# Patient Record
Sex: Male | Born: 1975 | Race: White | Hispanic: No | Marital: Married | Smoking: Former smoker
Health system: Southern US, Community
[De-identification: ages and names within clinical notes are randomized; demographics above are authoritative.]

## PROBLEM LIST (undated history)

## (undated) DIAGNOSIS — K4090 Unilateral inguinal hernia, without obstruction or gangrene, not specified as recurrent: Secondary | ICD-10-CM

## (undated) DIAGNOSIS — Z972 Presence of dental prosthetic device (complete) (partial): Secondary | ICD-10-CM

## (undated) DIAGNOSIS — K08109 Complete loss of teeth, unspecified cause, unspecified class: Secondary | ICD-10-CM

## (undated) DIAGNOSIS — F419 Anxiety disorder, unspecified: Secondary | ICD-10-CM

## (undated) DIAGNOSIS — M549 Dorsalgia, unspecified: Secondary | ICD-10-CM

## (undated) DIAGNOSIS — Z973 Presence of spectacles and contact lenses: Secondary | ICD-10-CM

## (undated) DIAGNOSIS — F319 Bipolar disorder, unspecified: Secondary | ICD-10-CM

## (undated) DIAGNOSIS — R7303 Prediabetes: Secondary | ICD-10-CM

## (undated) DIAGNOSIS — K219 Gastro-esophageal reflux disease without esophagitis: Secondary | ICD-10-CM

## (undated) DIAGNOSIS — R569 Unspecified convulsions: Secondary | ICD-10-CM

## (undated) HISTORY — PX: ABDOMINAL SURGERY: SHX537

---

## 2001-09-21 ENCOUNTER — Emergency Department (HOSPITAL_COMMUNITY): Admission: EM | Admit: 2001-09-21 | Discharge: 2001-09-21 | Payer: Self-pay | Admitting: Emergency Medicine

## 2002-12-30 ENCOUNTER — Emergency Department (HOSPITAL_COMMUNITY): Admission: EM | Admit: 2002-12-30 | Discharge: 2002-12-30 | Payer: Self-pay | Admitting: Emergency Medicine

## 2007-02-04 ENCOUNTER — Emergency Department (HOSPITAL_COMMUNITY): Admission: EM | Admit: 2007-02-04 | Discharge: 2007-02-04 | Payer: Self-pay | Admitting: Emergency Medicine

## 2007-03-20 ENCOUNTER — Emergency Department (HOSPITAL_COMMUNITY): Admission: EM | Admit: 2007-03-20 | Discharge: 2007-03-20 | Payer: Self-pay | Admitting: Emergency Medicine

## 2007-03-22 ENCOUNTER — Inpatient Hospital Stay (HOSPITAL_COMMUNITY): Admission: AD | Admit: 2007-03-22 | Discharge: 2007-03-29 | Payer: Self-pay | Admitting: Psychiatry

## 2007-03-22 ENCOUNTER — Ambulatory Visit: Payer: Self-pay | Admitting: Psychiatry

## 2007-03-23 ENCOUNTER — Other Ambulatory Visit: Payer: Self-pay | Admitting: Emergency Medicine

## 2007-06-19 ENCOUNTER — Emergency Department (HOSPITAL_COMMUNITY): Admission: EM | Admit: 2007-06-19 | Discharge: 2007-06-19 | Payer: Self-pay | Admitting: Emergency Medicine

## 2007-08-12 ENCOUNTER — Other Ambulatory Visit: Payer: Self-pay | Admitting: Emergency Medicine

## 2007-08-12 ENCOUNTER — Other Ambulatory Visit: Payer: Self-pay

## 2007-08-13 ENCOUNTER — Inpatient Hospital Stay (HOSPITAL_COMMUNITY): Admission: EM | Admit: 2007-08-13 | Discharge: 2007-08-16 | Payer: Self-pay | Admitting: *Deleted

## 2007-08-14 ENCOUNTER — Ambulatory Visit: Payer: Self-pay | Admitting: *Deleted

## 2007-09-11 ENCOUNTER — Inpatient Hospital Stay (HOSPITAL_COMMUNITY): Admission: AD | Admit: 2007-09-11 | Discharge: 2007-09-15 | Payer: Self-pay | Admitting: *Deleted

## 2007-09-11 ENCOUNTER — Other Ambulatory Visit: Payer: Self-pay

## 2007-09-11 ENCOUNTER — Other Ambulatory Visit: Payer: Self-pay | Admitting: Emergency Medicine

## 2007-09-28 HISTORY — PX: EXPLORATORY LAPAROTOMY: SUR591

## 2007-09-28 HISTORY — PX: ABDOMINAL SURGERY: SHX537

## 2007-10-23 ENCOUNTER — Emergency Department (HOSPITAL_COMMUNITY): Admission: EM | Admit: 2007-10-23 | Discharge: 2007-10-24 | Payer: Self-pay | Admitting: Emergency Medicine

## 2007-10-24 ENCOUNTER — Inpatient Hospital Stay (HOSPITAL_COMMUNITY): Admission: RE | Admit: 2007-10-24 | Discharge: 2007-10-27 | Payer: Self-pay | Admitting: *Deleted

## 2007-10-24 ENCOUNTER — Ambulatory Visit: Payer: Self-pay | Admitting: *Deleted

## 2007-12-26 ENCOUNTER — Emergency Department (HOSPITAL_COMMUNITY): Admission: EM | Admit: 2007-12-26 | Discharge: 2007-12-26 | Payer: Self-pay | Admitting: Emergency Medicine

## 2008-08-13 ENCOUNTER — Inpatient Hospital Stay (HOSPITAL_COMMUNITY): Admission: EM | Admit: 2008-08-13 | Discharge: 2008-08-19 | Payer: Self-pay | Admitting: Emergency Medicine

## 2008-09-16 ENCOUNTER — Ambulatory Visit: Payer: Self-pay | Admitting: Psychiatry

## 2008-09-16 ENCOUNTER — Inpatient Hospital Stay (HOSPITAL_COMMUNITY): Admission: AD | Admit: 2008-09-16 | Discharge: 2008-09-22 | Payer: Self-pay | Admitting: Psychiatry

## 2009-03-27 ENCOUNTER — Emergency Department (HOSPITAL_COMMUNITY): Admission: EM | Admit: 2009-03-27 | Discharge: 2009-03-27 | Payer: Self-pay | Admitting: Emergency Medicine

## 2009-04-18 ENCOUNTER — Emergency Department (HOSPITAL_COMMUNITY): Admission: EM | Admit: 2009-04-18 | Discharge: 2009-04-19 | Payer: Self-pay | Admitting: Emergency Medicine

## 2009-04-30 ENCOUNTER — Emergency Department (HOSPITAL_COMMUNITY): Admission: AC | Admit: 2009-04-30 | Discharge: 2009-05-01 | Payer: Self-pay

## 2009-05-01 ENCOUNTER — Ambulatory Visit: Payer: Self-pay | Admitting: Psychiatry

## 2009-05-01 ENCOUNTER — Inpatient Hospital Stay (HOSPITAL_COMMUNITY): Admission: AD | Admit: 2009-05-01 | Discharge: 2009-05-05 | Payer: Self-pay | Admitting: Psychiatry

## 2009-05-09 ENCOUNTER — Emergency Department (HOSPITAL_COMMUNITY): Admission: EM | Admit: 2009-05-09 | Discharge: 2009-05-09 | Payer: Self-pay | Admitting: Emergency Medicine

## 2010-05-24 ENCOUNTER — Emergency Department: Payer: Self-pay | Admitting: Internal Medicine

## 2010-06-04 ENCOUNTER — Emergency Department (HOSPITAL_COMMUNITY): Admission: EM | Admit: 2010-06-04 | Discharge: 2010-06-05 | Payer: Self-pay | Admitting: Emergency Medicine

## 2010-06-05 ENCOUNTER — Other Ambulatory Visit: Payer: Self-pay | Admitting: Emergency Medicine

## 2010-06-05 ENCOUNTER — Inpatient Hospital Stay (HOSPITAL_COMMUNITY): Admission: RE | Admit: 2010-06-05 | Discharge: 2010-06-09 | Payer: Self-pay | Admitting: Psychiatry

## 2010-06-05 ENCOUNTER — Ambulatory Visit: Payer: Self-pay | Admitting: Psychiatry

## 2010-11-07 ENCOUNTER — Emergency Department (HOSPITAL_COMMUNITY)
Admission: EM | Admit: 2010-11-07 | Discharge: 2010-11-07 | Disposition: A | Discharge status: Home / Self Care | Payer: Medicaid Other | Attending: Emergency Medicine | Admitting: Emergency Medicine

## 2010-11-07 DIAGNOSIS — K089 Disorder of teeth and supporting structures, unspecified: Secondary | ICD-10-CM | POA: Insufficient documentation

## 2010-11-07 DIAGNOSIS — F411 Generalized anxiety disorder: Secondary | ICD-10-CM | POA: Insufficient documentation

## 2010-11-07 DIAGNOSIS — F319 Bipolar disorder, unspecified: Secondary | ICD-10-CM | POA: Insufficient documentation

## 2010-11-07 DIAGNOSIS — G40909 Epilepsy, unspecified, not intractable, without status epilepticus: Secondary | ICD-10-CM | POA: Insufficient documentation

## 2010-12-10 LAB — BASIC METABOLIC PANEL
Chloride: 102 mEq/L (ref 96–112)
GFR calc Af Amer: >60 mL/min (ref >60)
GFR calc non Af Amer: >60 mL/min (ref >60)

## 2010-12-10 LAB — CBC
HCT: 43.9 % (ref 39.0–52.0)
HCT: 47.8 % (ref 39.0–52.0)
Hemoglobin: 15.3 g/dL (ref 13.0–17.0)
MCHC: 34.9 g/dL (ref 30.0–36.0)
MCHC: 35 g/dL (ref 30.0–36.0)
MCV: 96.2 fL (ref 78.0–100.0)
Platelets: 266 10*3/uL (ref 150–400)
RDW: 13.9 % (ref 11.5–15.5)

## 2010-12-10 LAB — RAPID URINE DRUG SCREEN, HOSP PERFORMED
Amphetamines: NOT DETECTED
Barbiturates: POSITIVE — AB
Benzodiazepines: POSITIVE — AB
Cocaine: NOT DETECTED
Cocaine: NOT DETECTED
Tetrahydrocannabinol: NOT DETECTED

## 2010-12-10 LAB — POCT I-STAT, CHEM 8
BUN: 13 mg/dL (ref 6–23)
Calcium, Ion: 1.15 mmol/L (ref 1.12–1.32)
Chloride: 103 mEq/L (ref 96–112)
Glucose, Bld: 77 mg/dL (ref 70–99)
Hemoglobin: 15.6 g/dL (ref 13.0–17.0)
TCO2: 26 mmol/L (ref 0–100)

## 2010-12-10 LAB — DIFFERENTIAL
Basophils Absolute: 0 10*3/uL (ref 0.0–0.1)
Basophils Relative: 0 % (ref 0–1)
Eosinophils Absolute: 0.2 10*3/uL (ref 0.0–0.7)
Lymphocytes Relative: 19 % (ref 12–46)
Lymphs Abs: 1.7 10*3/uL (ref 0.7–4.0)
Monocytes Absolute: 0.6 10*3/uL (ref 0.1–1.0)
Neutro Abs: 6.4 10*3/uL (ref 1.7–7.7)
Neutrophils Relative %: 72 % (ref 43–77)

## 2010-12-10 LAB — ETHANOL: Alcohol, Ethyl (B): <5 mg/dL (ref 0–10)

## 2011-01-02 LAB — CBC
HCT: 41.9 % (ref 39.0–52.0)
RBC: 4.51 MIL/uL (ref 4.22–5.81)
RDW: 13.1 % (ref 11.5–15.5)

## 2011-01-02 LAB — RAPID URINE DRUG SCREEN, HOSP PERFORMED
Amphetamines: NOT DETECTED
Tetrahydrocannabinol: NOT DETECTED

## 2011-01-02 LAB — BASIC METABOLIC PANEL
BUN: 3 mg/dL — ABNORMAL LOW (ref 6–23)
Calcium: 8.9 mg/dL (ref 8.4–10.5)
GFR calc non Af Amer: >60 mL/min (ref >60)
Sodium: 138 mEq/L (ref 135–145)

## 2011-02-09 NOTE — H&P (Signed)
[name], [name]:  [id number]   MEDICAL RECORD NO.:  [id number]          PATIENT TYPE:  IPS   LOCATION:  0305                          FACILITY:  [facility]   PHYSICIAN:  Jasmine Pang, M.D. DATE OF BIRTH:  21-Oct-1975   DATE OF ADMISSION:  10/24/2007  DATE OF DISCHARGE:                       PSYCHIATRIC ADMISSION ASSESSMENT   This a 35 year old male voluntarily admitted on October 24, 2007.   HISTORY OF PRESENT ILLNESS:  The patient reports feeling very depressed  with suicidal thoughts.  States he was going to kill himself with his  gun.  He does have access to guns.  He states that he has also been  drinking, history of some binge drinking with his last drink 2 days ago.  He states one of his current stressors is he is homeless, and he has had  some noncompliance with his medications.   PAST PSYCHIATRIC HISTORY:  The patient was here prior in December 2008  for similar-type symptoms.  He also reports a history of cutting.  He  sees Dr. Mady Haagensen for outpatient mental health services.   SOCIAL HISTORY:  A 35 year old separated male, has one child.  He  considers himself homeless at this time.  He is unemployed.   FAMILY HISTORY:  None.   ALCOHOL AND DRUG HISTORY:  The patient smokes, and alcohol habits as  described above.  Denies any drug use.   PRIMARY CARE Ivis Nicolson:  Dr. Herbert Moors.   MEDICAL PROBLEMS:  No known medical problems.   MEDICATIONS:  1. Depakote 1000 mg at bedtime.  2. Prozac 20 mg daily.  3. Trazodone 50 mg at bedtime.  4. Seroquel 50 every 4 hours p.r.n.   DRUG ALLERGIES:  BUSPAR, states he breaks out.   PHYSICAL EXAM:  The patient was assessed at [facility], which was  reviewed and indicates a healthy physical exam.  He did receive Ativan 2  mg and ibuprofen 600 mg in the ED.   LABORATORY DATA:  His hemoglobin 14.4 with a hematocrit of 40.8.  His  BMET is within normal limits.  Alcohol level less than 5.  Urinalysis is  negative.  Urine drug screen is negative.   His temperature is 98.1, 91 heart rate, 20 respirations, blood pressure  120/81.  This is a healthy-appearing male, sleepy, in the bed with the seizure  pads in place.  Speech is soft-spoken.  Had to be aroused to answer questions.  Thought  process:  He is endorsing suicidal thoughts and promises safety.  Denies  any hallucinations.  His answers are appropriate.  Cognitive function:  He seems aware of his self and situation.  Memory is fair.  Judgment and  insight are fair.   AXIS I:  1.  Mood disorder.  2.  Alcohol abuse.  AXIS II:  Deferred.  AXIS III:  No known medical problems.  AXIS IV:  Problems with housing, other psychosocial problems. occupation  and primary support group.  AXIS V:  Current is 35.   PLAN:  To detox the patient and work on relapse prevention.  We will  reinforce medication compliance.  We will obtain a Depakote level.  We  will discontinue his BuSpar as he reports an adverse reaction and will  make Seroquel p.r.n. at this time.  Case manager will assess his follow-  up and assess his living situation.  His tentative length of stay is 4-5  days.      Landry Corporal, N.P.      Jasmine Pang, M.D.  Electronically Signed    JO/MEDQ  D:  10/24/2007  T:  10/25/2007  Job:  [id number]
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
## 2011-02-09 NOTE — Consult Note (Signed)
NAMELOVETT, COFFIN               ACCOUNT NO.:  [id number]   MEDICAL RECORD NO.:  [id number]          PATIENT TYPE:  INP   LOCATION:  5003                         FACILITY:  [facility]   PHYSICIAN:  Antonietta Breach, M.D.  DATE OF BIRTH:  [birth year]   DATE OF CONSULTATION:  08/19/2008  DATE OF DISCHARGE:  08/19/2008                                 CONSULTATION   [name] is no longer having suicidal thoughts.  His mood has  returned to normal.  He has intact hope and future interests.  He is not  having any thoughts of harming others.  He has no hallucinations.   His orientation and memory function are intact.  He is cooperative with  bedside care and socially appropriate.   REVIEW OF SYSTEMS:  SKIN:  No rash from Lamictal.  GASTROINTESTINAL:  No  nausea from trazodone.   EXAMINATION:  VITAL SIGNS:  Temperature 98.0, pulse 82, respiratory rate  19, blood pressure 98/60, O2 saturation on room air 98%.   MENTAL STATUS EXAMINATION:  [name] is alert.  He is oriented to all  spheres.  His eye contact is good.  His concentration is normal.  His  affect is broad and appropriate.  Mood within normal limits.  Speech  involves normal rate and prosody without dysarthria.  Thought process is  logical, coherent, and goal-directed.  No looseness of association.  Thought content:  No thoughts of harming himself or others, no  delusions, no hallucinations.  Insight is intact, judgment is intact.   ASSESSMENT:  Axis I:  A.  293.83 Mood disorder, not otherwise specified.  B.  Rule out 296.80.  He does have bipolar disorder and when he ran of  the out of Klonopin his mood destabilized.  The Klonopin does help the  Lamictal in mood stabilization.  C.  293.84 Anxiety disorder, not otherwise specified, stable.   [name] is no longer at risk to harm himself after receiving  supportive care in the hospital.   The undersigned did recommend a voluntary admission to the psychiatric  hospital to  provide the most intensive care.  However, the patient  declined and he is no longer committable.   He is motivated for outpatient care.   RECOMMENDATION:  Regarding his medication, would continue on his  Lamictal and Klonopin dosage as his mood stabilizing regimen, monitoring  for any rash as a side effect of the Lamictal.  Would continue his  trazodone 150 mg q.h.s., the dose of Lamictal is 100 mg b.i.d., the dose  of Klonopin is 1 mg t.i.d.   [name] agrees to not drive if drowsy.  He agrees to call emergency  services immediately for any thoughts of harming himself or others, or  distress.   Would ask the social worker to set up psychiatric outpatient followup  for [name] as soon as possible after discharge.      Antonietta Breach, M.D.  Electronically Signed     JW/MEDQ  D:  11/30/2008  T:  11/30/2008  Job:  [id number]

## 2011-02-09 NOTE — Consult Note (Signed)
NAMESHEIKH, LEVERICH               ACCOUNT NO.:  [id number]   MEDICAL RECORD NO.:  [id number]          PATIENT TYPE:  INP   LOCATION:  5003                         FACILITY:  [facility]   PHYSICIAN:  Antonietta Breach, M.D.  DATE OF BIRTH:  03/28/1976   DATE OF CONSULTATION:  08/16/2008  DATE OF DISCHARGE:                                 CONSULTATION   [name] is continuing with mood lability and suicidal intent.  He is  very anxious.  He is not having hallucinations or delusions.  He is  cooperative with bedside care.   REVIEW OF SYSTEMS:  PSYCHIATRIC And SKIN:  [name] does have a  history of a trial on Remeron with a rash.  Prozac caused him to be very  irritable.   EXAMINATION:  VITAL SIGNS:  Temperature 98.5, pulse 87, respiratory rate  16, blood pressure 121/70, O2 saturation on room air 95%.   [name]' psychotropic agents are currently Klonopin 1 mg t.i.d.,  Lamictal 100 mg b.i.d. and Desyrel 150 mg every night.   MENTAL STATUS EXAM:  [name] has labile mood.  His affect is labile.  He is not combative.  His attention span is mildly decreased.  His eye  contact is intact.  Concentration mildly decreased.  He is oriented to  all spheres.  Memory is intact to immediate, recent and remote.  His  speech involves slight pressure at times.  There is no dysarthria.  Thought process is coherent.  Thought content:  Please see the history  above.  Insight is partial.  Judgment is impaired for functioning  outside the hospital.  However, it is intact for the capacity for  informed consent regarding psychiatric care.   ASSESSMENT:  AXIS I:  293.83 mood disorder not otherwise specified.  Rule out 296.80 bipolar disorder not otherwise specified.  293.84 anxiety disorder not otherwise specified.   [name] does continue to be at risk for suicide.   RECOMMENDATIONS:  1. Continue suicide precautions.  2. Would admit to an inpatient psychiatric unit as soon as possible.  3. Low-stimulation ego support.  4. Continue his psychotropic medication as above, monitoring for any      excessive sedation due to the Klonopin and Desyrel.      Antonietta Breach, M.D.  Electronically Signed    JW/MEDQ  D:  08/19/2008  T:  08/19/2008  Job:  [id number]
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
## 2011-02-09 NOTE — H&P (Signed)
[name], [name]:  [id number]   MEDICAL RECORD NO.:  [id number]          PATIENT TYPE:  IPS   LOCATION:  0600                          FACILITY:  [facility]   PHYSICIAN:  Jasmine Pang, M.D. DATE OF BIRTH:  08-09-1976   DATE OF ADMISSION:  09/11/2007  DATE OF DISCHARGE:                       PSYCHIATRIC ADMISSION ASSESSMENT   IDENTIFYING INFORMATION:  A 35 year old married white male who was  admitted on a voluntary basis on September 12, 2007.   HISTORY OF PRESENT ILLNESS:  The patient states he was having suicidal  thoughts with the plan.  He presented to [facility] ED with the chief  complaint of suicidal ideation.  He reports he has had suicidal ideation  for the past 2-3 weeks since he ran out of his Lamictal.  He states his  suicidal ideation has worsened over the past 2-3 days.  He reports  episodes of blacking out.  He states he cannot remember some of the  things he does.  He reports increased depression recently.  He has not  been able to see his son, which has his increased his level of  depression.  He is unsure of the custody situation regarding his son.  He currently lives with the patient's parents.  He had begun to have  thoughts of cutting himself and then decided he would use something to  cut himself to death.  This is the second [facility] admission to this  facility.  The patient was here 1 month ago.  He has also been at rehab  at [facility] approximately 4 years ago.  He sees Dr. Mady [name] at [facility], that is the past psychiatric history starting when  the patient was here 1 month ago.   SOCIAL HISTORY:  The patient is married.  He has one son and two  stepsons.  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  He  states his wife is supportive.  His biologic son lives with his parents.   FAMILY HISTORY:  The patient denies family history other than mild  anxiety with father.   SUBSTANCE ABUSE:  Positive alcohol  use, a fifth per day.  He denies  current street drugs or other drug abuse.  No IV drug use.   MEDICAL PROBLEMS:  The patient has a seizure disorder.  He denies any  others.   MEDICATIONS:  Lamictal 200 mg p.o. b.i.d.  He was on the clonidine  patch, but states this did not help him.   DRUG ALLERGIES:  IBUPROFEN AND ZYPREXA (CAUSES RASH).   PHYSICAL FINDINGS:  Complete physical exam was done in the ED prior to  admission and there were no acute physical or medical problems noted.   ADMISSION LABORATORIES:  Urinalysis was negative.  Urine drug screen was  negative.  CBC was within normal limits.  Alcohol level was less than 5.  Basic metabolic panel was within normal limits.   MENTAL STATUS EXAM:  The patient was alert and oriented x3.  He was  somewhat disheveled, but had good eye contact.  Speech was soft and slow  with good articulate.  There was psychomotor retardation.  Mood was  depressed and anxious.  Affect was consistent with mood.  Positive  suicidal ideation is as indicated in the history of present illness.  No  homicidal ideation.  Positive thoughts of self-injurious behavior as  indicated in the history of present illness.  No auditory or visual  hallucinations.  No paranoia or delusions.  Thoughts were logical and  goal-directed.  Thought content, no predominant theme.  Cognitive was  grossly intact.   ADMISSION DIAGNOSES:  AXIS I:  Mood disorder, not otherwise specified.  Alcohol dependence.  AXIS II:  Deferred.  AXIS III:  Seizures.  AXIS IV:  Global assessment of functioning is 40 to 45 upon admission.  Global assessment of functioning highest past year was 60-65.   TREATMENT PLAN:  The patient will start BuSpar 5 mg p.o. t.i.d. since he  states this has helped his anxiety before.  We will not restart the  clonidine.  He will start Prozac 20 mg daily since he states this has  been helpful for his anxiety and depression in the past.  We will  restart Lamictal  for his seizure disorder and mood instability.  We will  begin Vistaril 25 mg p.o. q.4 h. p.r.n. anxiety.  We will begin unit  therapeutic groups and activities.  The patient will be in the red  (chemical dependence) groups.  He anticipated needing to stay 3-5 days.   POSTHOSPITAL CARE PLAN:  The patient will return home to live with his  wife in [place].  He will continue to see Dr. Mady [name] at the  [facility].      Jasmine Pang, M.D.  Electronically Signed     BHS/MEDQ  D:  09/12/2007  T:  09/13/2007  Job:  [id number]
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
## 2011-02-09 NOTE — Discharge Summary (Signed)
Steven, [name]:  [id number]   MEDICAL RECORD NO.:  [id number]          PATIENT TYPE:  IPS   LOCATION:  0600                          FACILITY:  BH   PHYSICIAN:  Jasmine Pang, M.D. DATE OF BIRTH:  1976/01/17   DATE OF ADMISSION:  09/11/2007  DATE OF DISCHARGE:  09/15/2007                               DISCHARGE SUMMARY   IDENTIFYING INFORMATION:  The patient is a 35 year old married white  male who was admitted on a voluntary basis on September 12, 2007.   HISTORY OF PRESENT ILLNESS:  The patient states he was having suicidal  thoughts with a plan.  He presented to [facility] ED with chief  complaint of suicidal ideation.  He reports that he has had suicidal  ideation for the past 2 to 3 weeks since he ran out of his Lamictal.  He  states his suicidal ideation has worsened over the past 2 to 3 days.  He  reports episodes of blacking out.  He states he cannot remember some of  the things he does.  He reports increased depression recently.  He has  not been able to see his son, which has increased his level of  depression.  He is unsure as to the crux of the situation regarding his  son.  He currently lives with the patient's parents.  He had begun to  have thoughts of cutting himself and then decided he would use something  to cut himself to death.  This is the second [facility] admission for this  young man.  The patient was here 1 month ago.  He has also been at rehab  at [facility] approximately 4 years ago.  He sees Dr. Mady Haagensen at [facility].   The patient denies family history other than mild anxiety with father.   The patient admits to alcohol use of 6 per day.  He denies current  street drugs or drug abuse.  No IV use.   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 Sandria Manly.   He is supposed to be on Lamictal 200 mg p.o. b.i.d. but he took himself  off this 1 to 2 weeks ago.  He was on  a clonidine patch 0.1 mg daily but  states that he has discontinued this because it did not help him with  his anxiety.   He is allergic to IBUPROFEN AND ZYPREXA.   PHYSICAL FINDINGS:  A complete physical exam was done in the ED prior to  admission.  There were no acute physical or medical problems noted.   ADMISSION LABORATORY DATA:  Urinalysis was negative.  Urine drug screen  was negative.  CBC was within normal limits.  Basic metabolic panel was  within normal limits.  Alcohol level was less than 5.   HOSPITAL COURSE:  Upon admission, the patient was started on Seroquel 50  mg p.o. q.4 h p.r.n. anxiety and agitation and trazodone 50 mg p.o.  q.h.s. p.r.n.; may repeat x1 if needed.  On September 12, 2007, he was  started on BuSpar 5 mg p.o. t.i.d., Prozac 20 mg daily, and Vistaril 25  mg q.4 h p.r.n. anxiety.  He was also started on the Librium detox  protocol with 50 mg loading dose.  Also on September 12, 2007, the  patient was started on Depakote ER 1000 mg p.o. q.h.s.  He was initially  started on Lamictal 25 mg every other day but then this was discontinued  because he states his neurologist had wanted to take him off it.  On  September 13, 2007, BuSpar was increased to 10 mg p.o. t.i.d.  The  patient tolerated these medications well.  There was some sedation, but  otherwise, there were no adverse reactions.  The patient was friendly  and cooperative in sessions with me; he was able to participate  appropriately in unit therapeutic groups and activities.  He stated he  had not restarted drinking but did become suicidal, which led to him  wanting to be back in the hospital.  He states he has not been able to  see his son for a while, which is stressful especially during the  holidays.  His son lives with his parents and he is not sure why his  father especially does not want him to see his son at this point.  He  denied it was because of his drug abuse.  He states he had begun to  have  suicidal thoughts of cutting himself.   As hospitalization progressed, the patient became less depressed and  then less anxious.  There was no suicidal or homicidal ideation.  He  discussed his wife and the support from her.  He did not take his  Depakote initially because he was not sure why he was supposed to be on  it.  I explained that this was for seizure precautions, since we could  not restart the Lamictal at his high doses that he had been on.  As it  is, the Lamictal was being weaned off anyway.  The patient continued to  improve on September 15, 2007.   Mental status had improved markedly from admission status.  The patient  was friendly and cooperative with good eye contact.  Speech was normal  rate and flow.  Psychomotor activity was within normal limits.  The  patient's sleep was good.  His appetite was good.  Mood was less  depressed and less anxious than upon admission.  Affect was consistent  with mood.  There was no suicidal or homicidal ideation.  No auditory or  visual hallucinations.  He has got some conflict with his father about  seeing his son but feels he is able to safely be discharged today.   DISCHARGE DIAGNOSES:  AXIS I: Mood disorder, not otherwise specified;  alcohol dependence.  AXIS II: None.  AXIS III:.  Seizures.  AXIS IV: Global Assessment of functioning was 50 upon discharge; Global  Assessment of functioning was 40 to 45 upon admission; Global Assessment  of functioning highest in the past year was 60 to 65.   DISCHARGE PLANS:  There were no specific activity level or dietary  restrictions.  The patient will see Dr. Mady Haagensen at [facility].  He will go for a re-entry appointment on September 18, 2007, at 9 a.m.   DISCHARGE MEDICATIONS:  1. Prozac 20 mg p.o. daily.  2. Depakote ER 1000 mg p.o. q.h.s.  3. BuSpar 10 mg p.o. t.i.d.  4. Trazodone 50 mg p.o. q.h.s.  5. Seroquel 50 mg p.o. q.4 h p.r.n. anxiety.       Jasmine Pang, M.D.  Electronically Signed     BHS/MEDQ  D:  09/15/2007  T:  09/15/2007  Job:  [id number]
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
## 2011-02-12 NOTE — Discharge Summary (Signed)
NAMEEMMERICH, CRYER NO.:  [id number]   MEDICAL RECORD NO.:  [id number]          PATIENT TYPE:  IPS   LOCATION:  0305                          FACILITY:  [facility]   PHYSICIAN:  Jasmine Pang, M.D. DATE OF BIRTH:  Aug 05, 1976   DATE OF ADMISSION:  10/24/2007  DATE OF DISCHARGE:  10/27/2007                               DISCHARGE SUMMARY   IDENTIFICATION:  This is a 35 year old married white male who was  admitted on a voluntary basis on October 24, 2007.   HISTORY OF PRESENT ILLNESS:  The patient reports feeling very depressed  with suicidal thoughts.  He states he is going to kill himself with his  gun.  He does have access to guns.  He states that he has been drinking.  He has a history of some binge drinking with his last drink 2 days ago.  He states one of his current stressors is that he is homeless.  He is  having conflict with his wife and has been noncompliant with his  medications.   PAST PSYCHIATRIC HISTORY:  The patient was here in December 2008 for  similar symptoms.  He also reports a history of cutting on himself.  He  sees Dr. Mady Haagensen for outpatient mental health services.   FAMILY HISTORY:  None.   SUBSTANCE ABUSE HISTORY:  The patient smokes and has alcohol habits as  described above.  He denies any drug use.   MEDICAL PROBLEMS:  No known medical problems.   MEDICATIONS:  1. Depakote 1000 mg p.o. q.h.s.  2. Prozac 20 mg daily.  3. Trazodone 50 mg at bedtime.  4. Seroquel 50 mg every 4 hours p.r.n.   DRUG ALLERGIES:  No known drug allergies.   PHYSICAL FINDINGS:  The patient was assessed at [facility], which was  reviewed and indicates a healthy physical exam.   ADMISSION LABORATORIES:  The CBC was grossly within normal limits.  Basic metabolic panel was within normal limits.  Calcium was 8.7.  Alcohol level was less than 5.  Urinalysis negative.  UDS negative.  TSH  was within normal limits at 0.826.  Depakote level was 102.3  (50-100).   HOSPITAL COURSE:  Upon admission, the patient was placed on Depakote  1000 mg p.o. q.h.s., BuSpar 10 mg p.o. t.i.d., Prozac 20 mg daily, and  Seroquel 50 mg p.o. q.i.d.  He was also placed on trazodone 50 mg p.o.  q.h.s. p.r.n. insomnia, may repeat x1 if needed.  He was placed on  [facility] detox protocol.  On October 24, 2007, BuSpar was discontinued.  He was placed on Seroquel 50 mg p.o. q.i.d. p.r.n. anxiety.  In  individual sessions with me, the patient was initially sleepy, but  cooperative.  He was somewhat resistant to participating in unit  therapeutic groups and activities.  He stated I messed up and started  drinking again.  He states he feels depressed.  He began to feel  suicidal and was going to get a gun.  He then stopped to let a friend  know how he was feeling and then went to the  [facility] ED, who sent  him here.  He states he has a lot of conflicts with his wife.  He began  to drink a few weeks ago and was drinking up to 1 pint of vodka per day,  occasionally with a few beers.  The patient continued to be depressed  and anxious.  He continued to discuss conflict with his wife and did not  feel he could go home because of the problems they were having.  He felt  the Prozac was not helping and this was discontinued, instead he was  started on Celexa 20 mg p.o. q.a.m.  The patient tolerated these  medications well with no significant side effects.  He continued to talk  about having suicidal ideation a bunch of thoughts running through his  head.  His wife came to visit and he states this went well, although he  feels they will separate for a while.  On October 27, 2007, mental  status had improved markedly from admission status.  The patient was  less depressed and less anxious.  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  It was felt he was a detriment and  hazard to the other patients on the unit.  There was no suicidal or   homicidal ideation.  No thoughts of self-injurious behavior.  No  auditory or visual hallucinations.  No paranoia or delusions.  Thoughts  were logical and goal-directed.  Thought content, no predominant theme.  Cognitive was grossly back to baseline.  It was felt that the patient  was safe for discharge and was not benefiting from the hospitalization.   DISCHARGE DIAGNOSES:  Axis I:  Mood disorder, not otherwise specified;  also alcohol abuse.  Axis II:  None.  Axis III:  No known medical problems.  Axis IV:  Severe (problems with housing, other psychosocial problems,  occupation and primary support group, burden of psychiatric illness and  chemical dependence illness).  Axis V:  Global assessment of functioning is 48 at discharge.  GAF was  35 upon admission.  GAF highest past year was 60-65.   DISCHARGE PLANS:  There were no specific activity level or dietary  restrictions.   POSTHOSPITAL CARE PLANS:  The patient will go to the [facility] on October 30, 2007.   DISCHARGE MEDICATIONS:  1. Depakote ER 1000 mg p.o. q.h.s.  2. Celexa 20 mg p.o. q. day.      Jasmine Pang, M.D.  Electronically Signed     [facility]/MEDQ  D:  11/18/2007  T:  11/18/2007  Job:  [id number]

## 2011-06-12 ENCOUNTER — Emergency Department (HOSPITAL_COMMUNITY)
Admission: EM | Admit: 2011-06-12 | Discharge: 2011-06-13 | Disposition: A | Discharge status: Home / Self Care | Payer: Medicaid Other | Attending: Emergency Medicine | Admitting: Emergency Medicine

## 2011-06-12 DIAGNOSIS — M545 Low back pain, unspecified: Secondary | ICD-10-CM | POA: Insufficient documentation

## 2011-06-12 DIAGNOSIS — S0993XA Unspecified injury of face, initial encounter: Secondary | ICD-10-CM | POA: Insufficient documentation

## 2011-06-12 DIAGNOSIS — G40909 Epilepsy, unspecified, not intractable, without status epilepticus: Secondary | ICD-10-CM | POA: Insufficient documentation

## 2011-06-12 DIAGNOSIS — F411 Generalized anxiety disorder: Secondary | ICD-10-CM | POA: Insufficient documentation

## 2011-06-12 DIAGNOSIS — F319 Bipolar disorder, unspecified: Secondary | ICD-10-CM | POA: Insufficient documentation

## 2011-06-12 DIAGNOSIS — R079 Chest pain, unspecified: Secondary | ICD-10-CM | POA: Insufficient documentation

## 2011-06-12 DIAGNOSIS — F1010 Alcohol abuse, uncomplicated: Secondary | ICD-10-CM | POA: Insufficient documentation

## 2011-06-12 DIAGNOSIS — Z79899 Other long term (current) drug therapy: Secondary | ICD-10-CM | POA: Insufficient documentation

## 2011-06-12 DIAGNOSIS — G8929 Other chronic pain: Secondary | ICD-10-CM | POA: Insufficient documentation

## 2011-06-13 ENCOUNTER — Emergency Department (HOSPITAL_COMMUNITY): Payer: Medicaid Other

## 2011-06-13 LAB — POCT I-STAT, CHEM 8
Calcium, Ion: 1.03 mmol/L — ABNORMAL LOW (ref 1.12–1.32)
Creatinine, Ser: 1.3 mg/dL (ref 0.50–1.35)
Hemoglobin: 18 g/dL — ABNORMAL HIGH (ref 13.0–17.0)
Sodium: 147 mEq/L — ABNORMAL HIGH (ref 135–145)
TCO2: 27 mmol/L (ref 0–100)

## 2011-06-17 LAB — CBC
Hemoglobin: 14.4
MCHC: 35.2
MCV: 89.5
RBC: 4.56
WBC: 10.3

## 2011-06-17 LAB — COMPREHENSIVE METABOLIC PANEL
AST: 14
BUN: 6
CO2: 32
Calcium: 8.4
Creatinine, Ser: 0.74
GFR calc Af Amer: >60
GFR calc non Af Amer: >60
Total Bilirubin: 0.9

## 2011-06-17 LAB — ETHANOL: Alcohol, Ethyl (B): <5

## 2011-06-17 LAB — TSH: TSH: 0.826

## 2011-06-17 LAB — VALPROIC ACID LEVEL: Valproic Acid Lvl: 102.3 — ABNORMAL HIGH

## 2011-06-17 LAB — RAPID URINE DRUG SCREEN, HOSP PERFORMED
Barbiturates: NOT DETECTED
Opiates: NOT DETECTED

## 2011-06-17 LAB — DIFFERENTIAL
Basophils Relative: 1
Eosinophils Absolute: 0.3
Lymphs Abs: 2.7
Monocytes Absolute: 0.6
Monocytes Relative: 6
Neutro Abs: 6.6
Neutrophils Relative %: 64

## 2011-06-17 LAB — URINALYSIS, ROUTINE W REFLEX MICROSCOPIC
Bilirubin Urine: NEGATIVE
Glucose, UA: NEGATIVE
Hgb urine dipstick: NEGATIVE
Specific Gravity, Urine: 1.008
pH: 7

## 2011-06-17 LAB — BASIC METABOLIC PANEL
CO2: 30
Chloride: 99
Creatinine, Ser: 0.73
GFR calc Af Amer: >60
Sodium: 138

## 2011-06-21 LAB — CBC
HCT: 43.1
Hemoglobin: 15.1
MCV: 91.3
RBC: 4.71
WBC: 8.9

## 2011-06-21 LAB — DIFFERENTIAL
Eosinophils Absolute: 0.3
Eosinophils Relative: 3
Lymphocytes Relative: 27
Lymphs Abs: 2.4
Monocytes Relative: 6

## 2011-06-21 LAB — ETHANOL: Alcohol, Ethyl (B): <5

## 2011-06-21 LAB — URINALYSIS, ROUTINE W REFLEX MICROSCOPIC
Glucose, UA: NEGATIVE
Hgb urine dipstick: NEGATIVE
Protein, ur: NEGATIVE
Specific Gravity, Urine: 1.006
pH: 7

## 2011-06-21 LAB — BASIC METABOLIC PANEL
Chloride: 107
GFR calc Af Amer: >60
Potassium: 3.4 — ABNORMAL LOW
Sodium: 141

## 2011-06-21 LAB — RAPID URINE DRUG SCREEN, HOSP PERFORMED
Benzodiazepines: POSITIVE — AB
Opiates: NOT DETECTED

## 2011-06-29 LAB — POCT I-STAT, CHEM 8
BUN: 5 — ABNORMAL LOW
Creatinine, Ser: 1
Glucose, Bld: 91
Hemoglobin: 15
Potassium: 3.7
TCO2: 27

## 2011-07-02 LAB — URINALYSIS, ROUTINE W REFLEX MICROSCOPIC
Hgb urine dipstick: NEGATIVE
Nitrite: NEGATIVE
Protein, ur: NEGATIVE
Specific Gravity, Urine: 1.002 — ABNORMAL LOW
Urobilinogen, UA: 0.2

## 2011-07-02 LAB — HEPATIC FUNCTION PANEL
AST: 26
Alkaline Phosphatase: 48
Bilirubin, Direct: 0.1
Total Bilirubin: 0.8

## 2011-07-02 LAB — RAPID URINE DRUG SCREEN, HOSP PERFORMED
Amphetamines: NOT DETECTED
Barbiturates: NOT DETECTED
Benzodiazepines: NOT DETECTED

## 2011-07-02 LAB — BASIC METABOLIC PANEL
BUN: <1 — ABNORMAL LOW
Calcium: 9.1
Chloride: 102
Creatinine, Ser: 0.88
GFR calc Af Amer: >60
GFR calc non Af Amer: >60

## 2011-07-02 LAB — CBC
Platelets: 295
RBC: 4.65
WBC: 10.8 — ABNORMAL HIGH

## 2011-07-02 LAB — VALPROIC ACID LEVEL: Valproic Acid Lvl: 108.7 — ABNORMAL HIGH

## 2011-07-02 LAB — ETHANOL: Alcohol, Ethyl (B): <5

## 2011-07-06 LAB — DRUGS OF ABUSE SCREEN W/O ALC, ROUTINE URINE
Barbiturate Quant, Ur: NEGATIVE
Benzodiazepines.: NEGATIVE
Cocaine Metabolites: NEGATIVE
Creatinine,U: 99.9
Marijuana Metabolite: NEGATIVE
Methadone: NEGATIVE

## 2011-07-06 LAB — RAPID URINE DRUG SCREEN, HOSP PERFORMED
Barbiturates: NOT DETECTED
Benzodiazepines: NOT DETECTED
Cocaine: POSITIVE — AB

## 2011-07-06 LAB — URINALYSIS, ROUTINE W REFLEX MICROSCOPIC
Bilirubin Urine: NEGATIVE
Bilirubin Urine: NEGATIVE
Glucose, UA: NEGATIVE
Hgb urine dipstick: NEGATIVE
Ketones, ur: NEGATIVE
Ketones, ur: NEGATIVE
Protein, ur: NEGATIVE
Urobilinogen, UA: 0.2
pH: 8

## 2011-07-06 LAB — DIFFERENTIAL
Basophils Absolute: 0
Lymphocytes Relative: 22
Neutro Abs: 7.8 — ABNORMAL HIGH
Neutrophils Relative %: 69

## 2011-07-06 LAB — URINE MICROSCOPIC-ADD ON

## 2011-07-06 LAB — CBC
HCT: 44.7
MCV: 93.6
RBC: 4.78
WBC: 11.4 — ABNORMAL HIGH

## 2011-07-06 LAB — COMPREHENSIVE METABOLIC PANEL
BUN: 10
CO2: 24
Chloride: 105
Creatinine, Ser: 0.91
GFR calc non Af Amer: >60
Total Bilirubin: 1.3 — ABNORMAL HIGH

## 2011-07-08 LAB — BASIC METABOLIC PANEL WITH GFR
BUN: 1 — ABNORMAL LOW
CO2: 35 — ABNORMAL HIGH
Calcium: 9.6
Chloride: 104
Creatinine, Ser: 0.77
GFR calc Af Amer: >60
GFR calc non Af Amer: >60
Glucose, Bld: 91
Potassium: 4.3
Sodium: 143

## 2011-07-08 LAB — CBC
MCHC: 35
Platelets: 320
RDW: 12.9

## 2011-07-08 LAB — DIFFERENTIAL
Basophils Absolute: 0
Basophils Relative: 0
Eosinophils Absolute: 0.4
Eosinophils Relative: 3
Lymphocytes Relative: 20
Lymphs Abs: 2.2
Monocytes Absolute: 0.7
Monocytes Relative: 7
Neutro Abs: 7.6
Neutrophils Relative %: 70

## 2011-07-14 LAB — POCT I-STAT CREATININE
Creatinine, Ser: 1.2
Operator id: 284251

## 2011-07-14 LAB — I-STAT 8, (EC8 V) (CONVERTED LAB)
Acid-Base Excess: 2
Glucose, Bld: 105 — ABNORMAL HIGH
TCO2: 29
pCO2, Ven: 43.3 — ABNORMAL LOW
pH, Ven: 7.407 — ABNORMAL HIGH

## 2011-07-14 LAB — VALPROIC ACID LEVEL: Valproic Acid Lvl: 24.9 — ABNORMAL LOW

## 2013-08-10 ENCOUNTER — Emergency Department (HOSPITAL_COMMUNITY): Payer: Medicaid Other

## 2013-08-10 ENCOUNTER — Encounter (HOSPITAL_COMMUNITY): Payer: Self-pay | Admitting: Emergency Medicine

## 2013-08-10 ENCOUNTER — Emergency Department (HOSPITAL_COMMUNITY)
Admission: EM | Admit: 2013-08-10 | Discharge: 2013-08-11 | Disposition: A | Discharge status: Home / Self Care | Payer: Medicaid Other | Source: Home / Self Care | Attending: Emergency Medicine | Admitting: Emergency Medicine

## 2013-08-10 DIAGNOSIS — F10929 Alcohol use, unspecified with intoxication, unspecified: Secondary | ICD-10-CM

## 2013-08-10 HISTORY — DX: Unspecified convulsions: R56.9

## 2013-08-10 LAB — CBC
HCT: 47.9 % (ref 39.0–52.0)
Hemoglobin: 17.4 g/dL — ABNORMAL HIGH (ref 13.0–17.0)
MCH: 32.9 pg (ref 26.0–34.0)
MCHC: 36.3 g/dL — ABNORMAL HIGH (ref 30.0–36.0)
MCV: 90.5 fL (ref 78.0–100.0)
Platelets: 193 10*3/uL (ref 150–400)
RBC: 5.29 MIL/uL (ref 4.22–5.81)
RDW: 13.4 % (ref 11.5–15.5)
WBC: 10.1 10*3/uL (ref 4.0–10.5)

## 2013-08-10 MED ORDER — THIAMINE HCL 100 MG/ML IJ SOLN
100.0000 mg | Freq: Once | INTRAMUSCULAR | Status: AC
  Administered 2013-08-10: 100 mg via INTRAVENOUS
  Filled 2013-08-10: qty 2

## 2013-08-10 NOTE — ED Notes (Signed)
Bed: WU98 Expected date:  Expected time:  Means of arrival:  Comments: Bed 11, EMS, 82 M, Possible Seizure/ETOH

## 2013-08-10 NOTE — ED Notes (Addendum)
Per EMS pt.was picked up in a gas station who called and complaint of feeling the "aura of possible seizure ". Pt.claimed of drinking alcohol since this morning. No actual seizure activity reported/ nor noted . Alert and oriented x3. Denies any pain . Pt. Had history of seizure disorder.

## 2013-08-10 NOTE — ED Notes (Signed)
Patient transported to CT 

## 2013-08-10 NOTE — ED Provider Notes (Signed)
CSN: [id number]     Arrival date & time 08/10/13  2214 History   First MD Initiated Contact with Patient 08/10/13 2218     Chief Complaint  Patient presents with  . Alcohol Intoxication  . possible seizure    (Consider location/radiation/quality/duration/timing/severity/associated sxs/prior Treatment) HPI Pt is a 37yo male with hx of seizures for which he takes Lamictal, Klonopin, and trazodone.  BIB EMS for possible seizure and/or Etoh intoxication.  Pt reports he has been drinking beers all day, unsure how many but called EMS because he felt like he was going to have a seizure and reports he does not recall if he fell or hit his head but states he did have a seizure before EMS arrived. C/o severe left sided headache, neck pain and low back pain. Also c/o nausea.  HA is worse with lights.  States this is how he normally feels after a seizure. Last seizure was several months ago.  States his GF was with him but did not come to the ER. Denies recent illness. Denies chest pain, SOB, or abdominal pain. Denies other significant PMH.    Neurologist: Dr. Sandria Manly.  Past Medical History  Diagnosis Date  . Seizures    History reviewed. No pertinent past surgical history. History reviewed. No pertinent family history. History  Substance Use Topics  . Smoking status: Current Every Day Smoker -- 2.00 packs/day for 10 years  . Smokeless tobacco: Not on file  . Alcohol Use: Yes    Review of Systems  Constitutional: Positive for fatigue. Negative for fever and chills.  Eyes: Positive for photophobia. Negative for visual disturbance.  Cardiovascular: Negative for chest pain.  Gastrointestinal: Negative for nausea, vomiting, abdominal pain and diarrhea.  Musculoskeletal: Positive for back pain and neck pain. Negative for neck stiffness.  Neurological: Positive for seizures, weakness ( generalized), light-headedness and headaches. Negative for dizziness, tremors, speech difficulty and numbness.  All  other systems reviewed and are negative.    Allergies  Haloperidol and related  Home Medications   Current Outpatient Rx  Name  Route  Sig  Dispense  Refill  . clonazePAM (KLONOPIN) 1 MG tablet   Oral   Take 1 mg by mouth 2 (two) times daily as needed for anxiety (anxiety).         Marland Kitchen ibuprofen (ADVIL,MOTRIN) 200 MG tablet   Oral   Take 400 mg by mouth every 6 (six) hours as needed.         . lamoTRIgine (LAMICTAL) 150 MG tablet   Oral   Take 150 mg by mouth 2 (two) times daily.         . traZODone (DESYREL) 100 MG tablet   Oral   Take 100 mg by mouth at bedtime.          BP 124/76  Pulse 81  Temp(Src) 97.7 F (36.5 C) (Oral)  Resp 14  SpO2 97% Physical Exam  Nursing note and vitals reviewed. Constitutional: He is oriented to person, place, and time. He appears well-developed and well-nourished.  Pt lying in exam bed in darkened exam room. Appears fatigued.   HENT:  Head: Normocephalic and atraumatic.  Eyes: Conjunctivae and EOM are normal. Pupils are equal, round, and reactive to light. Right eye exhibits no discharge. Left eye exhibits no discharge. No scleral icterus.  Neck: Normal range of motion. Neck supple.  Midline neck tenderness and cervical paraspinal tenderness but no step offs or crepitus.   Cardiovascular: Normal rate, regular rhythm and normal  heart sounds.   Pulmonary/Chest: Effort normal and breath sounds normal. No respiratory distress. He has no wheezes. He has no rales. He exhibits no tenderness.  Abdominal: Soft. Bowel sounds are normal. He exhibits no distension and no mass. There is no tenderness. There is no rebound and no guarding.  Musculoskeletal: Normal range of motion. He exhibits tenderness.  No midline thoracic or lumbar tenderness. Tenderness along lower lumbar musculature.   Neurological: He is alert and oriented to person, place, and time. No cranial nerve deficit. Coordination normal.  Alert and oriented but appears clinically  intoxicated.  Skin: Skin is warm and dry.    ED Course  Procedures (including critical care time) Labs Review Labs Reviewed  CBC - Abnormal; Notable for the following:    Hemoglobin 17.4 (*)    MCHC 36.3 (*)    All other components within normal limits  BASIC METABOLIC PANEL - Abnormal; Notable for the following:    Glucose, Bld 65 (*)    Calcium 8.1 (*)    All other components within normal limits  ETHANOL - Abnormal; Notable for the following:    Alcohol, Ethyl (B) 269 (*)    All other components within normal limits  URINE RAPID DRUG SCREEN (HOSP PERFORMED)  LAMOTRIGINE LEVEL  TRAZODONE (DESYREL), BLOOD   Imaging Review Ct Head Wo Contrast  08/10/2013   CLINICAL DATA:  Unknown injury, seizure  EXAM: CT HEAD WITHOUT CONTRAST  CT CERVICAL SPINE WITHOUT CONTRAST  TECHNIQUE: Multidetector CT imaging of the head and cervical spine was performed following the standard protocol without intravenous contrast. Multiplanar CT image reconstructions of the cervical spine were also generated.  COMPARISON:  CT head dated 05/17/2012  FINDINGS: CT HEAD FINDINGS  No intracranial hemorrhage. No parenchymal contusion. No midline shift or mass effect. Basilar cisterns are patent. No skull base fracture. No fluid in the paranasal sinuses or mastoid air cells.  There is mucosal thickening in the maxillary sinuses. Frontal sinuses are clear.  CT CERVICAL SPINE FINDINGS  No prevertebral soft tissue swelling. Normal alignment of cervical vertebral bodies. No loss of vertebral body height. Normal facet articulation. Normal craniocervical junction.  No evidence epidural or paraspinal hematoma.  IMPRESSION: 1. No intracranial trauma. 2. No cervical spine fracture.   Electronically Signed   By: Genevive Bi M.D.   On: 08/10/2013 23:42   Ct Cervical Spine Wo Contrast  08/10/2013   CLINICAL DATA:  Unknown injury, seizure  EXAM: CT HEAD WITHOUT CONTRAST  CT CERVICAL SPINE WITHOUT CONTRAST  TECHNIQUE:  Multidetector CT imaging of the head and cervical spine was performed following the standard protocol without intravenous contrast. Multiplanar CT image reconstructions of the cervical spine were also generated.  COMPARISON:  CT head dated 05/17/2012  FINDINGS: CT HEAD FINDINGS  No intracranial hemorrhage. No parenchymal contusion. No midline shift or mass effect. Basilar cisterns are patent. No skull base fracture. No fluid in the paranasal sinuses or mastoid air cells.  There is mucosal thickening in the maxillary sinuses. Frontal sinuses are clear.  CT CERVICAL SPINE FINDINGS  No prevertebral soft tissue swelling. Normal alignment of cervical vertebral bodies. No loss of vertebral body height. Normal facet articulation. Normal craniocervical junction.  No evidence epidural or paraspinal hematoma.  IMPRESSION: 1. No intracranial trauma. 2. No cervical spine fracture.   Electronically Signed   By: Genevive Bi M.D.   On: 08/10/2013 23:42    EKG Interpretation   None       MDM   1.  Alcohol intoxication    Pt with hx of seizure and admits to alcohol consumption BIB EMS for possible seizure and/or Etoh.  Pt states his GF was with him with possible seizure occurred but did not come to the ED.  Pt states he does not recall falling or hitting his head but c/o head, neck, and low back pain.  Due to limited hx, will get head and neck CT.  Labs: CBC, BMP, etoh, urine drug, trazodone and lamotrigine levels.  Tx in ED: fluids and thiamine  CT head and neck: unremarkable.  Labs: unremarkable.  1:05 AM Signed out pt to Dr. Patria Mane at shift change.  Plan is to allow pt to sober up in ED then be discharged home to f/u with his neurologist Dr. Sandria Manly.  Junius Finner, PA-C 08/11/13 [phone]

## 2013-08-11 ENCOUNTER — Inpatient Hospital Stay (HOSPITAL_COMMUNITY)
Admission: AD | Admit: 2013-08-11 | Discharge: 2013-08-14 | DRG: 885 | Disposition: A | Discharge status: Home / Self Care | Payer: Medicaid Other | Source: Intra-hospital | Attending: Psychiatry | Admitting: Psychiatry

## 2013-08-11 ENCOUNTER — Encounter (HOSPITAL_COMMUNITY): Payer: Self-pay | Admitting: *Deleted

## 2013-08-11 ENCOUNTER — Emergency Department (HOSPITAL_COMMUNITY)
Admission: EM | Admit: 2013-08-11 | Discharge: 2013-08-11 | Disposition: A | Discharge status: Other Acute Inpatient Hospital | Payer: Medicaid Other | Source: Home / Self Care | Attending: Emergency Medicine | Admitting: Emergency Medicine

## 2013-08-11 DIAGNOSIS — R45851 Suicidal ideations: Secondary | ICD-10-CM

## 2013-08-11 DIAGNOSIS — F1020 Alcohol dependence, uncomplicated: Secondary | ICD-10-CM | POA: Diagnosis present

## 2013-08-11 DIAGNOSIS — F314 Bipolar disorder, current episode depressed, severe, without psychotic features: Principal | ICD-10-CM | POA: Diagnosis present

## 2013-08-11 DIAGNOSIS — F411 Generalized anxiety disorder: Secondary | ICD-10-CM | POA: Diagnosis present

## 2013-08-11 DIAGNOSIS — Z79899 Other long term (current) drug therapy: Secondary | ICD-10-CM

## 2013-08-11 LAB — ETHANOL: Alcohol, Ethyl (B): 269 mg/dL — ABNORMAL HIGH (ref 0–11)

## 2013-08-11 LAB — BASIC METABOLIC PANEL
BUN: 8 mg/dL (ref 6–23)
CO2: 24 mEq/L (ref 19–32)
Calcium: 8.1 mg/dL — ABNORMAL LOW (ref 8.4–10.5)
Chloride: 101 mEq/L (ref 96–112)
Creatinine, Ser: 0.68 mg/dL (ref 0.50–1.35)
GFR calc Af Amer: >90 mL/min (ref >90)
GFR calc non Af Amer: >90 mL/min (ref >90)
Glucose, Bld: 65 mg/dL — ABNORMAL LOW (ref 70–99)
Potassium: 3.6 mEq/L (ref 3.5–5.1)
Sodium: 139 mEq/L (ref 135–145)

## 2013-08-11 LAB — RAPID URINE DRUG SCREEN, HOSP PERFORMED
Amphetamines: NOT DETECTED
Barbiturates: NOT DETECTED
Barbiturates: NOT DETECTED
Benzodiazepines: NOT DETECTED
Benzodiazepines: NOT DETECTED
Cocaine: NOT DETECTED
Cocaine: NOT DETECTED
Opiates: NOT DETECTED
Opiates: NOT DETECTED
Tetrahydrocannabinol: NOT DETECTED
Tetrahydrocannabinol: NOT DETECTED

## 2013-08-11 LAB — LAMOTRIGINE LEVEL: Lamotrigine Lvl: <2 ug/mL — ABNORMAL LOW (ref 3.0–14.0)

## 2013-08-11 MED ORDER — CLONAZEPAM 0.5 MG PO TABS
1.0000 mg | ORAL_TABLET | Freq: Two times a day (BID) | ORAL | Status: DC | PRN
Start: 2013-08-11 — End: 2013-08-11

## 2013-08-11 MED ORDER — ACETAMINOPHEN 325 MG PO TABS: 650.0000 mg | ORAL_TABLET | Freq: Four times a day (QID) | ORAL | Status: DC | PRN

## 2013-08-11 MED ORDER — NICOTINE 21 MG/24HR TD PT24
MEDICATED_PATCH | TRANSDERMAL | Status: AC
  Administered 2013-08-11: 17:00:00
  Filled 2013-08-11: qty 1

## 2013-08-11 MED ORDER — ADULT MULTIVITAMIN W/MINERALS CH
1.0000 | ORAL_TABLET | Freq: Every day | ORAL | Status: DC
  Administered 2013-08-11 – 2013-08-14 (×4): 1 via ORAL
  Filled 2013-08-11 (×7): qty 1

## 2013-08-11 MED ORDER — TRAZODONE HCL 100 MG PO TABS
100.0000 mg | ORAL_TABLET | Freq: Every day | ORAL | Status: DC
  Administered 2013-08-11 – 2013-08-13 (×3): 100 mg via ORAL
  Filled 2013-08-11 (×6): qty 1
  Filled 2013-08-11: qty 3

## 2013-08-11 MED ORDER — LAMOTRIGINE 25 MG PO TABS
50.0000 mg | ORAL_TABLET | Freq: Two times a day (BID) | ORAL | Status: DC
  Administered 2013-08-11: 50 mg via ORAL
  Filled 2013-08-11 (×2): qty 2

## 2013-08-11 MED ORDER — LORAZEPAM 1 MG PO TABS
0.0000 mg | ORAL_TABLET | Freq: Four times a day (QID) | ORAL | Status: DC
  Administered 2013-08-11: 1 mg via ORAL
  Filled 2013-08-11: qty 1

## 2013-08-11 MED ORDER — INFLUENZA VAC SPLIT QUAD 0.5 ML IM SUSP
0.5000 mL | INTRAMUSCULAR | Status: AC
Start: 2013-08-12 — End: 2013-08-12
  Administered 2013-08-12: 0.5 mL via INTRAMUSCULAR
  Filled 2013-08-11: qty 0.5

## 2013-08-11 MED ORDER — LAMOTRIGINE 25 MG PO TABS
50.0000 mg | ORAL_TABLET | Freq: Two times a day (BID) | ORAL | Status: DC
  Administered 2013-08-11 – 2013-08-12 (×2): 50 mg via ORAL
  Filled 2013-08-11 (×5): qty 2

## 2013-08-11 MED ORDER — TRAZODONE HCL 100 MG PO TABS: 100.0000 mg | ORAL_TABLET | Freq: Every day | ORAL | Status: DC

## 2013-08-11 MED ORDER — HYDROXYZINE HCL 25 MG PO TABS
25.0000 mg | ORAL_TABLET | Freq: Four times a day (QID) | ORAL | Status: DC | PRN
  Administered 2013-08-11 – 2013-08-12 (×2): 25 mg via ORAL
  Filled 2013-08-11 (×2): qty 1

## 2013-08-11 MED ORDER — VITAMIN B-1 100 MG PO TABS
100.0000 mg | ORAL_TABLET | Freq: Every day | ORAL | Status: DC
  Administered 2013-08-12 – 2013-08-14 (×3): 100 mg via ORAL
  Filled 2013-08-11 (×5): qty 1

## 2013-08-11 MED ORDER — CHLORDIAZEPOXIDE HCL 25 MG PO CAPS
25.0000 mg | ORAL_CAPSULE | ORAL | Status: AC
  Administered 2013-08-12 – 2013-08-14 (×2): 25 mg via ORAL
  Filled 2013-08-11: qty 1

## 2013-08-11 MED ORDER — CHLORDIAZEPOXIDE HCL 25 MG PO CAPS
25.0000 mg | ORAL_CAPSULE | Freq: Three times a day (TID) | ORAL | Status: AC
  Administered 2013-08-13 (×3): 25 mg via ORAL
  Filled 2013-08-11 (×3): qty 1

## 2013-08-11 MED ORDER — MAGNESIUM HYDROXIDE 400 MG/5ML PO SUSP: 30.0000 mL | Freq: Every day | ORAL | Status: DC | PRN

## 2013-08-11 MED ORDER — THIAMINE HCL 100 MG/ML IJ SOLN
100.0000 mg | Freq: Once | INTRAMUSCULAR | Status: AC
  Administered 2013-08-11: 100 mg via INTRAMUSCULAR
  Filled 2013-08-11: qty 2

## 2013-08-11 MED ORDER — CHLORDIAZEPOXIDE HCL 25 MG PO CAPS
25.0000 mg | ORAL_CAPSULE | Freq: Four times a day (QID) | ORAL | Status: DC | PRN
  Administered 2013-08-12: 25 mg via ORAL
  Filled 2013-08-11: qty 1

## 2013-08-11 MED ORDER — PNEUMOCOCCAL VAC POLYVALENT 25 MCG/0.5ML IJ INJ: 0.5000 mL | INJECTION | INTRAMUSCULAR | Status: DC

## 2013-08-11 MED ORDER — LOPERAMIDE HCL 2 MG PO CAPS
2.0000 mg | ORAL_CAPSULE | ORAL | Status: DC | PRN
  Administered 2013-08-11: 2 mg via ORAL
  Administered 2013-08-11: 4 mg via ORAL
  Filled 2013-08-11: qty 1
  Filled 2013-08-11: qty 2

## 2013-08-11 MED ORDER — LORAZEPAM 1 MG PO TABS: 0.0000 mg | ORAL_TABLET | Freq: Two times a day (BID) | ORAL | Status: DC

## 2013-08-11 MED ORDER — CHLORDIAZEPOXIDE HCL 25 MG PO CAPS: 25.0000 mg | ORAL_CAPSULE | Freq: Every day | ORAL | Status: DC

## 2013-08-11 MED ORDER — ALUM & MAG HYDROXIDE-SIMETH 200-200-20 MG/5ML PO SUSP: 30.0000 mL | ORAL | Status: DC | PRN

## 2013-08-11 MED ORDER — ONDANSETRON 4 MG PO TBDP: 4.0000 mg | ORAL_TABLET | Freq: Four times a day (QID) | ORAL | Status: DC | PRN

## 2013-08-11 MED ORDER — CHLORDIAZEPOXIDE HCL 25 MG PO CAPS
25.0000 mg | ORAL_CAPSULE | Freq: Four times a day (QID) | ORAL | Status: AC
  Administered 2013-08-11 – 2013-08-12 (×5): 25 mg via ORAL
  Filled 2013-08-11 (×6): qty 1

## 2013-08-11 NOTE — Progress Notes (Signed)
37 year old male pt admitted on voluntary basis. Pt reports depression, substance abuse, and suicidal thoughts on admission. Pt reports that he lives with his girlfriend and does go to [facility] in [place] for medication management. Pt does have passive SI on admission but is able to contract for safety on the unit. 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 Pt reports that he has been hospitalized in the past for similar circumstances. Pt was oriented to the unit and safety maintained.

## 2013-08-11 NOTE — ED Provider Notes (Signed)
Medical screening examination/treatment/procedure(s) were conducted as a shared visit with non-physician practitioner(s) and myself.  I personally evaluated the patient during the encounter.  EKG Interpretation   None       Results for orders placed during the hospital encounter of 08/10/13  CBC      Result Value Range   WBC 10.1  4.0 - 10.5 K/uL   RBC 5.29  4.22 - 5.81 MIL/uL   Hemoglobin 17.4 (*) 13.0 - 17.0 g/dL   HCT 04.5  40.9 - 81.1 %   MCV 90.5  78.0 - 100.0 fL   MCH 32.9  26.0 - 34.0 pg   MCHC 36.3 (*) 30.0 - 36.0 g/dL   RDW 91.4  78.2 - 95.6 %   Platelets 193  150 - 400 K/uL  BASIC METABOLIC PANEL      Result Value Range   Sodium 139  135 - 145 mEq/L   Potassium 3.6  3.5 - 5.1 mEq/L   Chloride 101  96 - 112 mEq/L   CO2 24  19 - 32 mEq/L   Glucose, Bld 65 (*) 70 - 99 mg/dL   BUN 8  6 - 23 mg/dL   Creatinine, Ser 2.13  0.50 - 1.35 mg/dL   Calcium 8.1 (*) 8.4 - 10.5 mg/dL   GFR calc non Af Amer >90  >90 mL/min   GFR calc Af Amer >90  >90 mL/min  ETHANOL      Result Value Range   Alcohol, Ethyl (B) 269 (*) 0 - 11 mg/dL  URINE RAPID DRUG SCREEN (HOSP PERFORMED)      Result Value Range   Opiates NONE DETECTED  NONE DETECTED   Cocaine NONE DETECTED  NONE DETECTED   Benzodiazepines NONE DETECTED  NONE DETECTED   Amphetamines NONE DETECTED  NONE DETECTED   Tetrahydrocannabinol NONE DETECTED  NONE DETECTED   Barbiturates NONE DETECTED  NONE DETECTED    5:07 AM Patient feels much better this time.  Alcohol intoxication.  The patient is alert and oriented x3 this time.  Discharge home in good condition.  Outpatient resources for his alcohol abuse.  No indication for inpatient treatment.  It's questionable as to whether or not the patient had a stroke or not.  I requested that he followup with his neurologist and take his medications as prescribed.  Lyanne Co, MD 08/11/13 [phone]

## 2013-08-11 NOTE — Progress Notes (Signed)
Patient came to medication window and requested another dose of imodium for diarrhea. Patient attended group this evening and has been observed up in the dayroom watching tv with minimal interaction with peers. Patient c/o withdrawal symptoms and reports feeling anxious. Writer observed tremors and visteril was given. Support and encouragement offered. Safety maintained with 15 min checks.

## 2013-08-11 NOTE — ED Notes (Signed)
Pt reports feeling like he will have a seizure soon, PA Rob made aware, pt will start on Home Med consist of Lamictal.

## 2013-08-11 NOTE — ED Notes (Addendum)
Pt was d/c this morning at 0600.  States that he "forgot to tell the doctor he is having suicidal thoughts".  Also wants detox.  Plan to cut himself.
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
## 2013-08-11 NOTE — Tx Team (Signed)
Initial Interdisciplinary Treatment Plan  PATIENT STRENGTHS: (choose at least two) Ability for insight Average or above average intelligence Capable of independent living General fund of knowledge  PATIENT STRESSORS: Substance abuse   PROBLEM LIST: Problem List/Patient Goals Date to be addressed Date deferred Reason deferred Estimated date of resolution  ETOH abuse 08/11/13     Suicidal Ideation 08/11/13                                                DISCHARGE CRITERIA:  Ability to meet basic life and health needs Improved stabilization in mood, thinking, and/or behavior Withdrawal symptoms are absent or subacute and managed without 24-hour nursing intervention  PRELIMINARY DISCHARGE PLAN: Attend aftercare/continuing care group Return to previous living arrangement  PATIENT/FAMIILY INVOLVEMENT: This treatment plan has been presented to and reviewed with the patient, [name], and/or family member, .  The patient and family have been given the opportunity to ask questions and make suggestions.  [name], [place] 08/11/2013, 4:30 PM

## 2013-08-11 NOTE — ED Notes (Signed)
Pt transported to [facility] with a transporter

## 2013-08-11 NOTE — ED Notes (Signed)
Call report to Kriss at [facility]

## 2013-08-11 NOTE — Progress Notes (Signed)
[facility] Group Notes:  (Nursing/MHT/Case Management/Adjunct)  Date:  08/11/2013  Time:  8:00 p.m   Type of Therapy:  Psychoeducational Skills  Participation Level:  Minimal  Participation Quality:  Resistant  Affect:  Flat  Cognitive:  Lacking  Insight:  Lacking  Engagement in Group:  Limited  Modes of Intervention:  Education  Summary of Progress/Problems: The patient very little to share with the group except to say that he wishes to feel better.   Hazle Coca S 08/11/2013, 10:51 PM

## 2013-08-11 NOTE — ED Provider Notes (Signed)
CSN: [id number]     Arrival date & time 08/11/13  1324 History   First MD Initiated Contact with Patient 08/11/13 1014     Chief Complaint  Patient presents with  . Medical Clearance   (Consider location/radiation/quality/duration/timing/severity/associated sxs/prior Treatment) HPI Comments: Patient presents to the emergency department with chief complaint of suicidal ideation. He was seen here last night for alcohol detox. He was released this morning, but returns stating that he "forgot to tell the doctor that he was feeling suicidal." I asked the patient why he did not tell the doctor about his suicidal thoughts, and he states that he was "afraid." He states that he has had suicidal thoughts before, and one time attempted to "cut himself real bad."  He states that if he goes home, he would try to do this again. He denies any other complaints at this time. Denies any homicidal thoughts.  The history is provided by the patient. No language interpreter was used.    Past Medical History  Diagnosis Date  . Seizures    No past surgical history on file. No family history on file. History  Substance Use Topics  . Smoking status: Current Every Day Smoker -- 2.00 packs/day for 10 years  . Smokeless tobacco: Not on file  . Alcohol Use: Yes    Review of Systems  All other systems reviewed and are negative.    Allergies  Haloperidol and related  Home Medications   Current Outpatient Rx  Name  Route  Sig  Dispense  Refill  . clonazePAM (KLONOPIN) 1 MG tablet   Oral   Take 1 mg by mouth 2 (two) times daily as needed for anxiety (anxiety).         Marland Kitchen ibuprofen (ADVIL,MOTRIN) 200 MG tablet   Oral   Take 400 mg by mouth every 6 (six) hours as needed.         . lamoTRIgine (LAMICTAL) 150 MG tablet   Oral   Take 50 mg by mouth 2 (two) times daily.          . traZODone (DESYREL) 100 MG tablet   Oral   Take 100 mg by mouth at bedtime.          BP 141/87  Pulse 97   Temp(Src) 97.6 F (36.4 C) (Oral)  SpO2 100% Physical Exam  Nursing note and vitals reviewed. Constitutional: He is oriented to person, place, and time. He appears well-developed and well-nourished.  HENT:  Head: Normocephalic and atraumatic.  Right Ear: External ear normal.  Left Ear: External ear normal.  Nose: Nose normal.  Mouth/Throat: Oropharynx is clear and moist. No oropharyngeal exudate.  Eyes: Conjunctivae and EOM are normal. Pupils are equal, round, and reactive to light. Right eye exhibits no discharge. Left eye exhibits no discharge. No scleral icterus.  Neck: Normal range of motion. Neck supple. No JVD present.  Cardiovascular: Normal rate, regular rhythm, normal heart sounds and intact distal pulses.  Exam reveals no gallop and no friction rub.   No murmur heard. Pulmonary/Chest: Effort normal and breath sounds normal. No respiratory distress. He has no wheezes. He has no rales. He exhibits no tenderness.  Abdominal: Soft. He exhibits no distension and no mass. There is no tenderness. There is no rebound and no guarding.  Musculoskeletal: Normal range of motion. He exhibits no edema and no tenderness.  Neurological: He is alert and oriented to person, place, and time.  Skin: Skin is warm and dry.  Psychiatric: He has  a normal mood and affect. His behavior is normal. Judgment and thought content normal.    ED Course  Procedures (including critical care time) Labs Review Labs Reviewed - No data to display Imaging Review Ct Head Wo Contrast  08/10/2013   CLINICAL DATA:  Unknown injury, seizure  EXAM: CT HEAD WITHOUT CONTRAST  CT CERVICAL SPINE WITHOUT CONTRAST  TECHNIQUE: Multidetector CT imaging of the head and cervical spine was performed following the standard protocol without intravenous contrast. Multiplanar CT image reconstructions of the cervical spine were also generated.  COMPARISON:  CT head dated 05/17/2012  FINDINGS: CT HEAD FINDINGS  No intracranial hemorrhage.  No parenchymal contusion. No midline shift or mass effect. Basilar cisterns are patent. No skull base fracture. No fluid in the paranasal sinuses or mastoid air cells.  There is mucosal thickening in the maxillary sinuses. Frontal sinuses are clear.  CT CERVICAL SPINE FINDINGS  No prevertebral soft tissue swelling. Normal alignment of cervical vertebral bodies. No loss of vertebral body height. Normal facet articulation. Normal craniocervical junction.  No evidence epidural or paraspinal hematoma.  IMPRESSION: 1. No intracranial trauma. 2. No cervical spine fracture.   Electronically Signed   By: Genevive Bi M.D.   On: 08/10/2013 23:42   Ct Cervical Spine Wo Contrast  08/10/2013   CLINICAL DATA:  Unknown injury, seizure  EXAM: CT HEAD WITHOUT CONTRAST  CT CERVICAL SPINE WITHOUT CONTRAST  TECHNIQUE: Multidetector CT imaging of the head and cervical spine was performed following the standard protocol without intravenous contrast. Multiplanar CT image reconstructions of the cervical spine were also generated.  COMPARISON:  CT head dated 05/17/2012  FINDINGS: CT HEAD FINDINGS  No intracranial hemorrhage. No parenchymal contusion. No midline shift or mass effect. Basilar cisterns are patent. No skull base fracture. No fluid in the paranasal sinuses or mastoid air cells.  There is mucosal thickening in the maxillary sinuses. Frontal sinuses are clear.  CT CERVICAL SPINE FINDINGS  No prevertebral soft tissue swelling. Normal alignment of cervical vertebral bodies. No loss of vertebral body height. Normal facet articulation. Normal craniocervical junction.  No evidence epidural or paraspinal hematoma.  IMPRESSION: 1. No intracranial trauma. 2. No cervical spine fracture.   Electronically Signed   By: Genevive Bi M.D.   On: 08/10/2013 23:42    EKG Interpretation   None       MDM   1. Suicidal ideation     Patient was recently seen for alcohol detox. Now complaining of suicidal ideation. States that  he wants to harm himself with a knife. Will have TTS consult. Labs are reviewed from last night, and are unremarkable. Will not repeat labs this morning.   Patient accepted at [facility].  Move to psych ED.   Roxy Horseman, PA-C 08/11/13 [phone]

## 2013-08-11 NOTE — Consult Note (Signed)
Pt accepted to [facility] by Josephine to 504-[facility] to the services of Dr. Elsie Saas  Pt will go by Juel Burrow [phone]  Nurse to call report [phone]  Nurse to send original paper work given to her by TTS with pt to [facility] NP will put pt up for discharge / Transfer

## 2013-08-11 NOTE — ED Notes (Signed)
Pt now in TCU RM 27. Cooperative, calm, drinks offered, continue to report SI thought with plan of cutting his arteries. Flat affect, avoiding eye contact.

## 2013-08-11 NOTE — ED Notes (Signed)
Pt transported to [facility]  

## 2013-08-12 DIAGNOSIS — F411 Generalized anxiety disorder: Secondary | ICD-10-CM

## 2013-08-12 DIAGNOSIS — F1020 Alcohol dependence, uncomplicated: Secondary | ICD-10-CM | POA: Diagnosis present

## 2013-08-12 DIAGNOSIS — F4310 Post-traumatic stress disorder, unspecified: Secondary | ICD-10-CM

## 2013-08-12 DIAGNOSIS — F314 Bipolar disorder, current episode depressed, severe, without psychotic features: Principal | ICD-10-CM | POA: Diagnosis present

## 2013-08-12 DIAGNOSIS — F3130 Bipolar disorder, current episode depressed, mild or moderate severity, unspecified: Secondary | ICD-10-CM

## 2013-08-12 DIAGNOSIS — F1010 Alcohol abuse, uncomplicated: Secondary | ICD-10-CM

## 2013-08-12 MED ORDER — NICOTINE 21 MG/24HR TD PT24
21.0000 mg | MEDICATED_PATCH | Freq: Every day | TRANSDERMAL | Status: DC
  Administered 2013-08-12 – 2013-08-14 (×3): 21 mg via TRANSDERMAL
  Filled 2013-08-12 (×4): qty 1

## 2013-08-12 MED ORDER — LAMOTRIGINE 100 MG PO TABS
100.0000 mg | ORAL_TABLET | Freq: Two times a day (BID) | ORAL | Status: DC
  Administered 2013-08-12 – 2013-08-14 (×4): 100 mg via ORAL
  Filled 2013-08-12 (×5): qty 1
  Filled 2013-08-12: qty 6
  Filled 2013-08-12: qty 1
  Filled 2013-08-12: qty 6

## 2013-08-12 MED ORDER — CLONAZEPAM 0.5 MG PO TABS
0.5000 mg | ORAL_TABLET | Freq: Two times a day (BID) | ORAL | Status: DC
  Administered 2013-08-12 – 2013-08-14 (×5): 0.5 mg via ORAL
  Filled 2013-08-12 (×6): qty 1

## 2013-08-12 NOTE — Progress Notes (Addendum)
[name] remains anxious, pacing in the halls, focused on his somatic complaints . He completed his AM self inventory and on it he wrote he denied no SI within the past 24 hrs, he rated his depression and hopelessness " n/a  / n/a  "  And he's not sure about his DC plans. Per his request , he is restarted on klonopin o.5 mg po bid, for c/o seizures, per his request. Additionally, his lamictal is to 100 mg po bid.   A

## 2013-08-12 NOTE — [facility] Suicide Risk Assessment (Signed)
Suicide Risk Assessment  Admission Assessment     Nursing information obtained from:    Demographic factors:    Current Mental Status:    Loss Factors:    Historical Factors:    Risk Reduction Factors:     CLINICAL FACTORS:   Bipolar Disorder:   Depressive phase Depression:   Anhedonia Comorbid alcohol abuse/dependence Hopelessness Impulsivity Insomnia Recent sense of peace/wellbeing Severe Alcohol/Substance Abuse/Dependencies Epilepsy Previous Psychiatric Diagnoses and Treatments Medical Diagnoses and Treatments/Surgeries  COGNITIVE FEATURES THAT CONTRIBUTE TO RISK:  Closed-mindedness Loss of executive function Polarized thinking Thought constriction (tunnel vision)    SUICIDE RISK:   Moderate:  Frequent suicidal ideation with limited intensity, and duration, some specificity in terms of plans, no associated intent, good self-control, limited dysphoria/symptomatology, some risk factors present, and identifiable protective factors, including available and accessible social support.  PLAN OF CARE: Admitted for alcohol detox treatment and medication management of bipolar disorder. Patient is is crisis management, safety monitoring and medication management.  I certify that inpatient services furnished can reasonably be expected to improve the patient's condition.  Kateri Balch,JANARDHAHA R. 08/12/2013, 4:08 PM
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
## 2013-08-12 NOTE — Progress Notes (Signed)
Writer spoke with patient 1:1 and he reports feeling better since his klonopin was started. He was concerned because his dosage was not the same as what he takes at home. Writer encouraged patient to speak with his doctor concerning this. Patient reports no withdrawal symptoms. Patient denies si/hi/a/v hallucinations. Safety maintained with 15 min checks. Patient compliant with POC.

## 2013-08-12 NOTE — Progress Notes (Signed)
Psychoeducational Group Note  Date: 08/12/2013 Time:  1015  Group Topic/Focus:  Identifying Needs:   The focus of this group is to help patients identify their personal needs that have been historically problematic and identify healthy behaviors to address their needs.  Participation Level:  Active  Participation Quality:  Appropriate  Affect:  Flat  Cognitive:  Oriented  Insight:  Improving  Engagement in Group:  Engaged  Additional Comments:    Gerturde Kuba A 

## 2013-08-12 NOTE — [facility] Group Notes (Signed)
[facility] Group Notes:  (Clinical Social Work)  08/12/2013   3-4PM  Summary of Progress/Problems:   The main focus of today's process group was to identify the patient's current support system and decide on other supports that can be put in place.  The picture on workbook was used to discuss why additional supports are needed, and there was an interactive discussion about unhealthy supports and healthy supports.  The patient expressed full comprehension of the concepts presented, and agreed that there is a need to add more supports.  The patient stated the current supports in place are his fiancee.  He wants to start going to AA/NA meetings when he is discharged, as well as find a counselor to talk to.  Type of Therapy:  Process Group  Participation Level:  Active  Participation Quality:  Attentive  Affect:  Blunted  Cognitive:  Oriented  Insight:  Developing/Improving  Engagement in Therapy:  Engaged  Modes of Intervention:  Education,  Support and [employer], LCSW 08/12/2013, 4:00pm

## 2013-08-12 NOTE — ED Provider Notes (Signed)
Medical screening examination/treatment/procedure(s) were performed by non-physician practitioner and as supervising physician I was immediately available for consultation/collaboration.  EKG Interpretation   None         Richardean Canal, MD 08/12/13 [phone]

## 2013-08-12 NOTE — Progress Notes (Signed)
Psychoeducational Group Note  Date:  08/12/2013 Time:  1015  Group Topic/Focus:  Making Healthy Choices:   The focus of this group is to help patients identify negative/unhealthy choices they were using prior to admission and identify positive/healthier coping strategies to replace them upon discharge.  Participation Level:  Active  Participation Quality:  Appropriate  Affect:  Appropriate  Cognitive:  Oriented  Insight:  Partisipating  Engagement in Group:  Engaged  Additional Comments:  Dione Housekeeper 08/12/2013
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
## 2013-08-13 DIAGNOSIS — F10229 Alcohol dependence with intoxication, unspecified: Secondary | ICD-10-CM

## 2013-08-13 DIAGNOSIS — F1994 Other psychoactive substance use, unspecified with psychoactive substance-induced mood disorder: Secondary | ICD-10-CM

## 2013-08-13 DIAGNOSIS — F1020 Alcohol dependence, uncomplicated: Secondary | ICD-10-CM

## 2013-08-13 NOTE — [facility] Suicide Risk Assessment (Signed)
[facility] INPATIENT:  Family/Significant Other Suicide Prevention Education  Suicide Prevention Education:  Patient Refusal for Family/Significant Other Suicide Prevention Education: The patient [name] has refused to provide written consent for family/significant other to be provided Family/Significant Other Suicide Prevention Education during admission and/or prior to discharge.  Physician notified.  Wynn Banker 08/13/2013, 3:21 PM

## 2013-08-13 NOTE — [facility] Group Notes (Signed)
[facility] LCSW Aftercare Discharge Planning Group Note   08/13/2013 12:36 PM    Participation Quality:  Appropraite  Mood/Affect:  Appropriate  Depression Rating:  1  Anxiety Rating:  8  Thoughts of Suicide:  No  Will you contract for safety?   NA  Current AVH:  No  Plan for Discharge/Comments:  Patient attended discharge planning group and actively participated in group. He advised of being followed by [facility] and not being interested in residential treatment.   CSW provided all participants with daily workbook.   Transportation Means: Patient has transportation.   Supports:  Patient has a support system.   [name], [name] July

## 2013-08-13 NOTE — [facility] Group Notes (Addendum)
[facility] LCSW Group Therapy          Overcoming Obstacles       1:15 -2:30        08/13/2013   3:24 PM    Type of Therapy:  Group Therapy  Participation Level:  Appropriate  Participation Quality:  Appropriate  Affect:  Appropriate, Alert  Cognitive:  Attentive Appropriate  Insight: Developing/Improving Engaged  Engagement in Therapy: Developing/Imprvoing Engaged  Modes of Intervention:  Discussion Exploration  Education Rapport BuildingProblem-Solving Support  Summary of Progress/Problems:  The main focus of today's group was overcoming obstacles.  He stated he has to stay away from negative people and friends who use drugs.   Patient able to identify appropriate coping skills.   [name] 08/13/2013   3:24 PM

## 2013-08-13 NOTE — Progress Notes (Signed)
[facility] Group Notes:  (Nursing/MHT/Case Management/Adjunct)  Date:  08/12/2013  Time:  8:00 p.m.   Type of Therapy:  Psychoeducational Skills  Participation Level:  Active  Participation Quality:  Attentive  Affect:  Appropriate  Cognitive:  Appropriate  Insight:  Improving  Engagement in Group:  Improving  Modes of Intervention:  Education  Summary of Progress/Problems: The patient shared in group this evening that he felt a lot better today. IN addition, he indicated that having a visit with his fiance lift his spirits. As for the theme of the day, his support system consists of the following: his fiance, family, friends , and church.   Hazle Coca S 08/13/2013, 12:51 AM

## 2013-08-13 NOTE — Progress Notes (Signed)
[facility] MD Progress Note  08/13/2013 2:26 PM [name]  MRN:  [id number] Subjective:  Patient has been compliant with his medication without adverse effects. Patient has been participating in unit activities including groups and learning coping skills. Patient has been in this segment with his [facility] at [facility] and want to switch to a different provider. Patient was known for the polysubstance abuse and alcohol dependence and bipolar disorder. Patient has requested high-end dose of Klonopin for seizure activity which he does not have at this time. Patient also be seen in the Librium protocol for alcohol retired symptoms. Patient has denied any suicidal ideation intentions or plans at this time.  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 rate 22, height 6\' 2"  (1.88 m), weight 75.751 kg (167 lb).Body mass index is 21.43 kg/(m^2).  General Appearance: Disheveled and Guarded  Patent attorney::  Fair  Speech:  Normal Rate  Volume:  Decreased  Mood:  Anxious and Depressed  Affect:  Constricted and Depressed  Thought Process:  Goal Directed and Intact  Orientation:  Full (Time, Place, and Person)  Thought Content:  WDL  Suicidal Thoughts:  No  Homicidal Thoughts:  No  Memory:  Recent;   Fair  Judgement:  Impaired  Insight:  Lacking  Psychomotor Activity:  Psychomotor Retardation  Concentration:  Fair   Recall:  Fair  Akathisia:  NA  Handed:  Right  AIMS (if indicated):     Assets:  Communication Skills Desire for Improvement Physical Health Resilience  Sleep:  Number of Hours: 6.25   Current Medications: Current Facility-Administered Medications  Medication Dose Route Frequency Provider Last Rate Last Dose  . acetaminophen (TYLENOL) tablet 650 mg  650 mg Oral Q6H PRN Nanine Means, NP      . alum & mag hydroxide-simeth (MAALOX/MYLANTA) 200-200-20 MG/5ML suspension 30 mL  30 mL Oral Q4H PRN Nanine Means, NP      . chlordiazePOXIDE (LIBRIUM) capsule 25 mg  25 mg Oral Q6H PRN Nanine Means, NP   25 mg at 08/12/13 0702  . chlordiazePOXIDE (LIBRIUM) capsule 25 mg  25 mg Oral TID Nanine Means, NP   25 mg at 08/13/13 1154   Followed by  . [START ON 08/14/2013] chlordiazePOXIDE (LIBRIUM) capsule 25 mg  25 mg Oral BH-qamhs Nanine Means, NP   25 mg at 08/12/13 4098   Followed by  . [START ON 08/15/2013] chlordiazePOXIDE (LIBRIUM) capsule 25 mg  25 mg Oral Daily Nanine Means, NP      . clonazePAM Scarlette [place]) tablet 0.5 mg  0.5 mg Oral BID Nanine Means, NP   0.5 mg at 08/13/13 0826  . hydrOXYzine (ATARAX/VISTARIL) tablet 25 mg  25 mg Oral Q6H PRN Nanine Means, NP   25 mg at 08/12/13 0702  . lamoTRIgine (LAMICTAL) tablet 100 mg  100 mg Oral BID Nanine Means, NP   100 mg at 08/13/13 0827  . loperamide (IMODIUM) capsule 2-4 mg  2-4 mg Oral PRN Nanine Means, NP   2 mg at  08/11/13 2109  . magnesium hydroxide (MILK OF MAGNESIA) suspension 30 mL  30 mL Oral Daily PRN Nanine Means, NP      . multivitamin with minerals tablet 1 tablet  1 tablet Oral Daily Nanine Means, NP   1 tablet at 08/13/13 0826  . nicotine (NICODERM CQ - dosed in mg/24 hours) patch 21 mg  21 mg Transdermal Daily Nehemiah Settle, MD   21 mg at 08/13/13 0824  . ondansetron (ZOFRAN-ODT) disintegrating tablet 4 mg  4 mg Oral Q6H PRN Nanine Means, NP      . pneumococcal 23 valent vaccine (PNU-IMMUNE) injection 0.5 mL  0.5 mL  Intramuscular Tomorrow-1000 Nehemiah Settle, MD      . thiamine (VITAMIN B-1) tablet 100 mg  100 mg Oral Daily Nanine Means, NP   100 mg at 08/13/13 0826  . traZODone (DESYREL) tablet 100 mg  100 mg Oral QHS Nanine Means, NP   100 mg at 08/12/13 2122    Lab Results:  Results for orders placed during the hospital encounter of 08/11/13 (from the past 48 hour(s))  URINE RAPID DRUG SCREEN (HOSP PERFORMED)     Status: None   Collection Time    08/11/13  2:38 PM      Result Value Range   Opiates NONE DETECTED  NONE DETECTED   Cocaine NONE DETECTED  NONE DETECTED   Benzodiazepines NONE DETECTED  NONE DETECTED   Amphetamines NONE DETECTED  NONE DETECTED   Tetrahydrocannabinol NONE DETECTED  NONE DETECTED   Barbiturates NONE DETECTED  NONE DETECTED   Comment:            DRUG SCREEN FOR MEDICAL PURPOSES     ONLY.  IF CONFIRMATION IS NEEDED     FOR ANY PURPOSE, NOTIFY LAB     WITHIN 5 DAYS.                LOWEST DETECTABLE LIMITS     FOR URINE DRUG SCREEN     Drug Class       Cutoff (ng/mL)     Amphetamine      1000     Barbiturate      200     Benzodiazepine   200     Tricyclics       300     Opiates          300     Cocaine          300     THC              50    Physical Findings: AIMS: Facial and Oral Movements Muscles of Facial Expression: None, normal Lips and Perioral Area: None, normal Jaw: None, normal Tongue: None, normal,Extremity Movements Upper (arms, wrists, hands, fingers): None, normal Lower (legs, knees, ankles, toes): None, normal, Trunk Movements Neck, shoulders, hips: None, normal, Overall Severity Severity of abnormal movements (highest score from questions above): None, normal Incapacitation due to abnormal movements: None, normal Patient's awareness of abnormal movements (rate only patient's report): No Awareness, Dental Status Current problems with teeth and/or dentures?: No Does patient usually wear dentures?: No  CIWA:  CIWA-Ar Total: 2 COWS:      Treatment Plan Summary: Daily contact with patient to assess and evaluate symptoms and progress in treatment Medication management  Plan: Treatment Plan/Recommendations:   1. Admit for crisis management and stabilization. 2. Medication management to reduce current symptoms to base line and improve the patient's overall level of  functioning. Continue alcohol detox treatment program as per protocol. 3. Treat health problems as indicated. 4. Develop treatment plan to decrease risk of relapse upon discharge and to reduce the need for readmission. 5. Psycho-social education regarding relapse prevention and self care. 6. Health care follow up as needed for medical problems. 7. Restart home medications where appropriate. 8. Disposition plans are in progress  Medical Decision Making Problem Points:  Established problem, worsening (2), Review of last therapy session (1) and Review of psycho-social stressors (1) Data Points:  Review or order clinical lab tests (1) Review or order medicine tests (1) Review of medication regiment & side effects (2) Review of new medications or change in dosage (2)  I certify that inpatient services furnished can reasonably be expected to improve the patient's condition.   Nehemiah Settle., M.D. 08/13/2013, 2:26 PM

## 2013-08-13 NOTE — Progress Notes (Signed)
D: pt. Stated that his day so far has been pretty good, way better than yesterday. Pt stated that he may be d/c tomorrow and that made him happy to hear. Pt. Stated that he still wants his klonopin to be increased, but isn't going to worry too much about it if he is leaving tomorrow. Denies SI/HI/AVH. Pt. States he has some pain in his lower back, but that is normal for him and he does not want anything for it. A: q 15 min safety. Support and encouragement offered. Medication given R: pt. Remains safe on the unit. Interacting appropriately with staff and others

## 2013-08-13 NOTE — Tx Team (Signed)
Interdisciplinary Treatment Plan Update   Date Reviewed:  08/13/2013  Time Reviewed:  9:44 AM  Progress in Treatment:   Attending groups: Yes Participating in groups: Yes Taking medication as prescribed: Yes  Tolerating medication: Yes Family/Significant other contact made: No, but will ask patient for consent for collateral contact Patient understands diagnosis: Yes  Discussing patient identified problems/goals with staff: Yes Medical problems stabilized or resolved: Yes Denies suicidal/homicidal ideation: Yes Patient has not harmed self or others: Yes  For review of initial/current patient goals, please see plan of care.  Estimated Length of Stay:  1 day  Reasons for Continued Hospitalization:  Anxiety  Medication stabilization  New Problems/Goals identified:    Discharge Plan or Barriers:   Home with outpatient follow up at [facility]  Additional Comments:   N/A Attendees:  Patient:  08/13/2013 9:44 AM   Signature: Mervyn Gay, MD 08/13/2013 9:44 AM  Signature:  Verne Spurr, PA 08/13/2013 9:44 AM  Signature: Marzetta Board, RN 08/13/2013 9:44 AM  Signature:Beverly Terrilee Croak, RN 08/13/2013 9:44 AM  Signature:  Neill Loft RN 08/13/2013 9:44 AM  Signature:  Juline Patch, LCSW 08/13/2013 9:44 AM  Signature:  Reyes Ivan, LCSW 08/13/2013 9:44 AM  Signature:  Sharin Grave Coordinator 08/13/2013 9:44 AM  Signature:  Onnie Boer, RN, UR CM 08/13/2013 9:44 AM  Signature:  08/13/2013  9:44 AM  Signature:    08/13/2013  9:44 AM  Signature:  08/13/2013  9:44 AM    Scribe for Treatment Team:   Juline Patch,  08/13/2013 9:44 AM

## 2013-08-13 NOTE — Progress Notes (Signed)
Patient ID: [name], male   DOB: Nov 01, 1975, 37 y.o.   MRN: [id number] D- Patient reports he slept well and his appetite is improving.  His energy level is low and his ability to pay attention is improving.  He is denying feeling depressed but is feeling anxious.  He is concerned that he is not "on my regular dose of klonopin"  A- Talked with patient about his detox and encouraged him to attend all groups.  R- Patient is attending groups and he is interacting with peers and staff.  His vital signs are in normal range.

## 2013-08-13 NOTE — Progress Notes (Signed)
Adult Psychoeducational Group Note  Date:  08/13/2013 Time:  10:12 PM  Group Topic/Focus:  Wrap-Up Group:   The focus of this group is to help patients review their daily goal of treatment and discuss progress on daily workbooks.  Participation Level:  Active  Participation Quality:  Attentive  Affect:  Appropriate  Cognitive:  Alert and Appropriate  Insight: Appropriate and Good  Engagement in Group:  Engaged  Modes of Intervention:  Discussion  Additional Comments:  Pt attended group and explained that he had a better day because his girlfriend came to visit. He was appropriate and engaged during group.  [place] Shi K 08/13/2013, 10:12 PM

## 2013-08-13 NOTE — Progress Notes (Signed)
Recreation Therapy Notes  Date: 11.17.2014 Time: 3:00pm Location: [address] Dayroom  Group Topic: Gratitude  Goal Area(s) Addresses:  Patient will be able to identify things they are grateful for. Patient will be able to identify benefit of recognizing things they are grateful for.   Behavioral Response: Engaged, Appropriate  Intervention: [place]ala   Activity: Patients were provided worksheet with "I am Grateful For" surrounded by categories such as - Happiness, Laughter; Work, Play, Rest; Knowledge, Education. Using these categories patients were asked identify 2-3 things they are grateful that fit into each category.   Education: Runner, broadcasting/film/video, Pharmacologist, Self-expression  Education Outcome: Acknowledges understanding.   Clinical Observations/Feedback: Patient actively engaged in activity, identifying things he is grateful for to correspond with each category. Patient shared he is grateful for his job, as he does something he enjoys. Patient additionally shared that he is grateful because he is in a safe environment right now. Patient additionally nodded in agreement as peers identified positive benefit of being grateful and recognizing things he is grateful for. By show of hands patient indicated he is more aware of positives in his life after doing this activity than prior to starting.   Marykay Lex Milee Qualls, LRT/CTRS  Jearl Klinefelter 08/13/2013 3:58 PM

## 2013-08-13 NOTE — Progress Notes (Signed)
Adult Psychoeducational Group Note  Date:  08/13/2013 Time:  11:00am Group Topic/Focus:  Wellness Toolbox:   The focus of this group is to discuss various aspects of wellness, balancing those aspects and exploring ways to increase the ability to experience wellness.  Patients will create a wellness toolbox for use upon discharge.  Participation Level:  Active  Participation Quality:  Appropriate and Attentive  Affect:  Appropriate  Cognitive:  Alert and Appropriate  Insight: Appropriate  Engagement in Group:  Engaged  Modes of Intervention:  Discussion and Education  Additional Comments:   Pt attended and participated in group. Discussion was on wellness. The question was asked; What does wellness mean to you? Pt stated wellness means taking care of yourself and being stable.  Shelly Bombard D 08/13/2013, 1:30 PM

## 2013-08-14 LAB — TRAZODONE (DESYREL), BLOOD: Trazodone Lvl: <50 ng/mL (ref 800–1600)

## 2013-08-14 MED ORDER — LAMOTRIGINE 100 MG PO TABS: 100.0000 mg | ORAL_TABLET | Freq: Two times a day (BID) | ORAL | Status: DC

## 2013-08-14 MED ORDER — CLONAZEPAM 0.5 MG PO TABS: 0.5000 mg | ORAL_TABLET | Freq: Two times a day (BID) | ORAL | Status: DC

## 2013-08-14 MED ORDER — TRAZODONE HCL 100 MG PO TABS: 100.0000 mg | ORAL_TABLET | Freq: Every day | ORAL | Status: DC

## 2013-08-14 NOTE — Progress Notes (Signed)
Recreation Therapy Notes  Date: 11.18.2014 Time: 2:45pm Location: [address] Dayroom   Group Topic: Animal Assisted Activities (AAA)  Behavioral Response: Did not attend  Alonte Wulff L Nayda Riesen, LRT/CTRS  Hughes Wyndham L 08/14/2013 5:14 PM 

## 2013-08-14 NOTE — Progress Notes (Signed)
Patient in day room interacting with peers at the beginning of the shift. He attended group and seemed to be doing well. His mood and affect appropriate. He denied SI/HI and denied Hallucinations. Patient reported that eh would be going home to morrow. He seemed excited about that and stated his medications working and he feels ready to go. Writer encouraged and supported patient. Patient receptive to encouragement and support. Q 15 minute check continues as ordered to maintain safety.

## 2013-08-14 NOTE — [facility] Suicide Risk Assessment (Signed)
Suicide Risk Assessment  Discharge Assessment     Demographic Factors:  Male, Adolescent or young adult, Caucasian, Low socioeconomic status and Unemployed  Mental Status Per Nursing Assessment::   On Admission:     Current Mental Status by Physician: Mental Status Examination: Patient appeared as per his stated age, casually dressed, and fairly groomed, and maintaining good eye contact. Patient has good mood and his affect was constricted. He has normal rate, rhythm, and volume of speech. His thought process is linear and goal directed. Patient has denied suicidal, homicidal ideations, intentions or plans. Patient has no evidence of auditory or visual hallucinations, delusions, and paranoia. Patient has fair insight judgment and impulse control.  Loss Factors: Financial problems/change in socioeconomic status  Historical Factors: Impulsivity  Risk Reduction Factors:   Sense of responsibility to family, Religious beliefs about death, Living with another person, especially a relative, Positive social support, Positive therapeutic relationship and Positive coping skills or problem solving skills  Continued Clinical Symptoms:  Bipolar Disorder:   Bipolar II Depressive phase Alcohol/Substance Abuse/Dependencies Previous Psychiatric Diagnoses and Treatments  Cognitive Features That Contribute To Risk:  Polarized thinking    Suicide Risk:  Minimal: No identifiable suicidal ideation.  Patients presenting with no risk factors but with morbid ruminations; may be classified as minimal risk based on the severity of the depressive symptoms  Discharge Diagnoses:   AXIS I:  Bipolar, Depressed, Substance Induced Mood Disorder and Alcohol dependence AXIS II:  Deferred AXIS III:   Past Medical History  Diagnosis Date  . Seizures    AXIS IV:  other psychosocial or environmental problems, problems related to social environment and problems with primary support group AXIS V:  61-70 mild  symptoms  Plan Of Care/Follow-up recommendations:  Activity:  As tolerated Diet:  Regular  Is patient on multiple antipsychotic therapies at discharge:  No   Has Patient had three or more failed trials of antipsychotic monotherapy by history:  No  Recommended Plan for Multiple Antipsychotic Therapies: NA  Nehemiah Settle., M.D. 08/14/2013, 12:23 PM

## 2013-08-14 NOTE — Progress Notes (Signed)
Patient ID: [name], male   DOB: Jun 24, 1976, 37 y.o.   MRN: [id number] D_ Patient is discharged ambulatory to ride home with his fiance.  He denies SI/HI.  He verbalizes understanding of his discharge meds and followup.  He was given scripts and a supply of meds.  He is hopeful that he will be able to maintain his sobriety.

## 2013-08-14 NOTE — Progress Notes (Signed)
[facility] Adult Case Management Discharge Plan :  Will you be returning to the same living situation after discharge: Yes,  Patient is returning to his home. At discharge, do you have transportation home?:Yes,  Patient will arrange transporation home. Do you have the ability to pay for your medications:No.  Patient will be assisted with indigent medications.   Release of information consent forms completed and in the chart;  Patient's signature needed at discharge.  Patient to Follow up at: Follow-up Information   Follow up with [facility] On 08/15/2013. (You are scheduled with [facility] on Wednesday, Novmeber 19, 2014 at [facility])    Contact information:   [address] [place], [place]   [address]  [phone]      Patient denies SI/HI:  Patient no longer endorsing SI/HI or other thoughts of self harm.  Safety Planning and Suicide Prevention discussed: .Reviewed with all patients during discharge planning group  Alee Katen, [name] July 08/14/2013, 11:27 AM

## 2013-08-14 NOTE — Progress Notes (Signed)
The focus of this group is to educate the patient on the purpose and policies of crisis stabilization and provide a format to answer questions about their admission.  The group details unit policies and expectations of patients while admitted. Patient attended group.  He was tired and did not actively participate.
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
## 2013-08-17 NOTE — Progress Notes (Signed)
Patient Discharge Instructions:  After Visit Summary (AVS):   Faxed to:  08/17/13 Psychiatric Admission Assessment Note:   Faxed to:  08/17/13 Suicide Risk Assessment - Discharge Assessment:   Faxed to:  08/17/13 Faxed/Sent to the Next Level Care provider:  08/17/13 Faxed to [facility] @ [phone]  Jerelene Redden, 08/17/2013, 1:36 PM

## 2013-08-27 ENCOUNTER — Encounter (HOSPITAL_COMMUNITY): Payer: Self-pay | Admitting: Emergency Medicine

## 2013-08-27 ENCOUNTER — Emergency Department (HOSPITAL_COMMUNITY)
Admission: EM | Admit: 2013-08-27 | Discharge: 2013-08-28 | Disposition: A | Discharge status: Other Acute Inpatient Hospital | Payer: Medicaid Other | Attending: Emergency Medicine | Admitting: Emergency Medicine

## 2013-08-27 DIAGNOSIS — R45851 Suicidal ideations: Secondary | ICD-10-CM | POA: Insufficient documentation

## 2013-08-27 DIAGNOSIS — F1020 Alcohol dependence, uncomplicated: Secondary | ICD-10-CM

## 2013-08-27 DIAGNOSIS — F314 Bipolar disorder, current episode depressed, severe, without psychotic features: Secondary | ICD-10-CM

## 2013-08-27 DIAGNOSIS — F329 Major depressive disorder, single episode, unspecified: Secondary | ICD-10-CM | POA: Insufficient documentation

## 2013-08-27 DIAGNOSIS — G40909 Epilepsy, unspecified, not intractable, without status epilepticus: Secondary | ICD-10-CM | POA: Insufficient documentation

## 2013-08-27 DIAGNOSIS — Z0289 Encounter for other administrative examinations: Secondary | ICD-10-CM | POA: Insufficient documentation

## 2013-08-27 DIAGNOSIS — F319 Bipolar disorder, unspecified: Secondary | ICD-10-CM | POA: Insufficient documentation

## 2013-08-27 DIAGNOSIS — Z79899 Other long term (current) drug therapy: Secondary | ICD-10-CM | POA: Insufficient documentation

## 2013-08-27 DIAGNOSIS — F3289 Other specified depressive episodes: Secondary | ICD-10-CM | POA: Insufficient documentation

## 2013-08-27 DIAGNOSIS — F1010 Alcohol abuse, uncomplicated: Secondary | ICD-10-CM | POA: Insufficient documentation

## 2013-08-27 DIAGNOSIS — F17200 Nicotine dependence, unspecified, uncomplicated: Secondary | ICD-10-CM | POA: Insufficient documentation

## 2013-08-27 HISTORY — DX: Bipolar disorder, unspecified: F31.9

## 2013-08-27 LAB — COMPREHENSIVE METABOLIC PANEL
Albumin: 3.7 g/dL (ref 3.5–5.2)
Alkaline Phosphatase: 101 U/L (ref 39–117)
BUN: 4 mg/dL — ABNORMAL LOW (ref 6–23)
Calcium: 8.4 mg/dL (ref 8.4–10.5)
Chloride: 104 mEq/L (ref 96–112)
Creatinine, Ser: 0.8 mg/dL (ref 0.50–1.35)
GFR calc Af Amer: >90 mL/min (ref >90)
Glucose, Bld: 104 mg/dL — ABNORMAL HIGH (ref 70–99)
Total Bilirubin: 1.1 mg/dL (ref 0.3–1.2)
Total Protein: 7.1 g/dL (ref 6.0–8.3)

## 2013-08-27 LAB — CBC
HCT: 46.8 % (ref 39.0–52.0)
Hemoglobin: 16.2 g/dL (ref 13.0–17.0)
MCH: 31.6 pg (ref 26.0–34.0)
MCHC: 34.6 g/dL (ref 30.0–36.0)
MCV: 91.4 fL (ref 78.0–100.0)
Platelets: 228 10*3/uL (ref 150–400)
RDW: 13.3 % (ref 11.5–15.5)

## 2013-08-27 LAB — RAPID URINE DRUG SCREEN, HOSP PERFORMED
Amphetamines: NOT DETECTED
Cocaine: NOT DETECTED
Opiates: NOT DETECTED

## 2013-08-27 LAB — SALICYLATE LEVEL: Salicylate Lvl: <2 mg/dL — ABNORMAL LOW (ref 2.8–20.0)

## 2013-08-27 MED ORDER — LORAZEPAM 1 MG PO TABS
1.0000 mg | ORAL_TABLET | Freq: Three times a day (TID) | ORAL | Status: DC | PRN
  Administered 2013-08-28: 1 mg via ORAL
  Filled 2013-08-27: qty 1

## 2013-08-27 MED ORDER — ONDANSETRON HCL 4 MG PO TABS: 4.0000 mg | ORAL_TABLET | Freq: Three times a day (TID) | ORAL | Status: DC | PRN

## 2013-08-27 MED ORDER — ALUM & MAG HYDROXIDE-SIMETH 200-200-20 MG/5ML PO SUSP: 30.0000 mL | ORAL | Status: DC | PRN

## 2013-08-27 MED ORDER — ZOLPIDEM TARTRATE 5 MG PO TABS: 5.0000 mg | ORAL_TABLET | Freq: Every evening | ORAL | Status: DC | PRN

## 2013-08-27 MED ORDER — ACETAMINOPHEN 325 MG PO TABS: 650.0000 mg | ORAL_TABLET | ORAL | Status: DC | PRN

## 2013-08-27 MED ORDER — IBUPROFEN 200 MG PO TABS: 600.0000 mg | ORAL_TABLET | Freq: Three times a day (TID) | ORAL | Status: DC | PRN

## 2013-08-27 MED ORDER — NICOTINE 21 MG/24HR TD PT24
21.0000 mg | MEDICATED_PATCH | Freq: Every day | TRANSDERMAL | Status: DC
  Administered 2013-08-28: 21 mg via TRANSDERMAL
  Filled 2013-08-27: qty 1

## 2013-08-27 NOTE — ED Notes (Signed)
Pt reports last drink was tonight, at least a 12 pk of beer in the past 24 hours.

## 2013-08-27 NOTE — Progress Notes (Signed)
Patient confirms his pcp is Dr. Azucena Cecil of [facility].  System updated.

## 2013-08-27 NOTE — ED Notes (Signed)
Pt reports he wants to detox from ETOH, denies drug use, also states SI with a plan to cut himself, or shoot himself or jump in front of a car, reports previous SI attempts. Pt tearful in triage, cooperative at this time.

## 2013-08-27 NOTE — ED Notes (Addendum)
Pt belongings consist of 1 pair of jean shorts,burgundy belt, burgundy tshirt, white tshirt, sneakers, white socks, blue hoodie jacket.
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
## 2013-08-28 ENCOUNTER — Inpatient Hospital Stay (HOSPITAL_COMMUNITY)
Admission: AD | Admit: 2013-08-28 | Discharge: 2013-09-03 | DRG: 897 | Disposition: A | Discharge status: Home / Self Care | Payer: Medicaid Other | Source: Intra-hospital | Attending: Psychiatry | Admitting: Psychiatry

## 2013-08-28 ENCOUNTER — Encounter (HOSPITAL_COMMUNITY): Payer: Self-pay | Admitting: *Deleted

## 2013-08-28 ENCOUNTER — Encounter (HOSPITAL_COMMUNITY): Payer: Self-pay | Admitting: Emergency Medicine

## 2013-08-28 DIAGNOSIS — F332 Major depressive disorder, recurrent severe without psychotic features: Secondary | ICD-10-CM | POA: Diagnosis present

## 2013-08-28 DIAGNOSIS — Z79899 Other long term (current) drug therapy: Secondary | ICD-10-CM

## 2013-08-28 DIAGNOSIS — R569 Unspecified convulsions: Secondary | ICD-10-CM | POA: Diagnosis present

## 2013-08-28 DIAGNOSIS — F410 Panic disorder [episodic paroxysmal anxiety] without agoraphobia: Secondary | ICD-10-CM | POA: Diagnosis present

## 2013-08-28 DIAGNOSIS — F319 Bipolar disorder, unspecified: Secondary | ICD-10-CM | POA: Diagnosis present

## 2013-08-28 DIAGNOSIS — F411 Generalized anxiety disorder: Secondary | ICD-10-CM | POA: Diagnosis present

## 2013-08-28 DIAGNOSIS — F1020 Alcohol dependence, uncomplicated: Principal | ICD-10-CM | POA: Diagnosis present

## 2013-08-28 DIAGNOSIS — F1320 Sedative, hypnotic or anxiolytic dependence, uncomplicated: Secondary | ICD-10-CM | POA: Diagnosis present

## 2013-08-28 DIAGNOSIS — R45851 Suicidal ideations: Secondary | ICD-10-CM

## 2013-08-28 DIAGNOSIS — F314 Bipolar disorder, current episode depressed, severe, without psychotic features: Secondary | ICD-10-CM

## 2013-08-28 MED ORDER — ACETAMINOPHEN 325 MG PO TABS
650.0000 mg | ORAL_TABLET | Freq: Four times a day (QID) | ORAL | Status: DC | PRN
Start: 2013-08-28 — End: 2013-09-03

## 2013-08-28 MED ORDER — ACETAMINOPHEN 325 MG PO TABS: 650.0000 mg | ORAL_TABLET | ORAL | Status: DC | PRN

## 2013-08-28 MED ORDER — CLONAZEPAM 0.5 MG PO TABS
0.5000 mg | ORAL_TABLET | Freq: Two times a day (BID) | ORAL | Status: DC | PRN
  Administered 2013-08-28: 0.5 mg via ORAL
  Filled 2013-08-28: qty 1

## 2013-08-28 MED ORDER — ONDANSETRON HCL 4 MG PO TABS: 4.0000 mg | ORAL_TABLET | Freq: Three times a day (TID) | ORAL | Status: DC | PRN

## 2013-08-28 MED ORDER — TRAZODONE HCL 100 MG PO TABS
100.0000 mg | ORAL_TABLET | Freq: Every evening | ORAL | Status: DC | PRN
  Filled 2013-08-28: qty 14
  Filled 2013-08-28: qty 1

## 2013-08-28 MED ORDER — ALUM & MAG HYDROXIDE-SIMETH 200-200-20 MG/5ML PO SUSP: 30.0000 mL | ORAL | Status: DC | PRN

## 2013-08-28 MED ORDER — TRAZODONE HCL 100 MG PO TABS: 100.0000 mg | ORAL_TABLET | Freq: Every evening | ORAL | Status: DC | PRN

## 2013-08-28 MED ORDER — LAMOTRIGINE 100 MG PO TABS
100.0000 mg | ORAL_TABLET | Freq: Two times a day (BID) | ORAL | Status: DC
  Administered 2013-08-28 – 2013-09-03 (×12): 100 mg via ORAL
  Filled 2013-08-28: qty 28
  Filled 2013-08-28 (×3): qty 1
  Filled 2013-08-28: qty 28
  Filled 2013-08-28 (×12): qty 1

## 2013-08-28 MED ORDER — LAMOTRIGINE 100 MG PO TABS
100.0000 mg | ORAL_TABLET | Freq: Two times a day (BID) | ORAL | Status: DC
Start: 2013-08-28 — End: 2013-08-28
  Filled 2013-08-28: qty 1

## 2013-08-28 MED ORDER — CLONAZEPAM 0.5 MG PO TABS
0.5000 mg | ORAL_TABLET | Freq: Two times a day (BID) | ORAL | Status: DC | PRN
  Administered 2013-08-28 – 2013-08-29 (×2): 0.5 mg via ORAL
  Filled 2013-08-28 (×2): qty 1

## 2013-08-28 MED ORDER — NICOTINE 21 MG/24HR TD PT24
21.0000 mg | MEDICATED_PATCH | Freq: Every day | TRANSDERMAL | Status: DC
  Administered 2013-08-29 – 2013-09-03 (×6): 21 mg via TRANSDERMAL
  Filled 2013-08-28 (×6): qty 1
  Filled 2013-08-28: qty 14
  Filled 2013-08-28 (×2): qty 1

## 2013-08-28 MED ORDER — IBUPROFEN 600 MG PO TABS
600.0000 mg | ORAL_TABLET | Freq: Three times a day (TID) | ORAL | Status: DC | PRN
  Administered 2013-08-30 – 2013-09-03 (×7): 600 mg via ORAL
  Filled 2013-08-28 (×7): qty 1

## 2013-08-28 MED ORDER — MAGNESIUM HYDROXIDE 400 MG/5ML PO SUSP: 30.0000 mL | Freq: Every day | ORAL | Status: DC | PRN

## 2013-08-28 NOTE — Tx Team (Signed)
Initial Interdisciplinary Treatment Plan  PATIENT STRENGTHS: (choose at least two) Ability for insight Average or above average intelligence Capable of independent living General fund of knowledge Motivation for treatment/growth Supportive family/friends  PATIENT STRESSORS: Financial difficulties Marital or family conflict Substance abuse   PROBLEM LIST: Problem List/Patient Goals Date to be addressed Date deferred Reason deferred Estimated date of resolution  Alcohol abuse      Depression-break-up with child's mother and child was taken by CPS      Risk for self harm                                           DISCHARGE CRITERIA:  Ability to meet basic life and health needs Improved stabilization in mood, thinking, and/or behavior Motivation to continue treatment in a less acute level of care Verbal commitment to aftercare and medication compliance Withdrawal symptoms are absent or subacute and managed without 24-hour nursing intervention  PRELIMINARY DISCHARGE PLAN: Attend PHP/IOP Attend 12-step recovery group Outpatient therapy  PATIENT/FAMIILY INVOLVEMENT: This treatment plan has been presented to and reviewed with the patient, [name], and/or family member.  The patient and family have been given the opportunity to ask questions and make suggestions.  [name] [facility] 08/28/2013, 8:51 PM
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
## 2013-08-28 NOTE — BH Assessment (Signed)
[facility] Assessment Progress Note  Pt accepted to Rm 302-1 per Thurman Coyer, RN, [facility].  Janetta Hora Lomax, TS notified.  Doylene Canning, MA Triage Specialist 08/28/2013 @ 16:33

## 2013-08-28 NOTE — ED Notes (Signed)
Pt has eye contacts at bedside

## 2013-08-28 NOTE — Consult Note (Signed)
[facility] Face-to-Face Psychiatry Consult   Reason for Consult:  Suicidal ideation and threats Referring Physician:  ER MD  [name] is an 37 y.o. male.  Assessment: AXIS I:  Major Depression, Recurrent severe AXIS II:  Deferred AXIS III:   Past Medical History  Diagnosis Date  . Seizures    AXIS IV:  economic problems, occupational problems, other psychosocial or environmental problems and problems with primary support group AXIS V:  41-50 serious symptoms  Plan:  Recommend psychiatric Inpatient admission when medically cleared.  Subjective:   [name] is a 37 y.o. male patient admitted with suicidal ideation.  HPI:  [name] was inpatient earlier in November.  He was supposed to go to [facility] for rehab but did not go, he said.  He remains depressed and suicidal with a plan to kill himself.  He says he has lots of stressors including breaking up with the mother of his 18 year old daughter who has been taken away by CPS as well as losing his job.  He feels hopeless and wants to die.     Past Psychiatric History: Past Medical History  Diagnosis Date  . Seizures     reports that he has been smoking.  He has never used smokeless tobacco. He reports that he drinks about 7.2 ounces of alcohol per week. He reports that he uses illicit drugs (Marijuana). History reviewed. No pertinent family history.         Allergies:   Allergies  Allergen Reactions  . Haloperidol And Related Other (See Comments)    Locks up muscles    ACT Assessment Complete:  Yes:    Educational Status    Risk to Self: Risk to self Is patient at risk for suicide?: Yes Substance abuse history and/or treatment for substance abuse?: Yes  Risk to Others:    Abuse:    Prior Inpatient Therapy:    Prior Outpatient Therapy:    Additional Information:                    Objective: Blood pressure 113/63, pulse 84, temperature 97.7 F (36.5 C), temperature source Oral, resp. rate 18,  SpO2 96.00%.There is no weight on file to calculate BMI. Results for orders placed during the hospital encounter of 08/27/13 (from the past 72 hour(s))  ETHANOL     Status: Abnormal   Collection Time    08/27/13  8:30 PM      Result Value Range   Alcohol, Ethyl (B) 249 (*) 0 - 11 mg/dL   Comment:            LOWEST DETECTABLE LIMIT FOR     SERUM ALCOHOL IS 11 mg/dL     FOR MEDICAL PURPOSES ONLY  ACETAMINOPHEN LEVEL     Status: None   Collection Time    08/27/13  8:30 PM      Result Value Range   Acetaminophen (Tylenol), Serum <15.0  10 - 30 ug/mL   Comment:            THERAPEUTIC CONCENTRATIONS VARY     SIGNIFICANTLY. A RANGE OF 10-30     ug/mL MAY BE AN EFFECTIVE     CONCENTRATION FOR MANY PATIENTS.     HOWEVER, SOME ARE BEST TREATED     AT CONCENTRATIONS OUTSIDE THIS     RANGE.     ACETAMINOPHEN CONCENTRATIONS     >150 ug/mL AT 4 HOURS AFTER     INGESTION AND >50  ug/mL AT 12     HOURS AFTER INGESTION ARE     OFTEN ASSOCIATED WITH TOXIC     REACTIONS.  CBC     Status: None   Collection Time    08/27/13  8:30 PM      Result Value Range   WBC 8.5  4.0 - 10.5 K/uL   RBC 5.12  4.22 - 5.81 MIL/uL   Hemoglobin 16.2  13.0 - 17.0 g/dL   HCT 16.1  09.6 - 04.5 %   MCV 91.4  78.0 - 100.0 fL   MCH 31.6  26.0 - 34.0 pg   MCHC 34.6  30.0 - 36.0 g/dL   RDW 40.9  81.1 - 91.4 %   Platelets 228  150 - 400 K/uL  COMPREHENSIVE METABOLIC PANEL     Status: Abnormal   Collection Time    08/27/13  8:30 PM      Result Value Range   Sodium 141  135 - 145 mEq/L   Potassium 3.6  3.5 - 5.1 mEq/L   Chloride 104  96 - 112 mEq/L   CO2 26  19 - 32 mEq/L   Glucose, Bld 104 (*) 70 - 99 mg/dL   BUN 4 (*) 6 - 23 mg/dL   Creatinine, Ser 7.82  0.50 - 1.35 mg/dL   Calcium 8.4  8.4 - 95.6 mg/dL   Total Protein 7.1  6.0 - 8.3 g/dL   Albumin 3.7  3.5 - 5.2 g/dL   AST 213 (*) 0 - 37 U/L   ALT 424 (*) 0 - 53 U/L   Alkaline Phosphatase 101  39 - 117 U/L   Total Bilirubin 1.1  0.3 - 1.2 mg/dL   GFR  calc non Af Amer >90  >90 mL/min   GFR calc Af Amer >90  >90 mL/min   Comment: (NOTE)     The eGFR has been calculated using the CKD EPI equation.     This calculation has not been validated in all clinical situations.     eGFR's persistently <90 mL/min signify possible Chronic Kidney     Disease.  SALICYLATE LEVEL     Status: Abnormal   Collection Time    08/27/13  8:30 PM      Result Value Range   Salicylate Lvl <2.0 (*) 2.8 - 20.0 mg/dL  URINE RAPID DRUG SCREEN (HOSP PERFORMED)     Status: Abnormal   Collection Time    08/27/13  8:57 PM      Result Value Range   Opiates NONE DETECTED  NONE DETECTED   Cocaine NONE DETECTED  NONE DETECTED   Benzodiazepines POSITIVE (*) NONE DETECTED   Amphetamines NONE DETECTED  NONE DETECTED   Tetrahydrocannabinol NONE DETECTED  NONE DETECTED   Barbiturates NONE DETECTED  NONE DETECTED   Comment:            DRUG SCREEN FOR MEDICAL PURPOSES     ONLY.  IF CONFIRMATION IS NEEDED     FOR ANY PURPOSE, NOTIFY LAB     WITHIN 5 DAYS.                LOWEST DETECTABLE LIMITS     FOR URINE DRUG SCREEN     Drug Class       Cutoff (ng/mL)     Amphetamine      1000     Barbiturate      200     Benzodiazepine   200     Tricyclics  300     Opiates          300     Cocaine          300     THC              50   Labs are reviewed and are pertinent for alcohol  Current Facility-Administered Medications  Medication Dose Route Frequency Provider Last Rate Last Dose  . acetaminophen (TYLENOL) tablet 650 mg  650 mg Oral Q4H PRN Antony Madura, PA-C      . alum & mag hydroxide-simeth (MAALOX/MYLANTA) 200-200-20 MG/5ML suspension 30 mL  30 mL Oral PRN Antony Madura, PA-C      . ibuprofen (ADVIL,MOTRIN) tablet 600 mg  600 mg Oral Q8H PRN Antony Madura, PA-C      . LORazepam (ATIVAN) tablet 1 mg  1 mg Oral Q8H PRN Antony Madura, PA-C   1 mg at 08/28/13 1045  . nicotine (NICODERM CQ - dosed in mg/24 hours) patch 21 mg  21 mg Transdermal Daily Antony Madura, PA-C   21  mg at 08/28/13 1040  . ondansetron (ZOFRAN) tablet 4 mg  4 mg Oral Q8H PRN Antony Madura, PA-C      . zolpidem (AMBIEN) tablet 5 mg  5 mg Oral QHS PRN Antony Madura, PA-C       Current Outpatient Prescriptions  Medication Sig Dispense Refill  . acetaminophen (TYLENOL) 325 MG tablet Take 650 mg by mouth every 6 (six) hours as needed (pain).      . clonazePAM (KLONOPIN) 0.5 MG tablet Take 1 tablet (0.5 mg total) by mouth 2 (two) times daily. For anxiety.  60 tablet  0  . lamoTRIgine (LAMICTAL) 100 MG tablet Take 1 tablet (100 mg total) by mouth 2 (two) times daily. For mood stabilization and prevention of seizures.  60 tablet  0  . traZODone (DESYREL) 100 MG tablet Take 1 tablet (100 mg total) by mouth at bedtime. For insomnia.  30 tablet  0    Psychiatric Specialty Exam:     Blood pressure 113/63, pulse 84, temperature 97.7 F (36.5 C), temperature source Oral, resp. rate 18, SpO2 96.00%.There is no weight on file to calculate BMI.  General Appearance: Casual  Eye Contact::  Minimal  Speech:  Clear and Coherent  Volume:  Normal  Mood:  Depressed  Affect:  Appropriate  Thought Process:  Coherent and Logical  Orientation:  Full (Time, Place, and Person)  Thought Content:  Negative  Suicidal Thoughts:  Yes.  with intent/plan  Homicidal Thoughts:  No  Memory:  Immediate;   Good Recent;   Good Remote;   Good  Judgement:  Intact  Insight:  Fair  Psychomotor Activity:  Normal  Concentration:  Good  Recall:  Good  Akathisia:  Negative  Handed:  Right  AIMS (if indicated):     Assets:  Communication Skills Desire for Improvement  Sleep:      Treatment Plan Summary: Daily contact with patient to assess and evaluate symptoms and progress in treatment Medication management refer to inpatient for treatment for depression and SI as well as alcohol dependence  TAYLOR,GERALD D 08/28/2013 11:54 AM

## 2013-08-28 NOTE — ED Notes (Signed)
Patient complains of anxiety.  Positive hand tremors/moist palms.  No nausea, vomiting, headache, or hallucinations noted.  Given Ativan 1 mg PO.

## 2013-08-29 ENCOUNTER — Encounter (HOSPITAL_COMMUNITY): Payer: Self-pay | Admitting: Psychiatry

## 2013-08-29 DIAGNOSIS — F322 Major depressive disorder, single episode, severe without psychotic features: Secondary | ICD-10-CM

## 2013-08-29 DIAGNOSIS — F410 Panic disorder [episodic paroxysmal anxiety] without agoraphobia: Secondary | ICD-10-CM | POA: Diagnosis present

## 2013-08-29 DIAGNOSIS — F411 Generalized anxiety disorder: Secondary | ICD-10-CM | POA: Diagnosis present

## 2013-08-29 DIAGNOSIS — R45851 Suicidal ideations: Secondary | ICD-10-CM

## 2013-08-29 MED ORDER — CHLORDIAZEPOXIDE HCL 25 MG PO CAPS
25.0000 mg | ORAL_CAPSULE | Freq: Once | ORAL | Status: DC
  Administered 2013-08-29: 25 mg via ORAL

## 2013-08-29 MED ORDER — VITAMIN B-1 100 MG PO TABS
100.0000 mg | ORAL_TABLET | Freq: Every day | ORAL | Status: DC
  Administered 2013-08-30 – 2013-09-03 (×5): 100 mg via ORAL
  Filled 2013-08-29 (×7): qty 1

## 2013-08-29 MED ORDER — CHLORDIAZEPOXIDE HCL 25 MG PO CAPS
25.0000 mg | ORAL_CAPSULE | Freq: Four times a day (QID) | ORAL | Status: AC | PRN
  Administered 2013-08-31 (×2): 25 mg via ORAL
  Filled 2013-08-29 (×2): qty 1

## 2013-08-29 MED ORDER — CHLORDIAZEPOXIDE HCL 25 MG PO CAPS
25.0000 mg | ORAL_CAPSULE | Freq: Every day | ORAL | Status: AC
  Administered 2013-09-01: 25 mg via ORAL
  Filled 2013-08-29: qty 1

## 2013-08-29 MED ORDER — GABAPENTIN 300 MG PO CAPS
300.0000 mg | ORAL_CAPSULE | Freq: Three times a day (TID) | ORAL | Status: DC
  Administered 2013-08-29 – 2013-08-30 (×3): 300 mg via ORAL
  Filled 2013-08-29 (×8): qty 1

## 2013-08-29 MED ORDER — CHLORDIAZEPOXIDE HCL 25 MG PO CAPS
25.0000 mg | ORAL_CAPSULE | Freq: Three times a day (TID) | ORAL | Status: AC
  Administered 2013-08-30 (×3): 25 mg via ORAL
  Filled 2013-08-29 (×3): qty 1

## 2013-08-29 MED ORDER — CHLORDIAZEPOXIDE HCL 25 MG PO CAPS
ORAL_CAPSULE | ORAL | Status: AC
  Administered 2013-08-29: 25 mg via ORAL
  Filled 2013-08-29: qty 1

## 2013-08-29 MED ORDER — THIAMINE HCL 100 MG/ML IJ SOLN
100.0000 mg | Freq: Once | INTRAMUSCULAR | Status: AC
  Administered 2013-08-29: 100 mg via INTRAMUSCULAR
  Filled 2013-08-29: qty 2

## 2013-08-29 MED ORDER — CHLORDIAZEPOXIDE HCL 25 MG PO CAPS
25.0000 mg | ORAL_CAPSULE | Freq: Four times a day (QID) | ORAL | Status: AC
  Administered 2013-08-29 (×3): 25 mg via ORAL
  Filled 2013-08-29 (×3): qty 1

## 2013-08-29 MED ORDER — ONDANSETRON 4 MG PO TBDP: 4.0000 mg | ORAL_TABLET | Freq: Four times a day (QID) | ORAL | Status: AC | PRN

## 2013-08-29 MED ORDER — ENSURE COMPLETE PO LIQD
237.0000 mL | ORAL | Status: DC
  Administered 2013-08-29 – 2013-09-02 (×4): 237 mL via ORAL

## 2013-08-29 MED ORDER — GABAPENTIN 100 MG PO CAPS
100.0000 mg | ORAL_CAPSULE | Freq: Three times a day (TID) | ORAL | Status: DC
  Administered 2013-08-29: 100 mg via ORAL
  Filled 2013-08-29 (×7): qty 1

## 2013-08-29 MED ORDER — ADULT MULTIVITAMIN W/MINERALS CH
1.0000 | ORAL_TABLET | Freq: Every day | ORAL | Status: DC
  Administered 2013-08-29 – 2013-09-03 (×6): 1 via ORAL
  Filled 2013-08-29 (×10): qty 1

## 2013-08-29 MED ORDER — CHLORDIAZEPOXIDE HCL 25 MG PO CAPS
25.0000 mg | ORAL_CAPSULE | ORAL | Status: AC
  Administered 2013-08-31 (×2): 25 mg via ORAL
  Filled 2013-08-29 (×2): qty 1

## 2013-08-29 MED ORDER — LOPERAMIDE HCL 2 MG PO CAPS: 2.0000 mg | ORAL_CAPSULE | ORAL | Status: AC | PRN

## 2013-08-29 MED ORDER — HYDROXYZINE HCL 25 MG PO TABS
25.0000 mg | ORAL_TABLET | Freq: Four times a day (QID) | ORAL | Status: AC | PRN
  Administered 2013-08-29 – 2013-09-01 (×7): 25 mg via ORAL
  Filled 2013-08-29 (×9): qty 1
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
## 2013-08-29 NOTE — BHH Group Notes (Signed)
[facility] LCSW Aftercare Discharge Planning Group Note   08/29/2013 9:36 AM  Participation Quality:  DID NOT ATTEND   [name], Steven Quam

## 2013-08-29 NOTE — ED Provider Notes (Signed)
Medical screening examination/treatment/procedure(s) were performed by non-physician practitioner and as supervising physician I was immediately available for consultation/collaboration.  EKG Interpretation   None         Candyce Churn, MD 08/29/13 1219
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
## 2013-08-29 NOTE — Clinical Social Work Note (Signed)
CSW met individually with pt. Pt stated that he has phone interview with [facility] at [facility] CSW also provided pt with Contact info for [facility] in [place] per pt request. Pt stated that plan b is to get [facility] screening/admission date. Pt plans to follow up at [facility] for med management. He signed ROI for CPS case worker but refused to consent to family contact due to "problems going on in my family right now." SPE completed with pt.   [employer], LCSWA  08/29/2013 3:02 PM

## 2013-08-29 NOTE — Progress Notes (Signed)
NUTRITION ASSESSMENT  Pt identified as at risk on the Malnutrition Screen Tool  INTERVENTION: 1. Educated patient on the importance of nutrition and encouraged intake of food and beverages. 2. Discussed weight goals. 3. Supplements: Add Ensure Complete once daily, continue MVI and thiamine daily  NUTRITION DIAGNOSIS: Unintentional weight loss related to sub-optimal intake as evidenced by pt report.   Goal: Pt to meet >/= 90% of their estimated nutrition needs.  Monitor:  PO intake  Assessment:  Patient admitted with etoh abuse, depression and SI.  Just broke up with girlfriend and 2yo daughter taken by CPS.  Patient reports a UBW of 180 lbs approximately 1 year ago with decrease in weight secondary to increased etoh use.  Reports that appetite is returning and he is beginning to eat well.  37 y.o. male  Height: Ht Readings from Last 1 Encounters:  08/28/13 6\' 3"  (1.905 m)    Weight: Wt Readings from Last 1 Encounters:  08/28/13 164 lb 8 oz (74.617 kg)    Weight Hx: Wt Readings from Last 10 Encounters:  08/28/13 164 lb 8 oz (74.617 kg)  08/11/13 167 lb (75.751 kg)    BMI:  Body mass index is 20.56 kg/(m^2). Pt meets criteria for wnl based on current BMI.  Estimated Nutritional Needs: Kcal: 25-30 kcal/kg Protein: > 1 gram protein/kg Fluid: 1 ml/kcal  Diet Order: General Pt is also offered choice of unit snacks mid-morning and mid-afternoon.  Pt is eating as desired.   Lab results and medications reviewed.   Oran Rein, RD, LDN Clinical Inpatient Dietitian Pager:  [phone] Weekend and after hours pager:  [phone]

## 2013-08-29 NOTE — Progress Notes (Signed)
Patient ID: [name], male   DOB: 1976-08-09, 37 y.o.   MRN: [id number] He slept most of the AM and was up for remainder of shift. He has been interacting with  Peers and staff. He denies withdrawal symptoms.
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
## 2013-08-29 NOTE — Progress Notes (Signed)
Patient ID: [name], male   DOB: October 17, 1975, 37 y.o.   MRN: [id number] Pt came in late to the group and did not participate in the discussion.

## 2013-08-29 NOTE — [facility] Suicide Risk Assessment (Signed)
[facility] INPATIENT:  Family/Significant Other Suicide Prevention Education  Suicide Prevention Education:  Patient Refusal for Family/Significant Other Suicide Prevention Education: The patient [name] has refused to provide written consent for family/significant other to be provided Family/Significant Other Suicide Prevention Education during admission and/or prior to discharge.  Physician notified.  SPE completed with pt. SPI pamphlet provided to pt and he was encouraged to share information with his support network, ask questions, and talk about any concerns.    Smart, Mahdi Frye LCSWA  08/29/2013, 2:59 PM

## 2013-08-29 NOTE — Progress Notes (Signed)
Adult Psychoeducational Group Note  Date:  08/29/2013 Time:  9:53 PM  Group Topic/Focus:  NA MEETING  Participation Level:  Active  Participation Quality:  Appropriate  Affect:  Appropriate  Cognitive:  Appropriate  Insight: Appropriate  Engagement in Group:  Engaged  Modes of Intervention:  Support  Additional Comments:  Pt went to NA and was attentive  Shrihan Putt 08/29/2013, 9:53 PM

## 2013-08-29 NOTE — [facility] Group Notes (Signed)
[facility] LCSW Group Therapy  08/29/2013 3:22 PM  Type of Therapy:  Group Therapy  Participation Level:  Active  Participation Quality:  Attentive  Affect:  Depressed and Flat  Cognitive:  Alert and Oriented  Insight:  Improving  Engagement in Therapy:  Improving  Modes of Intervention:  Confrontation, Discussion, Education, Exploration, Limit-setting, Problem-solving, Rapport Building, Socialization and Support  Summary of Progress/Problems: Emotion Regulation: This group focused on both positive and negative emotion identification and allowed group members to process ways to identify feelings, regulate negative emotions, and find healthy ways to manage internal/external emotions. Group members were asked to reflect on a time when their reaction to an emotion led to a negative outcome and explored how alternative responses using emotion regulation would have benefited them. Group members were also asked to discuss a time when emotion regulation was utilized when a negative emotion was experienced. [name] was attentive and engaged throughout today's therapy group. [name] stated that he has struggled with "anxiety for my entire life." [name] reports that he uses alcohol to reduce feelings of anxiety and numb negative emotions. [name] demonstrates improving insight AEB his ability to process how his inability to adequately regulate anxiety drives his alcoholism and causes long term problems-loss of his relationship with fiance, loss of child to CPS, job loss, homelessness. [name] stated that he hoped to get into a [facility] based recovery house like [name] in order to reestablish structure and get the support he needs to help him cope with stressors and anxiety in a healthier way.    [name], [name] LCSWA  08/29/2013, 3:22 PM

## 2013-08-30 LAB — HEPATITIS PANEL, ACUTE
HCV Ab: REACTIVE — AB
Hep A IgM: NONREACTIVE
Hepatitis B Surface Ag: NEGATIVE

## 2013-08-30 MED ORDER — GABAPENTIN 400 MG PO CAPS
400.0000 mg | ORAL_CAPSULE | Freq: Three times a day (TID) | ORAL | Status: DC
  Administered 2013-08-30 – 2013-09-03 (×12): 400 mg via ORAL
  Filled 2013-08-30 (×3): qty 1
  Filled 2013-08-30: qty 42
  Filled 2013-08-30 (×5): qty 1
  Filled 2013-08-30 (×2): qty 42
  Filled 2013-08-30 (×8): qty 1

## 2013-08-30 NOTE — Progress Notes (Signed)
Recreation Therapy Notes  Date: 12.04.2014 Time: 3:00pm Location: [address] Dayroom   Group Topic: Communication, Team Building, Problem Solving  Goal Area(s) Addresses:  Patient will effectively work with peer towards shared goal.  Patient will identify skill used to make activity successful.  Patient will identify how skills used during activity can be used to reach post d/c goals.   Behavioral Response: Engaged, Attentive, Appropriate   Intervention: Problem Solving Activitiy  Activity: Life Boat. Patients were given a scenario about being on a sinking yacht. Patients were informed the yacht included 15 guest, 8 of which could be placed on the life boat, along with all group members. Individuals on guest list were of varying socioeconomic classes such as a Education officer, museum, Materials engineer, Midwife, Tree surgeon.   Education: Special educational needs teacher, Team Work, Scientist, physiological, Journalist, newspaper, Discharge Planning   Education Outcome: Acknowledges understanding  Clinical Observations/Feedback: Patient actively engaged in group activity, voicing his opinion and debating with group members appropriately. Patient identified qualities that guided his decision making and related theses skills to his recovery process. Patient additionally group skills used, communication and identified how each of these skills will be useful during his recovery process.   Marykay Lex Myalee Stengel, LRT/CTRS  Jearl Klinefelter 08/30/2013 3:53 PM

## 2013-08-30 NOTE — [facility] Group Notes (Signed)
[facility] LCSW Group Therapy  08/30/2013  1:15 PM   Type of Therapy:  Group Therapy  Participation Level:  Active  Participation Quality:  Attentive, Sharing and Supportive  Affect:  Flat and Depressed  Cognitive:  Alert and Oriented  Insight:  Developing/Improving and Engaged  Engagement in Therapy:  Developing/Improving and Engaged  Modes of Intervention:  Clarification, Confrontation, Discussion, Education, Exploration, Limit-setting, Orientation, Problem-solving, Rapport Building, Dance movement psychotherapist, Socialization and Support  Summary of Progress/Problems: The topic for group was balance in life.  Today's group focused on defining balance in one's own words, identifying things that can knock one off balance, and exploring healthy ways to maintain balance in life. Group members were asked to provide an example of a time when they felt off balance, describe how they handled that situation,and process healthier ways to regain balance in the future. Group members were asked to share the most important tool for maintaining balance that they learned while at [facility] and how they plan to apply this method after discharge.  Pt shared that balance for him mean stability in his life.  Pt states that drinking throws his life off balance and he is unable to function.  Pt states that his life was balanced years ago, when he held a job and went to church and was able to maintain sobriety for 3-4 years.  Pt states that he plans to get more inpatient treatment to restore balance in his life.  Pt actively participated and was engaged in group discussion.    Steven Ivan, LCSW 08/30/2013 2:18 PM

## 2013-08-30 NOTE — Progress Notes (Signed)
D: Patient denies SI/HI/AVH. Patient has a depressed mood and anxious affect.  He talked about how difficult the detox process is but expressed his commitment to it.  Pt. Rates his anxiety as 7-8/10.  A: Patient given emotional support from RN. Patient encouraged to come to staff with concerns and/or questions. Patient's medication routine continued. Patient's orders and plan of care reviewed.   R: Patient remains appropriate and cooperative. Will continue to monitor patient q15 minutes for safety.

## 2013-08-30 NOTE — Progress Notes (Signed)
Adult Psychoeducational Group Note  Date:  08/30/2013 Time:  3:20 PM  Group Topic/Focus:  Overcoming Stress:   The focus of this group is to define stress and help patients assess their triggers.  Participation Level: Observer  Participation Quality:  Alert  Affect:  Anxious and Depressed  Cognitive:  Alert and Appropriate  Insight: Good and Improving  Engagement in Group:  Engaged, Improving and Supportive  Modes of Intervention:  Discussion, Education and Support  Additional Comments:  Pt was feeling anxious about decisions he needs to make regarding where he will be living. Pt was more of an observer in group than a participant.   Reynolds Bowl 08/30/2013, 3:20 PM

## 2013-08-30 NOTE — Progress Notes (Signed)
[facility] Group Notes:  (Nursing/MHT/Case Management/Adjunct)  Date:  08/30/2013  Time:  9:39 AM  Type of Therapy:  Nurse Education  Participation Level:  Active  Participation Quality:  Attentive  Affect:  Flat  Cognitive:  Alert  Insight:  Limited  Engagement in Group:  Engaged  Modes of Intervention:  Activity, Discussion, Education, Exploration, Orientation, Socialization and Support  Summary of Progress/Problems: Pt had no comment on happiness but did mention he wants to get sober and get his family back. Pt made a daily goal of finding a long term treatment program to go to after discharge.   Cathlean Cower 08/30/2013, 9:39 AM

## 2013-08-30 NOTE — Progress Notes (Addendum)
[facility] MD Progress Note  08/30/2013 5:01 PM [name]  MRN:  [id number] Subjective:  [name] endorses that he continues to have a hard time. He is experiencing a lot of anxiety, worry. Can see how he builds himself with the worry to have a panic attack. He does not want to depend on the klonopin anymore. He is worried about getting off it. He is still very worried about the situation at home. Not sure if he can communicate with his girlfriend given DSS involvement. His Hep C screening was pos awaiting confirmation. He is worried about this now. Aware that he really needs to work hard on abstaining from drinking based on the effect of alcohol on his liver Diagnosis:   DSM5: Schizophrenia Disorders:  denies Obsessive-Compulsive Disorders:  denies Trauma-Stressor Disorders:  denies Substance/Addictive Disorders:  Alcohol Related Disorder - Severe (303.90) Depressive Disorders:  Major Depressive Disorder - Moderate (296.22)  Axis I: Generalized Anxiety Disorder and Panic Disorder  ADL's:  Intact  Sleep: Fair  Appetite:  Fair  Suicidal Ideation:  Plan:  denies Intent:  denies Means:  denies Homicidal Ideation:  Plan:  denies Intent:  denies Means:  denies AEB (as evidenced by):  Psychiatric Specialty Exam: Review of Systems  Constitutional: Negative.   HENT: Negative.   Eyes: Negative.   Respiratory: Negative.   Cardiovascular: Negative.   Gastrointestinal: Negative.   Genitourinary: Negative.   Musculoskeletal: Negative.   Skin: Negative.   Neurological: Negative.   Endo/Heme/Allergies: Negative.   Psychiatric/Behavioral: Positive for depression and substance abuse. The patient is nervous/anxious.     Blood pressure 137/80, pulse 111, temperature 97.9 F (36.6 C), temperature source Oral, resp. rate 16, height 6\' 3"  (1.905 m), weight 74.617 kg (164 lb 8 oz).Body mass index is 20.56 kg/(m^2).  General Appearance: Fairly Groomed  Patent attorney::  Fair  Speech:  Clear and  Coherent and Slow  Volume:  Decreased  Mood:  Anxious, Depressed and worried  Affect:  anxious, worried  Thought Process:  Coherent and Goal Directed  Orientation:  Full (Time, Place, and Person)  Thought Content:  worries, concerns  Suicidal Thoughts:  Intermittent  Homicidal Thoughts:  No  Memory:  Immediate;   Fair Recent;   Fair Remote;   Fair  Judgement:  Fair  Insight:  Shallow  Psychomotor Activity:  Restlessness  Concentration:  Fair  Recall:  Fair  Akathisia:  No  Handed:    AIMS (if indicated):     Assets:  Desire for Improvement  Sleep:  Number of Hours: 5.75   Current Medications: Current Facility-Administered Medications  Medication Dose Route Frequency Provider Last Rate Last Dose  . acetaminophen (TYLENOL) tablet 650 mg  650 mg Oral Q6H PRN Shuvon Rankin, NP      . alum & mag hydroxide-simeth (MAALOX/MYLANTA) 200-200-20 MG/5ML suspension 30 mL  30 mL Oral Q4H PRN Shuvon Rankin, NP      . chlordiazePOXIDE (LIBRIUM) capsule 25 mg  25 mg Oral Q6H PRN Rachael Fee, MD      . Melene Muller ON 08/31/2013] chlordiazePOXIDE (LIBRIUM) capsule 25 mg  25 mg Oral BH-qamhs Rachael Fee, MD       Followed by  . [START ON 09/01/2013] chlordiazePOXIDE (LIBRIUM) capsule 25 mg  25 mg Oral Daily Rachael Fee, MD      . feeding supplement (ENSURE COMPLETE) (ENSURE COMPLETE) liquid 237 mL  237 mL Oral Q24H Jeoffrey Massed, RD   237 mL at 08/29/13 2217  . gabapentin (  NEURONTIN) capsule 400 mg  400 mg Oral TID Rachael Fee, MD   400 mg at 08/30/13 1652  . hydrOXYzine (ATARAX/VISTARIL) tablet 25 mg  25 mg Oral Q6H PRN Rachael Fee, MD   25 mg at 08/30/13 [phone]  . ibuprofen (ADVIL,MOTRIN) tablet 600 mg  600 mg Oral Q8H PRN Shuvon Rankin, NP   600 mg at 08/30/13 0757  . lamoTRIgine (LAMICTAL) tablet 100 mg  100 mg Oral BID Shuvon Rankin, NP   100 mg at 08/30/13 1652  . loperamide (IMODIUM) capsule 2-4 mg  2-4 mg Oral PRN Rachael Fee, MD      . magnesium hydroxide (MILK OF MAGNESIA) suspension  30 mL  30 mL Oral Daily PRN Shuvon Rankin, NP      . multivitamin with minerals tablet 1 tablet  1 tablet Oral Daily Rachael Fee, MD   1 tablet at 08/30/13 0755  . nicotine (NICODERM CQ - dosed in mg/24 hours) patch 21 mg  21 mg Transdermal Daily Shuvon Rankin, NP   21 mg at 08/30/13 0756  . ondansetron (ZOFRAN) tablet 4 mg  4 mg Oral Q8H PRN Shuvon Rankin, NP      . ondansetron (ZOFRAN-ODT) disintegrating tablet 4 mg  4 mg Oral Q6H PRN Rachael Fee, MD      . thiamine (VITAMIN B-1) tablet 100 mg  100 mg Oral Daily Rachael Fee, MD   100 mg at 08/30/13 0755  . traZODone (DESYREL) tablet 100 mg  100 mg Oral QHS PRN Shuvon Rankin, NP        Lab Results:  Results for orders placed during the hospital encounter of 08/28/13 (from the past 48 hour(s))  HEPATITIS PANEL, ACUTE     Status: Abnormal   Collection Time    08/29/13  7:40 PM      Result Value Range   Hepatitis B Surface Ag NEGATIVE  NEGATIVE   HCV Ab Reactive (*) NEGATIVE   Comment: (NOTE)                                                                               This test is for screening purposes only.  Reactive results should be     confirmed by an alternative method.  Suggest HCV Qualitative, PCR,     test code 11914.  Specimens will be stable for reflex testing up to 3     days after collection.   Hep A IgM NON REACTIVE  NON REACTIVE   Hep B C IgM NON REACTIVE  NON REACTIVE   Comment: (NOTE)     High levels of Hepatitis B Core IgM antibody are detectable     during the acute stage of Hepatitis B. This antibody is used     to differentiate current from past HBV infection.     Performed at [facility]    Physical Findings: AIMS: Facial and Oral Movements Muscles of Facial Expression: None, normal Lips and Perioral Area: None, normal Jaw: None, normal Tongue: None, normal,Extremity Movements Upper (arms, wrists, hands, fingers): None, normal Lower (legs, knees, ankles, toes): None, normal, Trunk  Movements Neck, shoulders, hips: None, normal, Overall Severity Severity of abnormal  movements (highest score from questions above): None, normal Incapacitation due to abnormal movements: None, normal Patient's awareness of abnormal movements (rate only patient's report): No Awareness, Dental Status Current problems with teeth and/or dentures?: No Does patient usually wear dentures?: No  CIWA:  CIWA-Ar Total: 2 COWS:     Treatment Plan Summary: Daily contact with patient to assess and evaluate symptoms and progress in treatment Medication management  Plan: Supportive approach/coping skills/relapse prevention           CBT;mindfulness           Increase the Neurontin to 400 mg TID  Medical Decision Making Problem Points:  Review of psycho-social stressors (1) Data Points:  Review of medication regiment & side effects (2) Review of new medications or change in dosage (2)  I certify that inpatient services furnished can reasonably be expected to improve the patient's condition.   Liviah Cake A 08/30/2013, 5:01 PM

## 2013-08-30 NOTE — Progress Notes (Signed)
Patient ID: [name], male   DOB: 1976-04-17, 37 y.o.   MRN: [id number] He has been up and to groups interacting with peers and staff. Requested and received prn for back pain that was effective.  After AA group this AM he was anxious d/t what he was thinking about all his problems and where he would be going after discharge. Medication given and he has been calm.

## 2013-08-30 NOTE — Progress Notes (Signed)
Patient did attend the evening karaoke group. Pt was engaged, supportive, and participated by singing two songs with a group of his peers.

## 2013-08-30 NOTE — Progress Notes (Signed)
Patient ID: [name], male   DOB: 1976/05/14, 37 y.o.   MRN: [id number] Pt attended the 10am AA meeting.

## 2013-08-31 LAB — HEPATITIS C VRS RNA DETECT BY PCR-QUAL: Hepatitis C Vrs RNA by PCR-Qual: POSITIVE — AB

## 2013-08-31 NOTE — Progress Notes (Signed)
Patient ID: [name], male   DOB: [birth year], 37 y.o.   MRN: [id number]  D: Pt denies SI/HI/AVH. Pt is pleasant and cooperative. Pt stated "I've been kind of spaced and disoriented, I got some good news today, they accepted me in [facility] house. I would like for a Child psychotherapist or someone to call them" Pt. Concerned about possible Hep C  A: Pt was offered support and encouragement. Pt was given scheduled medications. Pt was encourage to attend groups. Q 15 minute checks were done for safety.   R:Pt attends groups and interacts well with peers and staff. Pt is taking medication. Pt has no complaints at this time.Pt receptive to treatment and safety maintained on unit.

## 2013-08-31 NOTE — [facility] Group Notes (Signed)
[facility] LCSW Aftercare Discharge Planning Group Note   08/31/2013 8:45 AM  Participation Quality:  Alert and Appropriate   Mood/Affect:  Flat and Depressed  Depression Rating:  2  Anxiety Rating:  7  Thoughts of Suicide:  Pt denies SI/HI  Will you contract for safety?   Yes  Current AVH:  Pt denies  Plan for Discharge/Comments:  Pt attended discharge planning group and actively participated in group.  CSW provided pt with today's workbook.  Pt reports having an interview with [facility], recovery home, today at 11:30 am.  Pt reports not having a back up plan if this doesn't work out.  Pt states that he is from [facility] but can't return there.  CSW to assess for appropriate referrals.  No further needs voiced by pt at this time.    Transportation Means: Pt reports access to transportation  Supports: No supports mentioned at this time  Reyes Ivan, LCSW 08/31/2013 10:27 AM

## 2013-08-31 NOTE — Progress Notes (Signed)
Adult Psychoeducational Group Note  Date:  08/31/2013 Time:  2:35 PM  Group Topic/Focus:  Early Warning Signs:   The focus of this group is to help patients identify signs or symptoms they exhibit before slipping into an unhealthy state or crisis.  Participation Level:  Active  Participation Quality:  Appropriate and Attentive  Affect:  Flat  Cognitive:  Alert  Insight: Good  Engagement in Group:  Engaged  Modes of Intervention:  Activity, Discussion, Education, Exploration, Socialization and Support  Additional Comments:  Pt came to group and shared that being stressed and anxious are his two early warning signs for relapse. Pt plans on using playing the guitar and removing himself from negative situations as coping skills for relapse prevention.     Cathlean Cower 08/31/2013, 2:35 PM

## 2013-08-31 NOTE — Clinical Social Work Note (Signed)
Writer met with patient who advised he has been accepted to [facility] and hopes to discharge there early next weeks.  Patient did not identify any social work needs at this time.

## 2013-08-31 NOTE — [facility] Group Notes (Signed)
[facility] LCSW Group Therapy  08/31/2013  1:15 PM   Type of Therapy:  Group Therapy  Participation Level:  Active  Participation Quality:  Attentive, Sharing and Supportive  Affect:  Calm and Appropriate  Cognitive:  Alert and Oriented  Insight:  Developing/Improving and Engaged  Engagement in Therapy:  Developing/Improving and Engaged  Modes of Intervention:  Clarification, Confrontation, Discussion, Education, Exploration, Limit-setting, Orientation, Problem-solving, Rapport Building, Dance movement psychotherapist, Socialization and Support  Summary of Progress/Problems: The topic for today was feelings about relapse.  Pt discussed what relapse prevention is to them and identified triggers that they are on the path to relapse.  Pt processed their feeling towards relapse and was able to relate to peers.  Pt discussed coping skills that can be used for relapse prevention.  Pt shared that relapse for him is scary.  Pt states that he utilizes his faith to overcome alcoholism and reports he was able to maintain sobriety for 3-4 years through his faith.  Pt states that his triggers are having money and having anxiety.  Pt actively participated and was engaged in group discussion.    Reyes Ivan, LCSW 08/31/2013 2:51 PM

## 2013-08-31 NOTE — Progress Notes (Signed)
D:  Per pt self inventory pt reports sleeping fair, appetite good, energy level normal, ability to pay attention normal, rates depression at a 3 out of 10 and hopelessness at a 5 out of 10, pt is flat/anxious, denies SI/HI/AVH, says that he is anxious about his Hep C results, states that they took blood to see if he has it last night and he is worried that it will be positive, however pt is hopeful because he had a phone interview with [facility] today and he has been accepted.   A:  Emotional support provided, Encouraged pt to continue with treatment plan and attend all group activities, q15 min checks maintained for safety.  R:  Pt is receptive and cooperative, pleasant with staff and other patients on the unit.

## 2013-08-31 NOTE — Progress Notes (Addendum)
[facility] MD Progress Note  08/31/2013 3:46 PM SAMAJ WESSELLS  MRN:  [id number] Subjective:  [name] continues to have a hard time. States he is experiencing a lot of anxiety, worry. He is now worried about his Hep C result. He was accepted to the [facility]. This has decreased the anxiety as the uncertainty of where to go was causing even more anxiety. He knows he cant depend on the Klonopin anymore. He has some residual tremor " feeling shaky" uncomfortable. He is aware that once he starts worrying he cant stop.  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  HENT: Negative.   Eyes: Negative.   Respiratory: Negative.   Cardiovascular: Negative.   Gastrointestinal: Negative.   Genitourinary: Negative.   Musculoskeletal: Negative.   Skin: Negative.   Neurological: Positive for tremors and weakness.  Endo/Heme/Allergies: Negative.   Psychiatric/Behavioral: Positive for depression and substance abuse. The patient is nervous/anxious and has insomnia.     Blood pressure 139/77, pulse 102, temperature 97.9 F (36.6 C), temperature source Oral, resp. rate 16, height 6\' 3"  (1.905 m), weight 74.617 kg (164 lb 8 oz).Body mass index is 20.56 kg/(m^2).  General Appearance: Fairly Groomed  Patent attorney::  Fair  Speech:  Slow and Slurred  Volume:  Decreased  Mood:   Anxious and worried, overwhelmed  Affect:  anxious, worried  Thought Process:  Coherent and Goal Directed  Orientation:  Full (Time, Place, and Person)  Thought Content:  Rumination and worries, concerns  Suicidal Thoughts:  No  Homicidal Thoughts:  No  Memory:  Immediate;   Fair Recent;   Fair Remote;   Fair  Judgement:  Fair  Insight:  Present and superficial  Psychomotor Activity:  Restlessness  Concentration:  Fair  Recall:  Fair  Akathisia:  NA  Handed:    AIMS (if indicated):     Assets:  Desire for Improvement  Sleep:  Number of Hours: 5.5   Current Medications: Current Facility-Administered Medications  Medication Dose Route Frequency Provider Last Rate Last Dose  . acetaminophen (TYLENOL) tablet 650 mg  650 mg Oral Q6H PRN Shuvon Rankin, NP      . alum & mag hydroxide-simeth (MAALOX/MYLANTA) 200-200-20 MG/5ML suspension 30 mL  30 mL Oral Q4H PRN Shuvon Rankin, NP      . chlordiazePOXIDE (LIBRIUM) capsule 25 mg  25 mg Oral Q6H PRN Rachael Fee, MD      . chlordiazePOXIDE (LIBRIUM) capsule 25 mg  25 mg Oral BH-qamhs Rachael Fee, MD   25 mg at 08/31/13 0758   Followed by  . [START ON 09/01/2013] chlordiazePOXIDE (LIBRIUM) capsule 25 mg  25 mg Oral Daily Rachael Fee, MD      . feeding supplement (ENSURE COMPLETE) (ENSURE COMPLETE) liquid 237 mL  237 mL Oral Q24H Jeoffrey Massed, RD   237 mL at 08/29/13 2217  . gabapentin (NEURONTIN) capsule 400 mg  400 mg Oral TID Reymundo Poll  Dub Mikes, MD   400 mg at 08/31/13 1206  . hydrOXYzine (ATARAX/VISTARIL) tablet 25 mg  25 mg Oral Q6H PRN Rachael Fee, MD   25 mg at 08/31/13 1207  . ibuprofen (ADVIL,MOTRIN) tablet 600 mg  600 mg Oral Q8H PRN Shuvon Rankin, NP   600 mg at 08/30/13 1953  . lamoTRIgine (LAMICTAL) tablet 100 mg  100 mg Oral BID Shuvon Rankin, NP   100 mg at 08/31/13 0758  . loperamide (IMODIUM) capsule 2-4 mg  2-4 mg Oral PRN Rachael Fee, MD      . magnesium hydroxide (MILK OF MAGNESIA) suspension 30 mL  30 mL Oral Daily  PRN Shuvon Rankin, NP      . multivitamin with minerals tablet 1 tablet  1 tablet Oral Daily Rachael Fee, MD   1 tablet at 08/31/13 [phone]  . nicotine (NICODERM CQ - dosed in mg/24 hours) patch 21 mg  21 mg Transdermal Daily Shuvon Rankin, NP   21 mg at 08/31/13 0800  . ondansetron (ZOFRAN) tablet 4 mg  4 mg Oral Q8H PRN Shuvon Rankin, NP      . ondansetron (ZOFRAN-ODT) disintegrating tablet 4 mg  4 mg Oral Q6H PRN Rachael Fee, MD      . thiamine (VITAMIN B-1) tablet 100 mg  100 mg Oral Daily Rachael Fee, MD   100 mg at 08/31/13 0758  . traZODone (DESYREL) tablet 100 mg  100 mg Oral QHS PRN Shuvon Rankin, NP        Lab Results:  Results for orders placed during the hospital encounter of 08/28/13 (from the past 48 hour(s))  HEPATITIS PANEL, ACUTE     Status: Abnormal   Collection Time    08/29/13  7:40 PM      Result Value Range   Hepatitis B Surface Ag NEGATIVE  NEGATIVE   HCV Ab Reactive (*) NEGATIVE   Comment: (NOTE)                                                                               This test is for screening purposes only.  Reactive results should be     confirmed by an alternative method.  Suggest HCV Qualitative, PCR,     test code 30865.  Specimens will be stable for reflex testing up to 3     days after collection.   Hep A IgM NON REACTIVE  NON REACTIVE   Hep B C IgM NON REACTIVE  NON REACTIVE   Comment: (NOTE)     High levels of Hepatitis B Core IgM antibody are detectable     during the acute stage of Hepatitis B. This antibody is used     to differentiate current from past HBV infection.     Performed at [facility]  HEPATITIS C VRS RNA DETECT BY PCR-QUAL     Status: Abnormal   Collection Time    08/30/13  8:10 PM      Result Value Range   Hepatitis C Vrs RNA by PCR-Qual POSITIVE (*) Negative   Comment: (NOTE)     The lower limit of detection for Hepatitis C RNA by PCR is 15 IU/mL.  The performance characteristics of the Roche TaqMan HCV test  were         determined by [facility]. It has not been cleared or     approved by the U.S. Food and Drug Administration (FDA). The FDA has     determined that such clearance or approval is not necessary. This test     is used for clinical purposes. It should not be regarded as     investigational or for research. This laboratory is certified under     the Clinical Laboratory Improvement Amendments of 1988 (CLIA-88) as     qualified to perform high complexity testing     Performed at [facility]    Physical Findings: AIMS: Facial and Oral Movements Muscles of Facial Expression: None, normal Lips and Perioral Area: None, normal Jaw: None, normal Tongue: None, normal,Extremity Movements Upper (arms, wrists, hands, fingers): None, normal Lower (legs, knees, ankles, toes): None, normal, Trunk Movements Neck, shoulders, hips: None, normal, Overall Severity Severity of abnormal movements (highest score from questions above): None, normal Incapacitation due to abnormal movements: None, normal Patient's awareness of abnormal movements (rate only patient's report): No Awareness, Dental Status Current problems with teeth and/or dentures?: No Does patient usually wear dentures?: No  CIWA:  CIWA-Ar Total: 2 COWS:     Treatment Plan Summary: Daily contact with patient to assess and evaluate symptoms and progress in treatment Medication management  Plan: Supportive approach/coping skills/relapse prevention           Continue Librium Detox protocol           CBT ;mindfuless, stress management           Optimize treatment with psychotropics            Give handout on Hep C   Medical Decision Making Problem Points:  Review of psycho-social stressors (1) Data Points:  Review of medication regiment & side effects (2) Review of new medications or change in dosage (2)  I certify that inpatient services furnished can reasonably be expected to improve the patient's condition.    Kassandra Meriweather A 08/31/2013, 3:46 PM

## 2013-08-31 NOTE — [facility] Group Notes (Signed)
Adult Psychoeducational Group Note  Date:  08/31/2013 Time:  10:08 PM  Group Topic/Focus:  AA Meeting  Participation Level:  Active  Participation Quality:  Appropriate  Affect:  Appropriate  Cognitive:  Appropriate  Insight: Appropriate  Engagement in Group:  Engaged  Modes of Intervention:  Discussion and Education  Additional Comments:  Maleak attended [employer].  Caroll Rancher A 08/31/2013, 10:08 PM

## 2013-09-01 DIAGNOSIS — F1310 Sedative, hypnotic or anxiolytic abuse, uncomplicated: Secondary | ICD-10-CM

## 2013-09-01 DIAGNOSIS — F410 Panic disorder [episodic paroxysmal anxiety] without agoraphobia: Secondary | ICD-10-CM

## 2013-09-01 DIAGNOSIS — F411 Generalized anxiety disorder: Secondary | ICD-10-CM

## 2013-09-01 DIAGNOSIS — F321 Major depressive disorder, single episode, moderate: Secondary | ICD-10-CM

## 2013-09-01 DIAGNOSIS — F1020 Alcohol dependence, uncomplicated: Principal | ICD-10-CM

## 2013-09-01 MED ORDER — HYDROXYZINE HCL 25 MG PO TABS
25.0000 mg | ORAL_TABLET | Freq: Three times a day (TID) | ORAL | Status: DC | PRN
  Administered 2013-09-01 – 2013-09-02 (×4): 25 mg via ORAL
  Filled 2013-09-01 (×3): qty 1

## 2013-09-01 NOTE — Progress Notes (Signed)
D:  Per pt self inventory pt reports appetite good, energy level normal, ability to pay attention improving, rates depression at 2 out of 10 and hopelessness at a 5 out of 10, denies SI/HI/AVH, pt c/o anxiety and feeling "foggy headed", feels like he is about to  "jump out of my skin".  A:  NP made aware of pt complaints, Emotional support provided, Encouraged pt to continue with treatment plan and attend all group activities, q15 min checks maintained for safety.  R:  Pt is receptive, going to groups, participates minimally with encouragement, pt is pleasant to staff and other patients can be needy at times.

## 2013-09-01 NOTE — Progress Notes (Signed)
D . Pt pleasant but anxious on approach, denies other complaints at this time.  Positive for evening wrap up group, see group notes.  Interacting appropriately on unit.  Denies SI/HI/hallucinations at this time.  A.  Support and encouragement offered  R.  Pt remains safe on unit, will continue to monitor.

## 2013-09-01 NOTE — [facility] Group Notes (Signed)
[facility] Group Notes:  (Clinical Social Work)  09/01/2013     10-11AM  Summary of Progress/Problems:   The main focus of today's process group was for the patient to identify ways in which they have in the past sabotaged their own recovery. Motivational Interviewing was utilized to ask the group members what they get out of their substance use, and what reasons they may have for wanting to change.  The Stages of Change were explained using a handout, and patients identified where they currently are with regard to stages of change.  The patient expressed that he is in the hospital to detox from alcohol and other drugs, and stated he uses in order to numb his negative feelings and pain.  He also talked about how he actually hates the taste of alcohol, and has to choke it down to get the effects he desires.  Type of Therapy:  Group Therapy - Process   Participation Level:  Active  Participation Quality:  Attentive and Sharing  Affect:  Anxious, Blunted and Depressed  Cognitive:  Oriented  Insight:  Engaged  Engagement in Therapy:  Engaged  Modes of Intervention:  Education, Teacher, English as a foreign language, Motivational Interviewing  Ambrose Mantle, LCSW 09/01/2013, 12:16 PM

## 2013-09-01 NOTE — Progress Notes (Signed)
Patient ID: [name], male   DOB: 1976-01-16, 37 y.o.   MRN: [id number] [facility] MD Progress Note  09/01/2013 3:16 PM MARKAVIOUS MICCO  MRN:  [id number]  Subjective:  Matson continues to have a hard time. He says he is having some issues that are hard to explain. He states that he feels like foggy-headed, kind of disoriented with increased anxiety. Would like to have something for anxiety since he completed the Librium detox.  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  HENT: Negative.   Eyes: Negative.   Respiratory: Negative.   Cardiovascular: Negative.   Gastrointestinal: Negative.   Genitourinary: Negative.   Musculoskeletal: Negative.   Skin: Negative.   Neurological: Positive for tremors and weakness.  Endo/Heme/Allergies: Negative.   Psychiatric/Behavioral: Positive for depression and substance abuse. The patient is nervous/anxious and has insomnia.     Blood pressure 105/70, pulse 109, temperature 97.4 F (36.3 C), temperature source Oral, resp. rate 18, height 6\' 3"  (1.905 m), weight 74.617 kg (164 lb 8 oz).Body mass index is 20.56 kg/(m^2).  General Appearance: Fairly Groomed  Patent attorney::  Fair  Speech:  Slow and Slurred  Volume:  Decreased  Mood:  Anxious and worried, overwhelmed  Affect:  anxious, worried  Thought Process:  Coherent  and Goal Directed  Orientation:  Full (Time, Place, and Person)  Thought Content:  Rumination and worries, concerns  Suicidal Thoughts:  No  Homicidal Thoughts:  No  Memory:  Immediate;   Fair Recent;   Fair Remote;   Fair  Judgement:  Fair  Insight:  Present and superficial  Psychomotor Activity:  Restlessness  Concentration:  Fair  Recall:  Fair  Akathisia:  NA  Handed:    AIMS (if indicated):     Assets:  Desire for Improvement  Sleep:  Number of Hours: 6.25   Current Medications: Current Facility-Administered Medications  Medication Dose Route Frequency Provider Last Rate Last Dose  . acetaminophen (TYLENOL) tablet 650 mg  650 mg Oral Q6H PRN Shuvon Rankin, NP      . alum & mag hydroxide-simeth (MAALOX/MYLANTA) 200-200-20 MG/5ML suspension 30 mL  30 mL Oral Q4H PRN Shuvon Rankin, NP      . feeding supplement (ENSURE COMPLETE) (ENSURE COMPLETE) liquid 237 mL  237 mL Oral Q24H Jeoffrey Massed, RD   237 mL at 08/31/13 1937  . gabapentin (NEURONTIN) capsule 400 mg  400 mg Oral TID Rachael Fee, MD   400 mg at 09/01/13 1155  . hydrOXYzine (ATARAX/VISTARIL) tablet 25 mg  25 mg Oral TID PRN Sanjuana Kava, NP   25 mg at 09/01/13 1432  . ibuprofen (ADVIL,MOTRIN) tablet 600 mg  600 mg Oral Q8H PRN Shuvon Rankin, NP   600 mg at 09/01/13 0643  . lamoTRIgine (LAMICTAL) tablet 100 mg  100 mg Oral BID Shuvon Rankin, NP   100 mg at 09/01/13 0806  . magnesium hydroxide (MILK OF MAGNESIA) suspension 30  mL  30 mL Oral Daily PRN Shuvon Rankin, NP      . multivitamin with minerals tablet 1 tablet  1 tablet Oral Daily Rachael Fee, MD   1 tablet at 09/01/13 [phone]  . nicotine (NICODERM CQ - dosed in mg/24 hours) patch 21 mg  21 mg Transdermal Daily Shuvon Rankin, NP   21 mg at 09/01/13 0806  . ondansetron (ZOFRAN) tablet 4 mg  4 mg Oral Q8H PRN Shuvon Rankin, NP      . thiamine (VITAMIN B-1) tablet 100 mg  100 mg Oral Daily Rachael Fee, MD   100 mg at 09/01/13 0806  . traZODone (DESYREL) tablet 100  mg  100 mg Oral QHS PRN Shuvon Rankin, NP        Lab Results:  Results for orders placed during the hospital encounter of 08/28/13 (from the past 48 hour(s))  HEPATITIS C VRS RNA DETECT BY PCR-QUAL     Status: Abnormal   Collection Time    08/30/13  8:10 PM      Result Value Range   Hepatitis C Vrs RNA by PCR-Qual POSITIVE (*) Negative   Comment: (NOTE)     The lower limit of detection for Hepatitis C RNA by PCR is 15 IU/mL.     The performance characteristics of the Roche TaqMan HCV test were         determined by [facility]. It has not been cleared or     approved by the U.S. Food and Drug Administration (FDA). The FDA has     determined that such clearance or approval is not necessary. This test     is used for clinical purposes. It should not be regarded as     investigational or for research. This laboratory is certified under     the Clinical Laboratory Improvement Amendments of 1988 (CLIA-88) as     qualified to perform high complexity testing     Performed at [facility]    Physical Findings: AIMS: Facial and Oral Movements Muscles of Facial Expression: None, normal Lips and Perioral Area: None, normal Jaw: None, normal Tongue: None, normal,Extremity Movements Upper (arms, wrists, hands, fingers): None, normal Lower (legs, knees, ankles, toes): None, normal, Trunk Movements Neck, shoulders, hips: None, normal, Overall Severity Severity of abnormal movements (highest score from questions above): None, normal Incapacitation due to abnormal movements: None, normal Patient's awareness of abnormal movements (rate only patient's report): No Awareness, Dental Status Current problems with teeth and/or dentures?: No Does patient usually wear dentures?: No  CIWA:  CIWA-Ar Total: 4 COWS:     Treatment Plan Summary: Daily contact with patient to assess and evaluate symptoms and progress in treatment Medication management  Plan: Supportive approach/coping  skills/relapse prevention Completed Librium Detoxification treatment protocols Reinforced CBT ;mindfuless, stress management Optimize treatment with psychotropics, Initiated hydroxyzine 25 mg tid prn for anxiety. Continue current treatment plan.  Medical Decision Making Problem Points:  Review of psycho-social stressors (1) Data Points:  Review of medication regiment & side effects (2) Review of new medications or change in dosage (2)  I certify that inpatient services furnished can reasonably be expected to improve the patient's condition.   Armandina Stammer I, PMHNP, FNP-BC 09/01/2013, 3:16 PM

## 2013-09-01 NOTE — Progress Notes (Signed)
[facility] Group Notes:  (Nursing/MHT/Case Management/Adjunct)  Date:  09/01/2013  Time:  6:28 PM  Type of Therapy:  Psychoeducational Skills  Participation Level:  Active  Participation Quality:  Appropriate, Attentive and Supportive  Affect:  Appropriate  Cognitive:  Appropriate  Insight:  Appropriate  Engagement in Group:  Engaged and Supportive  Modes of Intervention:  Activity  Summary of Progress/Problems: Pt stated one coping skill he has learned is playing board games and talking with people.  [name] 09/01/2013, 6:28 PM

## 2013-09-01 NOTE — [facility] Group Notes (Signed)
Adult Psychoeducational Group Note  Date:  09/01/2013 Time:  9:30 am  Group Topic/Focus:  Self Inventory and Healthy Coping Skills Review  Participation Level:  Minimal  Participation Quality:  Attentive, Resistant and Supportive  Affect:  Depressed and Flat  Cognitive:  Alert and Appropriate  Insight: Improving  Engagement in Group:  Engaged, Resistant and Supportive  Modes of Intervention:  Clarification, Discussion, Education, Rapport Building and Support  Additional Comments:  Pt was initially resistant to share but with encouragement pt decided to share in group, states that he has been having "panic feeling but it is better today than yesterday.  My goal for today is to try not to freak out."  Alfonse Spruce 09/01/2013, 11:25 AM

## 2013-09-02 MED ORDER — HYDROXYZINE HCL 25 MG PO TABS
25.0000 mg | ORAL_TABLET | Freq: Four times a day (QID) | ORAL | Status: DC | PRN
  Administered 2013-09-02: 25 mg via ORAL
  Filled 2013-09-02 (×2): qty 1

## 2013-09-02 MED ORDER — HYDROXYZINE HCL 50 MG PO TABS
50.0000 mg | ORAL_TABLET | Freq: Four times a day (QID) | ORAL | Status: DC | PRN
  Administered 2013-09-02 – 2013-09-03 (×3): 50 mg via ORAL
  Filled 2013-09-02 (×3): qty 1
  Filled 2013-09-02: qty 30

## 2013-09-02 NOTE — Progress Notes (Signed)
D.  Pt continues to be anxious but pleasant on approach.  Denies other complaints.  States he is hopeful for medication adjustment to assist with continued anxiety.  Pt also states that he was told help could be given to get him some contact solution for his eye contacts lenses, given saline solution here.  Pt wishes to talk to Dr. Dub Mikes in the AM.  Denies SI/HI/hallucinations at this time.  Positive for evening AA group, interacting appropriately within milieu.  A.  Support and encouragement offered, NP called to increase Vistaril to 50 mg rather than 25 mg every 6 hours to help with Pt's anxiety.  R.  Pt pleased with change, remains safe on unit.  Will continue to monitor.

## 2013-09-02 NOTE — Progress Notes (Signed)
Patient did attend the evening speaker AA meeting.  

## 2013-09-02 NOTE — Progress Notes (Signed)
[facility] Group Notes:  (Nursing/MHT/Case Management/Adjunct)  Date:  09/01/2013 Time:  2100 Type of Therapy:  wrap up group  Participation Level:  Active  Participation Quality:  Appropriate, Attentive, Sharing and Supportive  Affect:  Appropriate  Cognitive:  Alert and Appropriate  Insight:  Appropriate  Engagement in Group:  Engaged  Modes of Intervention:  Clarification, Education and Support  Summary of Progress/Problems: Plans to go to [facility] after discharge. Pt reports feeling better overall but is experiencing some fogginess and shakiness. Pt not sure if it is related to withdrawal or medicine.   Shelah Lewandowsky 09/02/2013, 3:09 AM

## 2013-09-02 NOTE — Progress Notes (Signed)
Patient ID: [name], male   DOB: 12/26/1975, 37 y.o.   MRN: [id number] D. Pt denies SI HI AVH. Pt is pleasant cooperative and taking medications. Pt Pt is interacting appropriately in milieu. A. Support and encouragement offered. R. Pt remains safe of unit will continue to monitor.

## 2013-09-02 NOTE — [facility] Group Notes (Signed)
[facility] Group Notes:  (Clinical Social Work)  09/02/2013  10:00-11:00AM  Summary of Progress/Problems:   The main focus of today's process group was to identify the patient's current support system and explore what other supports can be put in place.  There was also an extensive discussion about what constitutes a healthy support versus an unhealthy support.  With some group members' miscomprehension about the role of counseling in recovery, a great deal of the time was spent on discussing expectations for self and others in treatment.  The patient stated the only current support in place is his aunt in [place], and he would like to eventually move there.  He is both excited and nervous about going to the [employer].  He was the main speaker during group and was able to express some anxiety and hope, as well as develop a plan as he spoke about how he is going to increase his local supports.  Type of Therapy:  Process Group with Motivational Interviewing  Participation Level:  Active  Participation Quality:  Attentive, Sharing and Supportive  Affect:  Blunted and anxious  Cognitive:  Appropriate and Oriented  Insight:  Engaged  Engagement in Therapy:  Engaged  Modes of Intervention:   Education, Support and Processing, Activity  [employer], LCSW 09/02/2013, 12:28 PM

## 2013-09-02 NOTE — Progress Notes (Signed)
Patient ID: [name], male   DOB: 11-17-1975, 37 y.o.   MRN: [id number] Patient ID: [name], male   DOB: 02-Dec-1975, 37 y.o.   MRN: [id number] [facility] MD Progress Note  09/02/2013 3:45 PM [name]  MRN:  [id number]  Subjective:  [name] says he was doing okay up until lunch time, when he developed shakes and tremors.He continues to report a lot of anxiety and withdrawal symptoms. He has been trying not to take Trazadone "makes me feel hungover".  He reports possible placement with [facility], as early as tomorrow. He does express concerns about his w/d symptoms and being in the [facility]. Would like to have something for anxiety since he completed the Librium detox. He is sleeping fair, reports 4-5 hours of sleep last night. Denies any SI,HI, AVH.   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  HENT: Negative.   Eyes: Negative.   Respiratory: Negative.   Cardiovascular: Negative.   Gastrointestinal: Negative.   Genitourinary: Negative.   Musculoskeletal: Negative.   Skin: Negative.   Neurological: Positive for tremors and weakness.  Endo/Heme/Allergies: Negative.   Psychiatric/Behavioral: Positive for depression and substance abuse. The patient is nervous/anxious and has insomnia.     Blood pressure 120/67, pulse 90, temperature 97.7 F  (36.5 C), temperature source Oral, resp. rate 18, height 6\' 3"  (1.905 m), weight 74.617 kg (164 lb 8 oz).Body mass index is 20.56 kg/(m^2).  General Appearance: Fairly Groomed  Patent attorney::  Fair  Speech:  Slow and Slurred  Volume:  Decreased  Mood:  Anxious and worried, overwhelmed  Affect:  anxious, worried  Thought Process:  Coherent and Goal Directed  Orientation:  Full (Time, Place, and Person)  Thought Content:  Rumination and worries, concerns  Suicidal Thoughts:  No  Homicidal Thoughts:  No  Memory:  Immediate;   Fair Recent;   Fair Remote;   Fair  Judgement:  Fair  Insight:  Present and superficial  Psychomotor Activity:  Restlessness  Concentration:  Fair  Recall:  Fair  Akathisia:  NA  Handed:    AIMS (if indicated):     Assets:  Desire for Improvement  Sleep:  Number of Hours: 6.25   Current Medications: Current Facility-Administered Medications  Medication Dose Route Frequency Provider Last Rate Last Dose  . acetaminophen (TYLENOL) tablet 650 mg  650 mg Oral Q6H PRN Shuvon Rankin, NP      . alum & mag hydroxide-simeth (MAALOX/MYLANTA) 200-200-20 MG/5ML suspension 30 mL  30 mL Oral Q4H PRN Shuvon Rankin, NP      . feeding supplement (ENSURE COMPLETE) (ENSURE COMPLETE) liquid 237 mL  237 mL Oral Q24H Jeoffrey Massed, RD   237 mL at 09/01/13 2106  . gabapentin (NEURONTIN) capsule 400 mg  400 mg Oral TID Rachael Fee, MD   400 mg at 09/02/13 1145  . hydrOXYzine (ATARAX/VISTARIL) tablet 25 mg  25 mg Oral Q6H PRN Nicole Kindred  I Nwoko, NP      . ibuprofen (ADVIL,MOTRIN) tablet 600 mg  600 mg Oral Q8H PRN Shuvon Rankin, NP   600 mg at 09/02/13 0836  . lamoTRIgine (LAMICTAL) tablet 100 mg  100 mg Oral BID Shuvon Rankin, NP   100 mg at 09/02/13 0735  . magnesium hydroxide (MILK OF MAGNESIA) suspension 30 mL  30 mL Oral Daily PRN Shuvon Rankin, NP      . multivitamin with minerals tablet 1 tablet  1 tablet Oral Daily Rachael Fee, MD   1 tablet at 09/02/13 0735  . nicotine  (NICODERM CQ - dosed in mg/24 hours) patch 21 mg  21 mg Transdermal Daily Shuvon Rankin, NP   21 mg at 09/02/13 0735  . ondansetron (ZOFRAN) tablet 4 mg  4 mg Oral Q8H PRN Shuvon Rankin, NP      . thiamine (VITAMIN B-1) tablet 100 mg  100 mg Oral Daily Rachael Fee, MD   100 mg at 09/02/13 0735  . traZODone (DESYREL) tablet 100 mg  100 mg Oral QHS PRN Shuvon Rankin, NP        Lab Results:  No results found for this or any previous visit (from the past 48 hour(s)).  Physical Findings: AIMS: Facial and Oral Movements Muscles of Facial Expression: None, normal Lips and Perioral Area: None, normal Jaw: None, normal Tongue: None, normal,Extremity Movements Upper (arms, wrists, hands, fingers): None, normal Lower (legs, knees, ankles, toes): None, normal, Trunk Movements Neck, shoulders, hips: None, normal, Overall Severity Severity of abnormal movements (highest score from questions above): None, normal Incapacitation due to abnormal movements: None, normal Patient's awareness of abnormal movements (rate only patient's report): No Awareness, Dental Status Current problems with teeth and/or dentures?: No Does patient usually wear dentures?: No  CIWA:  CIWA-Ar Total: 6 COWS:     Treatment Plan Summary: Daily contact with patient to assess and evaluate symptoms and progress in treatment Medication management  Plan: Supportive approach/coping skills/relapse prevention Completed Librium Detoxification treatment protocols Reinforced CBT ;mindfuless, stress management Optimize treatment with psychotropics, Initiated hydroxyzine 25 mg Q6 prn for anxiety. Continue current treatment plan.  Medical Decision Making Problem Points:  Review of psycho-social stressors (1) Data Points:  Review of medication regiment & side effects (2) Review of new medications or change in dosage (2)  I certify that inpatient services furnished can reasonably be expected to improve the patient's condition.    [name], PMHNP, FNP-BC 09/02/2013, 3:45 PM

## 2013-09-02 NOTE — Progress Notes (Signed)
Adult Psychoeducational Group Note  Date:  09/02/2013 Time:  10:38 AM  Group Topic/Focus:  Making Healthy Choices:   The focus of this group is to help patients identify negative/unhealthy choices they were using prior to admission and identify positive/healthier coping strategies to replace them upon discharge.  Participation Level:  Active  Participation Quality:  Appropriate and Attentive  Affect:  Appropriate  Cognitive:  Appropriate  Insight: Appropriate and Good  Engagement in Group:  Supportive  Modes of Intervention:  Discussion and Socialization  Additional Comments:  Patient showed insight into relapse prevention and how to utilize his support systems.  Nestor Ramp [facility] 09/02/2013, 10:38 AM

## 2013-09-02 NOTE — Progress Notes (Signed)
[facility] Group Notes:  (Nursing/MHT/Case Management/Adjunct)  Date:  09/02/2013  Time:  6:24 PM  Type of Therapy:  Psychoeducational Skills  Participation Level:  Active  Participation Quality:  Appropriate  Affect:  Appropriate  Cognitive:  Appropriate  Insight:  Appropriate  Engagement in Group:  Engaged  Modes of Intervention:  Activity  Summary of Progress/Problems: Pt stated one support he wanted to add was getting a Dance movement psychotherapist. Having someone who has been through the same things and is still working the program to stay sober.  Caswell Corwin 09/02/2013, 6:24 PM

## 2013-09-02 NOTE — Progress Notes (Signed)
Pt up at nurses station complaining of agitation and anxiety. Pt states he feels more tremulous and does not feel like his current medication regimen working. Pt is concerned about leaving tomorrow and feeling like this. Pt would like his medication reviewed. Pt complain of foot cramps as well.

## 2013-09-02 NOTE — [facility] Group Notes (Signed)
[facility] Group Notes:  (Nursing/MHT/Case Management/Adjunct)  Date:  09/02/2013  Time:  2:43 PM  Type of Therapy:  Psychoeducational Skills  Participation Level:  Active  Participation Quality:  Appropriate  Affect:  Appropriate  Cognitive:  Appropriate  Insight:  Appropriate  Engagement in Group:  Engaged  Modes of Intervention:  Discussion  Summary of Progress/Problems: Pt discussed coping skills for his increased anxiety. Pt was engaged .  Elisha Ponder 09/02/2013, 2:43 PM

## 2013-09-03 DIAGNOSIS — F1910 Other psychoactive substance abuse, uncomplicated: Secondary | ICD-10-CM

## 2013-09-03 DIAGNOSIS — F332 Major depressive disorder, recurrent severe without psychotic features: Secondary | ICD-10-CM

## 2013-09-03 DIAGNOSIS — F1994 Other psychoactive substance use, unspecified with psychoactive substance-induced mood disorder: Secondary | ICD-10-CM

## 2013-09-03 DIAGNOSIS — F1010 Alcohol abuse, uncomplicated: Secondary | ICD-10-CM

## 2013-09-03 MED ORDER — POLYVINYL ALCOHOL 1.4 % OP SOLN
1.0000 [drp] | OPHTHALMIC | Status: DC | PRN
  Filled 2013-09-03: qty 15

## 2013-09-03 MED ORDER — TRAZODONE HCL 100 MG PO TABS: 100.0000 mg | ORAL_TABLET | Freq: Every day | ORAL | Status: DC

## 2013-09-03 MED ORDER — HYDROXYZINE HCL 50 MG PO TABS: 50.0000 mg | ORAL_TABLET | Freq: Four times a day (QID) | ORAL | Status: DC | PRN

## 2013-09-03 MED ORDER — LAMOTRIGINE 100 MG PO TABS: 100.0000 mg | ORAL_TABLET | Freq: Two times a day (BID) | ORAL | Status: DC

## 2013-09-03 MED ORDER — POLYVINYL ALCOHOL 1.4 % OP SOLN: 1.0000 [drp] | OPHTHALMIC | Status: DC | PRN

## 2013-09-03 MED ORDER — GABAPENTIN 400 MG PO CAPS: 400.0000 mg | ORAL_CAPSULE | Freq: Three times a day (TID) | ORAL | Status: DC

## 2013-09-03 NOTE — Progress Notes (Signed)
[facility] Adult Case Management Discharge Plan :  Will you be returning to the same living situation after discharge: No. [facility] At discharge, do you have transportation home?:Yes,  [facility] will transport pt Do you have the ability to pay for your medications:Yes,  Medicaid  Release of information consent forms completed and in the chart;  Patient's signature needed at discharge.  Patient to Follow up at: Follow-up Information   Follow up with [facility]. (Walk in between 8am-9am Monday through Friday for hospital followup/medication management/assessment for therapy services. )    Contact information:   [address] [place], [place] [address] Phone: [phone] Fax: [phone]      Patient denies SI/HI:   Yes,  during group/self report    Safety Planning and Suicide Prevention discussed:  Yes,  SPE completed with pt as he refused to consent to family contact. SPI pamphlet provided to pt and he was encouraged to share information with support network.   Smart, Makenly Larabee LCSWA  09/03/2013, 10:31 AM

## 2013-09-03 NOTE — [facility] Suicide Risk Assessment (Signed)
Suicide Risk Assessment  Discharge Assessment     Demographic Factors:  Male and Caucasian  Mental Status Per Nursing Assessment::   On Admission:  Self-harm thoughts  Current Mental Status by Physician: In full contact with reality. There are no suicidal ideas, plans or intent. His mood is anxious, worried, affect is anxious. States he is anxious about going to the [facility] but states he thinks it is going to work well for him. He states he has had anxiety for a very long time. Does not want to drink or take Klonopin. He admits he was overtaking it. Wants to pursue long term sobriety   Loss Factors: Loss of significant relationship  Historical Factors: NA  Risk Reduction Factors:   Sense of responsibility to family  Continued Clinical Symptoms:  Panic Attacks Depression:   Comorbid alcohol abuse/dependence Alcohol/Substance Abuse/Dependencies  Cognitive Features That Contribute To Risk:  Polarized thinking Thought constriction (tunnel vision)    Suicide Risk:  Minimal: No identifiable suicidal ideation.  Patients presenting with no risk factors but with morbid ruminations; may be classified as minimal risk based on the severity of the depressive symptoms  Discharge Diagnoses:   AXIS I:  Alcohol Dependence, GAD, Panic attacks, MDD AXIS II:  Deferred AXIS III:   Past Medical History  Diagnosis Date  . Seizures   . Bipolar 1 disorder    AXIS IV:  other psychosocial or environmental problems AXIS V:  51-60 moderate symptoms  Plan Of Care/Follow-up recommendations:  Activity:  as tolerated Diet:  regular Follow up outpatient at [facility] Is patient on multiple antipsychotic therapies at discharge:  No   Has Patient had three or more failed trials of antipsychotic monotherapy by history:  No  Recommended Plan for Multiple Antipsychotic Therapies: NA  Lindee Leason A 09/03/2013, 12:02 PM

## 2013-09-03 NOTE — [facility] Group Notes (Signed)
[facility] LCSW Aftercare Discharge Planning Group Note   09/03/2013 9:21 AM  Participation Quality:  Appropriate   Mood/Affect:  Appropriate  Depression Rating:  0  Anxiety Rating:  7  Thoughts of Suicide:  No Will you contract for safety?   NA  Current AVH:  NA  Plan for Discharge/Comments:  Pt reports that he is supposed to d/c today to [facility]. "they will come to pick me up around 1PM." Pt to follow up at [facility] for med management.   Transportation Means: [facility] employee   Supports: none identified   Smart, Oncologist
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
## 2013-09-03 NOTE — Tx Team (Signed)
Interdisciplinary Treatment Plan Update (Adult)  Date: 09/03/2013   Time Reviewed: 10:27 AM  Progress in Treatment:  Attending groups: Yes  Participating in groups:  Yes   Taking medication as prescribed: Yes  Tolerating medication: Yes  Family/Significant othe contact made: No. SPE required for this pt.  Patient understands diagnosis: Yes, AEB seeking treatment for Si with plan, ETOH detox, and depression/mood stabilization.  Discussing patient identified problems/goals with staff: Yes  Medical problems stabilized or resolved: Yes  Denies suicidal/homicidal ideation: Yes, in group/self report.  Patient has not harmed self or Others: Yes  New problem(s) identified:  Discharge Plan or Barriers: Pt accepted into [facility] and plans to follow up at [facility] for med management. Someone from [facility] house to transport pt today after lunch.  Additional comments:   Reason for Continuation of Hospitalization: d/c today   Estimated length of stay: d/c today   For review of initial/current patient goals, please see plan of care.  Attendees:  Patient:    Family:    Physician: Geoffery Lyons MD 09/03/2013 10:27 AM   Nursing: Lupita Leash RN 09/03/2013 10:27 AM   Clinical Social Worker Tom Macpherson Smart, LCSWA  09/03/2013 10:27 AM   Other: Darden Dates Nurse CM 09/03/2013 10:27 AM   Other:    Other:   Other:    Scribe for Treatment Team:  Trula Slade LCSWA  09/03/2013 10:27 AM

## 2013-09-03 NOTE — Progress Notes (Signed)
Adult Psychoeducational Group Note  Date:  09/03/2013 Time:  10:00AM Group Topic/Focus:  Therapuetic Activity  Participation Level:  Active  Participation Quality:  Appropriate and Attentive  Affect:  Appropriate  Cognitive:  Alert and Appropriate  Insight: Appropriate  Engagement in Group:  Engaged  Modes of Intervention:  movie  Additional Comments:  Pt was able to watch movie The sober life: My behavior and complete a word search on Mental/Emotional words.   Bing Plume D 09/03/2013, 10:05 AM

## 2013-09-03 NOTE — Progress Notes (Signed)
Patient ID: [name], male   DOB: [birth year], 37 y.o.   MRN: [id number] He has been discharged and is to be follower up by [facility]. He voiced understanding of discharge instruction and of follow up plan. He denies thoughts of harming self and all his belonging were taken home with him. Education was done about how to use his eye drops. And instructed that drops were  Not for his contacts.

## 2013-09-06 NOTE — Progress Notes (Signed)
Patient Discharge Instructions:  After Visit Summary (AVS):   Faxed to:  09/06/13 Discharge Summary Note:   Faxed to:  09/06/13 Psychiatric Admission Assessment Note:   Faxed to:  09/06/13 Suicide Risk Assessment - Discharge Assessment:   Faxed to:  09/06/13 Faxed/Sent to the Next Level Care provider:  09/06/13 Faxed to [facility] [place] @ [phone]  Jerelene Redden, 09/06/2013, 4:08 PM

## 2013-09-10 ENCOUNTER — Encounter (HOSPITAL_COMMUNITY): Payer: Self-pay | Admitting: Emergency Medicine

## 2013-09-10 ENCOUNTER — Emergency Department (HOSPITAL_COMMUNITY)
Admission: EM | Admit: 2013-09-10 | Discharge: 2013-09-10 | Disposition: A | Discharge status: Home / Self Care | Payer: Self-pay | Attending: Emergency Medicine | Admitting: Emergency Medicine

## 2013-09-10 DIAGNOSIS — M549 Dorsalgia, unspecified: Secondary | ICD-10-CM | POA: Insufficient documentation

## 2013-09-10 DIAGNOSIS — R569 Unspecified convulsions: Secondary | ICD-10-CM

## 2013-09-10 DIAGNOSIS — Z79899 Other long term (current) drug therapy: Secondary | ICD-10-CM | POA: Insufficient documentation

## 2013-09-10 DIAGNOSIS — K759 Inflammatory liver disease, unspecified: Secondary | ICD-10-CM | POA: Insufficient documentation

## 2013-09-10 DIAGNOSIS — F29 Unspecified psychosis not due to a substance or known physiological condition: Secondary | ICD-10-CM | POA: Insufficient documentation

## 2013-09-10 DIAGNOSIS — R42 Dizziness and giddiness: Secondary | ICD-10-CM | POA: Insufficient documentation

## 2013-09-10 DIAGNOSIS — G40909 Epilepsy, unspecified, not intractable, without status epilepticus: Secondary | ICD-10-CM | POA: Insufficient documentation

## 2013-09-10 DIAGNOSIS — IMO0001 Reserved for inherently not codable concepts without codable children: Secondary | ICD-10-CM | POA: Insufficient documentation

## 2013-09-10 DIAGNOSIS — F319 Bipolar disorder, unspecified: Secondary | ICD-10-CM | POA: Insufficient documentation

## 2013-09-10 DIAGNOSIS — M2550 Pain in unspecified joint: Secondary | ICD-10-CM | POA: Insufficient documentation

## 2013-09-10 DIAGNOSIS — F411 Generalized anxiety disorder: Secondary | ICD-10-CM | POA: Insufficient documentation

## 2013-09-10 DIAGNOSIS — M542 Cervicalgia: Secondary | ICD-10-CM | POA: Insufficient documentation

## 2013-09-10 DIAGNOSIS — R209 Unspecified disturbances of skin sensation: Secondary | ICD-10-CM | POA: Insufficient documentation

## 2013-09-10 DIAGNOSIS — Z87891 Personal history of nicotine dependence: Secondary | ICD-10-CM | POA: Insufficient documentation

## 2013-09-10 DIAGNOSIS — J3489 Other specified disorders of nose and nasal sinuses: Secondary | ICD-10-CM | POA: Insufficient documentation

## 2013-09-10 DIAGNOSIS — R110 Nausea: Secondary | ICD-10-CM | POA: Insufficient documentation

## 2013-09-10 HISTORY — DX: Anxiety disorder, unspecified: F41.9

## 2013-09-10 LAB — COMPREHENSIVE METABOLIC PANEL
ALT: 259 U/L — ABNORMAL HIGH (ref 0–53)
AST: 108 U/L — ABNORMAL HIGH (ref 0–37)
CO2: 27 mEq/L (ref 19–32)
Chloride: 104 mEq/L (ref 96–112)
GFR calc non Af Amer: >90 mL/min (ref >90)
Glucose, Bld: 101 mg/dL — ABNORMAL HIGH (ref 70–99)
Sodium: 139 mEq/L (ref 135–145)
Total Bilirubin: 0.7 mg/dL (ref 0.3–1.2)

## 2013-09-10 LAB — RAPID URINE DRUG SCREEN, HOSP PERFORMED
Amphetamines: NOT DETECTED
Barbiturates: NOT DETECTED
Benzodiazepines: NOT DETECTED
Opiates: NOT DETECTED
Tetrahydrocannabinol: NOT DETECTED

## 2013-09-10 LAB — CBC WITH DIFFERENTIAL/PLATELET
Basophils Absolute: 0 10*3/uL (ref 0.0–0.1)
Eosinophils Absolute: 0.2 10*3/uL (ref 0.0–0.7)
HCT: 42.4 % (ref 39.0–52.0)
Hemoglobin: 14.9 g/dL (ref 13.0–17.0)
Lymphocytes Relative: 17 % (ref 12–46)
Lymphs Abs: 1.7 10*3/uL (ref 0.7–4.0)
MCH: 32.3 pg (ref 26.0–34.0)
Monocytes Absolute: 0.8 10*3/uL (ref 0.1–1.0)
Neutro Abs: 7 10*3/uL (ref 1.7–7.7)
Platelets: 181 10*3/uL (ref 150–400)
RBC: 4.62 MIL/uL (ref 4.22–5.81)
RDW: 13.4 % (ref 11.5–15.5)
WBC: 9.7 10*3/uL (ref 4.0–10.5)

## 2013-09-10 LAB — URINALYSIS, ROUTINE W REFLEX MICROSCOPIC
Bilirubin Urine: NEGATIVE
Glucose, UA: NEGATIVE mg/dL
Hgb urine dipstick: NEGATIVE
Ketones, ur: NEGATIVE mg/dL
Protein, ur: NEGATIVE mg/dL
Urobilinogen, UA: 0.2 mg/dL (ref 0.0–1.0)

## 2013-09-10 MED ORDER — IBUPROFEN 400 MG PO TABS
400.0000 mg | ORAL_TABLET | Freq: Once | ORAL | Status: AC
  Administered 2013-09-10: 400 mg via ORAL
  Filled 2013-09-10: qty 1

## 2013-09-10 MED ORDER — LORAZEPAM 2 MG/ML IJ SOLN
0.5000 mg | Freq: Once | INTRAMUSCULAR | Status: AC
  Administered 2013-09-10: 0.5 mg via INTRAVENOUS
  Filled 2013-09-10: qty 1

## 2013-09-10 NOTE — ED Provider Notes (Signed)
Medical screening examination/treatment/procedure(s) were performed by non-physician practitioner and as supervising physician I was immediately available for consultation/collaboration.  EKG Interpretation   None         Layla Maw Jaylene Schrom, DO 09/10/13 1701

## 2013-09-10 NOTE — ED Notes (Addendum)
Pt reporting that he is "feeling weird like he is going to have another seizure". PA made aware. Seizure pads on bed, pt on monitor.
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
## 2013-09-10 NOTE — ED Notes (Signed)
Pt incontinent of urine. Clothes wet, changed and warm blankets applied.

## 2013-09-10 NOTE — ED Notes (Signed)
Pt has abrasion to right knee and tenderness noted on palpation of upper spine.

## 2013-09-16 ENCOUNTER — Emergency Department (HOSPITAL_COMMUNITY)
Admission: EM | Admit: 2013-09-16 | Discharge: 2013-09-16 | Disposition: A | Discharge status: Home / Self Care | Payer: Self-pay | Attending: Emergency Medicine | Admitting: Emergency Medicine

## 2013-09-16 ENCOUNTER — Encounter (HOSPITAL_COMMUNITY): Payer: Self-pay | Admitting: Emergency Medicine

## 2013-09-16 DIAGNOSIS — F411 Generalized anxiety disorder: Secondary | ICD-10-CM | POA: Insufficient documentation

## 2013-09-16 DIAGNOSIS — F319 Bipolar disorder, unspecified: Secondary | ICD-10-CM | POA: Insufficient documentation

## 2013-09-16 DIAGNOSIS — Z79899 Other long term (current) drug therapy: Secondary | ICD-10-CM | POA: Insufficient documentation

## 2013-09-16 DIAGNOSIS — R413 Other amnesia: Secondary | ICD-10-CM | POA: Insufficient documentation

## 2013-09-16 DIAGNOSIS — Z87891 Personal history of nicotine dependence: Secondary | ICD-10-CM | POA: Insufficient documentation

## 2013-09-16 DIAGNOSIS — R569 Unspecified convulsions: Secondary | ICD-10-CM | POA: Insufficient documentation

## 2013-09-16 DIAGNOSIS — R259 Unspecified abnormal involuntary movements: Secondary | ICD-10-CM | POA: Insufficient documentation

## 2013-09-16 DIAGNOSIS — F05 Delirium due to known physiological condition: Secondary | ICD-10-CM | POA: Insufficient documentation

## 2013-09-16 DIAGNOSIS — Z888 Allergy status to other drugs, medicaments and biological substances status: Secondary | ICD-10-CM | POA: Insufficient documentation

## 2013-09-16 LAB — CBC WITH DIFFERENTIAL/PLATELET
Basophils Absolute: 0 10*3/uL (ref 0.0–0.1)
Eosinophils Relative: 5 % (ref 0–5)
HCT: 44.2 % (ref 39.0–52.0)
Lymphocytes Relative: 27 % (ref 12–46)
MCH: 33.3 pg (ref 26.0–34.0)
MCHC: 36 g/dL (ref 30.0–36.0)
MCV: 92.5 fL (ref 78.0–100.0)
Neutro Abs: 4.2 10*3/uL (ref 1.7–7.7)
Platelets: 185 10*3/uL (ref 150–400)
RDW: 13 % (ref 11.5–15.5)
WBC: 7.2 10*3/uL (ref 4.0–10.5)

## 2013-09-16 LAB — COMPREHENSIVE METABOLIC PANEL
ALT: 380 U/L — ABNORMAL HIGH (ref 0–53)
AST: 128 U/L — ABNORMAL HIGH (ref 0–37)
CO2: 29 mEq/L (ref 19–32)
Calcium: 9.1 mg/dL (ref 8.4–10.5)
GFR calc non Af Amer: >90 mL/min (ref >90)
Sodium: 141 mEq/L (ref 135–145)
Total Bilirubin: 0.6 mg/dL (ref 0.3–1.2)
Total Protein: 6.7 g/dL (ref 6.0–8.3)

## 2013-09-16 MED ORDER — LORAZEPAM 1 MG PO TABS
1.0000 mg | ORAL_TABLET | Freq: Once | ORAL | Status: AC
  Administered 2013-09-16: 1 mg via ORAL
  Filled 2013-09-16: qty 1

## 2013-09-16 MED ORDER — LORAZEPAM 1 MG PO TABS: 1.0000 mg | ORAL_TABLET | Freq: Three times a day (TID) | ORAL | Status: DC | PRN

## 2013-09-16 NOTE — ED Notes (Signed)
Patient states he had a seizure last night and has a history of seizures.

## 2013-09-16 NOTE — ED Notes (Signed)
PT comfortable with d/c and f/u instructions. Pt comfortable with driving restriction instructions. Prescriptions x1
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
## 2013-09-18 LAB — LAMOTRIGINE LEVEL: Lamotrigine Lvl: 3 ug/mL (ref 3.0–14.0)

## 2013-09-18 NOTE — ED Provider Notes (Signed)
Medical screening examination/treatment/procedure(s) were performed by non-physician practitioner and as supervising physician I was immediately available for consultation/collaboration.  EKG Interpretation   None        Juliet Rude. Rubin Payor, MD 09/18/13 [phone]

## 2013-09-19 LAB — TRAZODONE (DESYREL), BLOOD: Trazodone Lvl: 96 ng/mL — ABNORMAL LOW (ref 800–1600)

## 2013-12-20 ENCOUNTER — Encounter (HOSPITAL_COMMUNITY): Payer: Self-pay | Admitting: Emergency Medicine

## 2013-12-20 DIAGNOSIS — G40909 Epilepsy, unspecified, not intractable, without status epilepticus: Secondary | ICD-10-CM | POA: Insufficient documentation

## 2013-12-20 DIAGNOSIS — F411 Generalized anxiety disorder: Secondary | ICD-10-CM | POA: Insufficient documentation

## 2013-12-20 DIAGNOSIS — M545 Low back pain, unspecified: Secondary | ICD-10-CM | POA: Insufficient documentation

## 2013-12-20 DIAGNOSIS — Z79899 Other long term (current) drug therapy: Secondary | ICD-10-CM | POA: Insufficient documentation

## 2013-12-20 DIAGNOSIS — F17200 Nicotine dependence, unspecified, uncomplicated: Secondary | ICD-10-CM | POA: Insufficient documentation

## 2013-12-20 DIAGNOSIS — F319 Bipolar disorder, unspecified: Secondary | ICD-10-CM | POA: Insufficient documentation

## 2013-12-20 NOTE — ED Notes (Signed)
Pt reports hx of lower back pain that has gotten worse over the 2 days. Pt states that he was lifting and turned the wrong way and felt his back pop. Pt alert and orient and ambulatory.

## 2013-12-21 ENCOUNTER — Emergency Department (HOSPITAL_COMMUNITY)
Admission: EM | Admit: 2013-12-21 | Discharge: 2013-12-21 | Disposition: A | Discharge status: Home / Self Care | Payer: Self-pay | Attending: Emergency Medicine | Admitting: Emergency Medicine

## 2013-12-21 DIAGNOSIS — M545 Low back pain, unspecified: Secondary | ICD-10-CM

## 2013-12-21 MED ORDER — IBUPROFEN 800 MG PO TABS: 800.0000 mg | ORAL_TABLET | Freq: Three times a day (TID) | ORAL | Status: DC

## 2013-12-21 MED ORDER — KETOROLAC TROMETHAMINE 60 MG/2ML IM SOLN
60.0000 mg | Freq: Once | INTRAMUSCULAR | Status: AC
  Administered 2013-12-21: 60 mg via INTRAMUSCULAR
  Filled 2013-12-21: qty 2

## 2013-12-21 MED ORDER — OXYCODONE-ACETAMINOPHEN 5-325 MG PO TABS
2.0000 | ORAL_TABLET | Freq: Once | ORAL | Status: AC
  Administered 2013-12-21: 2 via ORAL
  Filled 2013-12-21: qty 2

## 2013-12-21 MED ORDER — HYDROCODONE-ACETAMINOPHEN 5-325 MG PO TABS: 1.0000 | ORAL_TABLET | ORAL | Status: DC | PRN

## 2013-12-21 NOTE — ED Provider Notes (Signed)
Medical screening examination/treatment/procedure(s) were performed by non-physician practitioner and as supervising physician I was immediately available for consultation/collaboration.   EKG Interpretation None       Bode Pieper, MD 12/21/13 0803 
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
## 2013-12-21 NOTE — Discharge Instructions (Signed)
Take vicodin as prescribed for severe pain.  Do not drive within four hours of taking this medication (may cause drowsiness or confusion).   Take ibuprofen as well; up to 800mg  three times a day with food.   Ice or apply heating pad 2-3 times a day for 15-20 minutes.  Avoid activities that aggravate pain.   Follow up with the neurosurgeon if your pain has not started to improve in 7 days. You should return to the ER if you develop change in or worsening of pain, fever (100.5 degrees or greater), inability to walk due to leg weakness or loss of control of bladder/bowels.

## 2014-06-14 ENCOUNTER — Encounter (HOSPITAL_COMMUNITY): Payer: Self-pay | Admitting: Emergency Medicine

## 2014-06-14 ENCOUNTER — Emergency Department (HOSPITAL_COMMUNITY)
Admission: EM | Admit: 2014-06-14 | Discharge: 2014-06-15 | Disposition: A | Discharge status: Other Acute Inpatient Hospital | Payer: Self-pay | Attending: Emergency Medicine | Admitting: Emergency Medicine

## 2014-06-14 DIAGNOSIS — F17200 Nicotine dependence, unspecified, uncomplicated: Secondary | ICD-10-CM | POA: Insufficient documentation

## 2014-06-14 DIAGNOSIS — G40909 Epilepsy, unspecified, not intractable, without status epilepticus: Secondary | ICD-10-CM | POA: Insufficient documentation

## 2014-06-14 DIAGNOSIS — F1994 Other psychoactive substance use, unspecified with psychoactive substance-induced mood disorder: Secondary | ICD-10-CM | POA: Diagnosis present

## 2014-06-14 DIAGNOSIS — F411 Generalized anxiety disorder: Secondary | ICD-10-CM | POA: Insufficient documentation

## 2014-06-14 DIAGNOSIS — R45851 Suicidal ideations: Secondary | ICD-10-CM

## 2014-06-14 DIAGNOSIS — F1010 Alcohol abuse, uncomplicated: Secondary | ICD-10-CM

## 2014-06-14 DIAGNOSIS — F319 Bipolar disorder, unspecified: Secondary | ICD-10-CM | POA: Insufficient documentation

## 2014-06-14 DIAGNOSIS — F1020 Alcohol dependence, uncomplicated: Secondary | ICD-10-CM | POA: Diagnosis present

## 2014-06-14 DIAGNOSIS — F10230 Alcohol dependence with withdrawal, uncomplicated: Secondary | ICD-10-CM

## 2014-06-14 DIAGNOSIS — Z79899 Other long term (current) drug therapy: Secondary | ICD-10-CM | POA: Insufficient documentation

## 2014-06-14 NOTE — ED Notes (Signed)
Pt presents with request for detox from etoh, pt states he lives in halfway house and relapsed today, consumed 4-5 bootleggers. Pt also endorses SI with no plan.

## 2014-06-15 ENCOUNTER — Inpatient Hospital Stay (HOSPITAL_COMMUNITY)
Admission: AD | Admit: 2014-06-15 | Discharge: 2014-06-21 | DRG: 897 | Disposition: A | Discharge status: Home / Self Care | Payer: Federal, State, Local not specified - Other | Source: Intra-hospital | Attending: Psychiatry | Admitting: Psychiatry

## 2014-06-15 ENCOUNTER — Encounter (HOSPITAL_COMMUNITY): Payer: Self-pay | Admitting: Emergency Medicine

## 2014-06-15 ENCOUNTER — Encounter (HOSPITAL_COMMUNITY): Payer: Self-pay | Admitting: Psychiatry

## 2014-06-15 DIAGNOSIS — F410 Panic disorder [episodic paroxysmal anxiety] without agoraphobia: Secondary | ICD-10-CM

## 2014-06-15 DIAGNOSIS — F1020 Alcohol dependence, uncomplicated: Principal | ICD-10-CM | POA: Diagnosis present

## 2014-06-15 DIAGNOSIS — F1994 Other psychoactive substance use, unspecified with psychoactive substance-induced mood disorder: Secondary | ICD-10-CM | POA: Diagnosis present

## 2014-06-15 DIAGNOSIS — F314 Bipolar disorder, current episode depressed, severe, without psychotic features: Secondary | ICD-10-CM | POA: Diagnosis present

## 2014-06-15 DIAGNOSIS — F17200 Nicotine dependence, unspecified, uncomplicated: Secondary | ICD-10-CM | POA: Diagnosis present

## 2014-06-15 DIAGNOSIS — R45851 Suicidal ideations: Secondary | ICD-10-CM

## 2014-06-15 DIAGNOSIS — F411 Generalized anxiety disorder: Secondary | ICD-10-CM | POA: Diagnosis present

## 2014-06-15 DIAGNOSIS — IMO0002 Reserved for concepts with insufficient information to code with codable children: Secondary | ICD-10-CM

## 2014-06-15 DIAGNOSIS — F1010 Alcohol abuse, uncomplicated: Secondary | ICD-10-CM

## 2014-06-15 DIAGNOSIS — R748 Abnormal levels of other serum enzymes: Secondary | ICD-10-CM | POA: Diagnosis present

## 2014-06-15 LAB — COMPREHENSIVE METABOLIC PANEL
ALK PHOS: 55 U/L (ref 39–117)
ALT: 155 U/L — AB (ref 0–53)
AST: 57 U/L — ABNORMAL HIGH (ref 0–37)
Albumin: 4 g/dL (ref 3.5–5.2)
Anion gap: 15 (ref 5–15)
BUN: 12 mg/dL (ref 6–23)
CO2: 22 meq/L (ref 19–32)
Calcium: 8.7 mg/dL (ref 8.4–10.5)
Chloride: 102 mEq/L (ref 96–112)
Creatinine, Ser: 0.73 mg/dL (ref 0.50–1.35)
GLUCOSE: 87 mg/dL (ref 70–99)
POTASSIUM: 3.9 meq/L (ref 3.7–5.3)
Sodium: 139 mEq/L (ref 137–147)
Total Bilirubin: 0.4 mg/dL (ref 0.3–1.2)
Total Protein: 7.3 g/dL (ref 6.0–8.3)

## 2014-06-15 LAB — RAPID URINE DRUG SCREEN, HOSP PERFORMED
AMPHETAMINES: NOT DETECTED
BENZODIAZEPINES: NOT DETECTED
Barbiturates: NOT DETECTED
Cocaine: NOT DETECTED
OPIATES: NOT DETECTED
Tetrahydrocannabinol: NOT DETECTED

## 2014-06-15 LAB — CBC
HCT: 43.8 % (ref 39.0–52.0)
HEMOGLOBIN: 15.6 g/dL (ref 13.0–17.0)
MCH: 32.6 pg (ref 26.0–34.0)
MCHC: 35.6 g/dL (ref 30.0–36.0)
MCV: 91.4 fL (ref 78.0–100.0)
Platelets: 189 10*3/uL (ref 150–400)
RBC: 4.79 MIL/uL (ref 4.22–5.81)
RDW: 12.6 % (ref 11.5–15.5)
WBC: 9.2 10*3/uL (ref 4.0–10.5)

## 2014-06-15 LAB — ACETAMINOPHEN LEVEL

## 2014-06-15 LAB — SALICYLATE LEVEL: Salicylate Lvl: <2 mg/dL — ABNORMAL LOW (ref 2.8–20.0)

## 2014-06-15 LAB — ETHANOL: Alcohol, Ethyl (B): 119 mg/dL — ABNORMAL HIGH (ref 0–11)

## 2014-06-15 MED ORDER — LORAZEPAM 1 MG PO TABS: 0.0000 mg | ORAL_TABLET | Freq: Two times a day (BID) | ORAL | Status: DC

## 2014-06-15 MED ORDER — LORAZEPAM 1 MG PO TABS
1.0000 mg | ORAL_TABLET | Freq: Three times a day (TID) | ORAL | Status: DC | PRN
  Administered 2014-06-15: 1 mg via ORAL
  Filled 2014-06-15: qty 1

## 2014-06-15 MED ORDER — THIAMINE HCL 100 MG/ML IJ SOLN: 100.0000 mg | Freq: Every day | INTRAMUSCULAR | Status: DC

## 2014-06-15 MED ORDER — VITAMIN B-1 100 MG PO TABS
100.0000 mg | ORAL_TABLET | Freq: Every day | ORAL | Status: DC
  Administered 2014-06-16 – 2014-06-21 (×6): 100 mg via ORAL
  Filled 2014-06-15 (×10): qty 1

## 2014-06-15 MED ORDER — ACETAMINOPHEN 325 MG PO TABS
650.0000 mg | ORAL_TABLET | ORAL | Status: DC | PRN
  Administered 2014-06-16: 650 mg via ORAL
  Filled 2014-06-15: qty 2

## 2014-06-15 MED ORDER — LORAZEPAM 1 MG PO TABS: 1.0000 mg | ORAL_TABLET | Freq: Three times a day (TID) | ORAL | Status: DC | PRN

## 2014-06-15 MED ORDER — ZOLPIDEM TARTRATE 5 MG PO TABS
5.0000 mg | ORAL_TABLET | Freq: Every evening | ORAL | Status: DC | PRN
  Administered 2014-06-15: 5 mg via ORAL
  Filled 2014-06-15: qty 1

## 2014-06-15 MED ORDER — ZOLPIDEM TARTRATE 5 MG PO TABS
5.0000 mg | ORAL_TABLET | Freq: Every evening | ORAL | Status: DC | PRN
Start: 2014-06-15 — End: 2014-06-17

## 2014-06-15 MED ORDER — ALUM & MAG HYDROXIDE-SIMETH 200-200-20 MG/5ML PO SUSP: 30.0000 mL | ORAL | Status: DC | PRN

## 2014-06-15 MED ORDER — ACETAMINOPHEN 325 MG PO TABS
650.0000 mg | ORAL_TABLET | ORAL | Status: DC | PRN
  Administered 2014-06-15: 650 mg via ORAL
  Filled 2014-06-15: qty 2

## 2014-06-15 MED ORDER — HYDROXYZINE HCL 25 MG PO TABS
25.0000 mg | ORAL_TABLET | Freq: Four times a day (QID) | ORAL | Status: DC | PRN
  Administered 2014-06-15: 25 mg via ORAL
  Filled 2014-06-15: qty 1

## 2014-06-15 MED ORDER — IBUPROFEN 800 MG PO TABS: 800.0000 mg | ORAL_TABLET | Freq: Four times a day (QID) | ORAL | Status: DC | PRN

## 2014-06-15 MED ORDER — GABAPENTIN 300 MG PO CAPS
300.0000 mg | ORAL_CAPSULE | Freq: Three times a day (TID) | ORAL | Status: DC
  Administered 2014-06-15 (×3): 300 mg via ORAL
  Filled 2014-06-15 (×3): qty 1

## 2014-06-15 MED ORDER — ONDANSETRON HCL 4 MG PO TABS
4.0000 mg | ORAL_TABLET | Freq: Three times a day (TID) | ORAL | Status: DC | PRN
  Administered 2014-06-15: 4 mg via ORAL
  Filled 2014-06-15: qty 1

## 2014-06-15 MED ORDER — HYDROXYZINE HCL 25 MG PO TABS
25.0000 mg | ORAL_TABLET | Freq: Four times a day (QID) | ORAL | Status: DC | PRN
  Administered 2014-06-16: 25 mg via ORAL
  Filled 2014-06-15 (×2): qty 1

## 2014-06-15 MED ORDER — LORAZEPAM 1 MG PO TABS
0.0000 mg | ORAL_TABLET | Freq: Four times a day (QID) | ORAL | Status: DC
Start: 2014-06-16 — End: 2014-06-17
  Administered 2014-06-16: 1 mg via ORAL
  Administered 2014-06-16: 2 mg via ORAL
  Administered 2014-06-16: 1 mg via ORAL
  Administered 2014-06-17: 2 mg via ORAL
  Administered 2014-06-17: 1 mg via ORAL
  Filled 2014-06-15 (×2): qty 2
  Filled 2014-06-15 (×2): qty 1
  Filled 2014-06-15: qty 2

## 2014-06-15 MED ORDER — ONDANSETRON HCL 4 MG PO TABS
4.0000 mg | ORAL_TABLET | Freq: Three times a day (TID) | ORAL | Status: DC | PRN
  Administered 2014-06-17: 4 mg via ORAL
  Filled 2014-06-15: qty 1

## 2014-06-15 MED ORDER — LORAZEPAM 1 MG PO TABS: 0.0000 mg | ORAL_TABLET | Freq: Four times a day (QID) | ORAL | Status: DC

## 2014-06-15 MED ORDER — IBUPROFEN 800 MG PO TABS
800.0000 mg | ORAL_TABLET | Freq: Four times a day (QID) | ORAL | Status: DC | PRN
  Administered 2014-06-16 – 2014-06-21 (×6): 800 mg via ORAL
  Filled 2014-06-15 (×7): qty 1
  Filled 2014-06-15: qty 42

## 2014-06-15 MED ORDER — NICOTINE 21 MG/24HR TD PT24
21.0000 mg | MEDICATED_PATCH | Freq: Every day | TRANSDERMAL | Status: DC
  Administered 2014-06-15: 21 mg via TRANSDERMAL
  Filled 2014-06-15: qty 1

## 2014-06-15 MED ORDER — NICOTINE 21 MG/24HR TD PT24
21.0000 mg | MEDICATED_PATCH | Freq: Every day | TRANSDERMAL | Status: DC
  Administered 2014-06-16 – 2014-06-21 (×6): 21 mg via TRANSDERMAL
  Filled 2014-06-15 (×11): qty 1

## 2014-06-15 MED ORDER — GABAPENTIN 300 MG PO CAPS
300.0000 mg | ORAL_CAPSULE | Freq: Three times a day (TID) | ORAL | Status: DC
  Administered 2014-06-16 – 2014-06-21 (×17): 300 mg via ORAL
  Filled 2014-06-15 (×9): qty 1
  Filled 2014-06-15: qty 42
  Filled 2014-06-15 (×7): qty 1
  Filled 2014-06-15: qty 42
  Filled 2014-06-15 (×4): qty 1
  Filled 2014-06-15: qty 42
  Filled 2014-06-15 (×5): qty 1

## 2014-06-15 MED ORDER — VITAMIN B-1 100 MG PO TABS
100.0000 mg | ORAL_TABLET | Freq: Every day | ORAL | Status: DC
  Administered 2014-06-15: 100 mg via ORAL
  Filled 2014-06-15 (×2): qty 1

## 2014-06-15 NOTE — Progress Notes (Signed)
Received phone call back from [facility], per Jasmine December pt does not meet criteria for detox d/t only having relapsed for one day but would meet criteria for treatment but only take pt's at their treatment facility Monday- Friday and would not be able to run pt until then.  Received f/u call from Pattricia Boss at [facility] requesting information regarding pt's last seizure.  Will give information once received from [facility].   Tomi Bamberger Disposition MHT

## 2014-06-15 NOTE — ED Notes (Signed)
Anxiety decreased

## 2014-06-15 NOTE — Progress Notes (Signed)
Patient ID: [name], male   DOB: 08/13/1976, 38 y.o.   MRN: [id number]  Patient arrived to the Observation unit for admission. Pt voluntary and requesting alcohol detox. Pt denies SI or plans to harm himself at this time. No s/s of distress noted currently. Pt is requesting to go to long term treatment. Pt states he was discharged from the [facility] after becoming intoxicated and he now has no place to go. Pt pleasant and with bright affect on assessment.

## 2014-06-15 NOTE — ED Notes (Signed)
Up to restroom.

## 2014-06-15 NOTE — ED Notes (Signed)
Report received from Autumn RN. Pt. Alert and oriented in no distress denies SI, HI, AVH. Pt. C/o mild headache. Will continue to monitor for safety. Pt. Instructed to come to me with problems or concerns. Q 15 minute checks continue.

## 2014-06-15 NOTE — ED Notes (Signed)
Report received from [facility]. Pt. Alert and oriented in no distress denies SI, HI, AVH. Pt. C/o some nausea and anxiety. Will continue to monitor for safety. Pt. Instructed to come to me with problems or concerns. Q 15 minute checks continue.

## 2014-06-15 NOTE — Plan of Care (Signed)
[facility] Observation Crisis Plan  Reason for Crisis Plan:  Substance Abuse   Plan of Care:  Referral for Inpatient Hospitalization and Referral for Substance Abuse  Family Support:      Current Living Environment:  Living Arrangements: Alone  Insurance:   Hospital Account   Name Acct ID Class Status Primary Coverage   [name], [name] [id number] BEHAVIORAL HEALTH OBSERVATION Open None        Guarantor Account (for Hospital Account [id number])   Name Relation to Pt Service Area Active? Acct Type   Ailene Ravel Self [facility] Yes Behavioral Health   Address Phone       [address] [place], [place] [address] [phone](H)          Coverage Information (for Hospital Account [id number])   Not on file      Legal Guardian:     Primary Care Provider:  Sissy Hoff, MD  Current Outpatient Providers:  na  Psychiatrist:   na  Counselor/Therapist:   na  Compliant with Medications:  Yes  Additional Information:    Renaee Munda 9/19/201511:09 PM

## 2014-06-15 NOTE — ED Notes (Signed)
Up to bathroom
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
## 2014-06-15 NOTE — BH Assessment (Signed)
Assessment completed. Consulted with Alberteen Sam, NP who recommended inpatient treatment. Mercedes Strupp Camprubi-Soms,-PA-C.

## 2014-06-15 NOTE — Progress Notes (Addendum)
F/u calls have been placed to the following: [facility]- spoke with Pattricia Boss, states pt has been declined d/t Medical acuity. [facility]- per [place] still at capacity [facility]- per [place] working off their wait list first.  In addition the following facilities have also been contacted and are at capacity: [facility]- per Johny Chess- per Kandra Nicolas- per Jory Sims- per [facility]- per Elmore Guise- per [facility]- per Christiane Ha- message left, no returned phone call [facility]- per [facility]- per Larita Fife- per [facility]- per Morrie Sheldon [facility]- per Charolotte Eke- per Joyce Gross- no response   Tomi Bamberger Disposition MHT

## 2014-06-15 NOTE — ED Notes (Signed)
Meal provided 
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
## 2014-06-15 NOTE — ED Notes (Signed)
Sandwich and soft drink given.  

## 2014-06-15 NOTE — BH Assessment (Signed)
Inpt treatment recommended. No [facility] beds available. Faxed referral to: [facility]  Clista Bernhardt, [place] Triage Specialist 06/15/2014 6:06 AM
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
## 2014-06-15 NOTE — ED Notes (Signed)
MD in to see patient 

## 2014-06-15 NOTE — ED Notes (Signed)
Per patient decreased anxiety

## 2014-06-15 NOTE — Progress Notes (Signed)
[facility] INPATIENT:  Family/Significant Other Suicide Prevention Education  Suicide Prevention Education:  Patient Refusal for Family/Significant Other Suicide Prevention Education: The patient [name] has refused to provide written consent for family/significant other to be provided Family/Significant Other Suicide Prevention Education during admission and/or prior to discharge.  Physician notified.  Renaee Munda 06/15/2014, 11:04 PM

## 2014-06-15 NOTE — Progress Notes (Signed)
F/u calls have been placed to the following facilities regarding bed availability and pt's referral:  [facility]- per Alinda Money they do have beds available, computer system has been down so review will take longer than usual [facility]- per Pattricia Boss their computer system is down so reviews will take longer because they are doing everything by hand, will contact [facility] once review complete [facility]- per [facility] referral had been received but d/t pt being self-pay pt would have to pay $3,000 up front before receiving tx [facility]- per Crystal currently at capacity [facility]- per Jasmine December may have a poss [facility] bed available and currently reviewing pt to run by manager [facility]- per [place] pt was declined on 3rd shift by Cristino Martes Disposition [facility]

## 2014-06-15 NOTE — ED Notes (Signed)
Meal given
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
## 2014-06-16 NOTE — Progress Notes (Signed)
Patient ID: [name], male   DOB: 02-Jan-1976, 38 y.o.   MRN: [id number] Patient resting in bed; no s/s of distress noted. Respirations regular and unlabored.

## 2014-06-16 NOTE — ED Provider Notes (Signed)
Medical screening examination/treatment/procedure(s) were performed by non-physician practitioner and as supervising physician I was immediately available for consultation/collaboration.   EKG Interpretation None        Dagen Beevers, MD 06/16/14 0619 

## 2014-06-16 NOTE — Progress Notes (Signed)
Patient ID: [name], male   DOB: April 28, 1976, 38 y.o.   MRN: [id number] D: Patient has been pleasant; he is interacting well with staff.  Patient has good insight regarding his addiction and would like long term treatment.  He states he has had periods of sobriety. He is called various rehab facilities in hopes of finding a bed.  He denies any SI/HI/AVH.  He originally stated he would like to go to [facility] in [place].  His CIWA is a 4.  Patient has minimal withdrawal symptoms. A: Continue to monitor medication management and MD orders.  Encourage and support patient. Monitor withdrawal symptoms and medicate as necessary. R: Patient is cooperative; his behavior is appropriate.

## 2014-06-16 NOTE — Progress Notes (Signed)
D: Patient seen resting quietly. Woke up and complained of headache 8/10 and anxiety. Offered and accepted Ibuprofen 800 mg PO PRN for mild pain and Vistaril 25 mg PO PRN for anxiety. Patient denies SI, AH/VH at this time. Offered nourishment and snacks. Patient remains cooperative and compliant. Every 15 minutes check for safety. A: Support and encouragement offered to the patient. Encouraged to continue with the treatment plans. R: No new compliant. Will continue to monitor patient.

## 2014-06-17 ENCOUNTER — Observation Stay (HOSPITAL_COMMUNITY): Admission: AD | Admit: 2014-06-17 | Payer: Self-pay | Source: Intra-hospital | Admitting: Psychiatry

## 2014-06-17 DIAGNOSIS — F411 Generalized anxiety disorder: Secondary | ICD-10-CM | POA: Diagnosis present

## 2014-06-17 DIAGNOSIS — F1020 Alcohol dependence, uncomplicated: Secondary | ICD-10-CM | POA: Diagnosis present

## 2014-06-17 DIAGNOSIS — R748 Abnormal levels of other serum enzymes: Secondary | ICD-10-CM | POA: Diagnosis present

## 2014-06-17 DIAGNOSIS — R45851 Suicidal ideations: Secondary | ICD-10-CM | POA: Diagnosis not present

## 2014-06-17 DIAGNOSIS — IMO0002 Reserved for concepts with insufficient information to code with codable children: Secondary | ICD-10-CM | POA: Diagnosis not present

## 2014-06-17 DIAGNOSIS — F17200 Nicotine dependence, unspecified, uncomplicated: Secondary | ICD-10-CM | POA: Diagnosis present

## 2014-06-17 DIAGNOSIS — F314 Bipolar disorder, current episode depressed, severe, without psychotic features: Secondary | ICD-10-CM | POA: Diagnosis present

## 2014-06-17 MED ORDER — LORAZEPAM 1 MG PO TABS
1.0000 mg | ORAL_TABLET | Freq: Three times a day (TID) | ORAL | Status: AC
  Administered 2014-06-18 (×3): 1 mg via ORAL
  Filled 2014-06-17 (×3): qty 1

## 2014-06-17 MED ORDER — LORAZEPAM 1 MG PO TABS
1.0000 mg | ORAL_TABLET | Freq: Every day | ORAL | Status: AC
  Administered 2014-06-20: 1 mg via ORAL
  Filled 2014-06-17: qty 1

## 2014-06-17 MED ORDER — ALUM & MAG HYDROXIDE-SIMETH 200-200-20 MG/5ML PO SUSP: 30.0000 mL | ORAL | Status: DC | PRN

## 2014-06-17 MED ORDER — MAGNESIUM HYDROXIDE 400 MG/5ML PO SUSP: 30.0000 mL | Freq: Every day | ORAL | Status: DC | PRN

## 2014-06-17 MED ORDER — ACETAMINOPHEN 325 MG PO TABS: 650.0000 mg | ORAL_TABLET | Freq: Four times a day (QID) | ORAL | Status: DC | PRN

## 2014-06-17 MED ORDER — TRAZODONE HCL 50 MG PO TABS
50.0000 mg | ORAL_TABLET | Freq: Every evening | ORAL | Status: DC | PRN
  Administered 2014-06-17 (×2): 50 mg via ORAL
  Filled 2014-06-17 (×6): qty 1

## 2014-06-17 MED ORDER — LORAZEPAM 1 MG PO TABS
1.0000 mg | ORAL_TABLET | Freq: Two times a day (BID) | ORAL | Status: AC
  Administered 2014-06-19 (×2): 1 mg via ORAL
  Filled 2014-06-17 (×2): qty 1

## 2014-06-17 MED ORDER — LORAZEPAM 1 MG PO TABS
1.0000 mg | ORAL_TABLET | Freq: Four times a day (QID) | ORAL | Status: AC
  Administered 2014-06-17 (×3): 1 mg via ORAL
  Filled 2014-06-17 (×3): qty 1

## 2014-06-17 NOTE — Progress Notes (Signed)
Patient ID: [name], male   DOB: 12/08/1975, 38 y.o.   MRN: [id number] Transferred to the adult unit, report given to Christa RN who will be doing the admission. He is asking for his Ativan early at 43 but deferred to adult staff. He is getting scheduled Ativan QID.

## 2014-06-17 NOTE — Tx Team (Signed)
Initial Interdisciplinary Treatment Plan   PATIENT STRESSORS: Substance abuse   PROBLEM LIST: Problem List/Patient Goals Date to be addressed Date deferred Reason deferred Estimated date of resolution  Substance Abuse 06/17/2014                                                      DISCHARGE CRITERIA:  Verbal commitment to aftercare and medication compliance Withdrawal symptoms are absent or subacute and managed without 24-hour nursing intervention  PRELIMINARY DISCHARGE PLAN: Attend 12-step recovery group Outpatient therapy  PATIENT/FAMIILY INVOLVEMENT: This treatment plan has been presented to and reviewed with the patient, [name].  The patient and family have been given the opportunity to ask questions and make suggestions.  Marzetta Board E 06/17/2014, 5:18 PM

## 2014-06-17 NOTE — Progress Notes (Signed)
Pt was being held in Observation Unit but has been accepted to [facility] room #304 bed 2.   Derrell Lolling, MSW Clinical Social Worker [phone]

## 2014-06-17 NOTE — Progress Notes (Signed)
Patient ID: [name], male   DOB: 11/14/1975, 38 y.o.   MRN: [id number]  Patient admitted to the adult unit from observation unit. Patient verbalized understanding of the admission process. Patient reported that he was tired of detoxing and was ready to enter his treatment and start his plan both at [facility] and after he leaves [facility]. Patient appears to be fixated on Ativan and reports that he hopes he can get it early during the admission process. Patient has a past medical history of bipolar disorder, seizures, and anxiety. Q15 minute safety checks initiated and are maintained. Patient is safe at this time.

## 2014-06-17 NOTE — Progress Notes (Signed)
Patient ID: [name], male   DOB: Feb 18, 1976, 38 y.o.   MRN: [id number] D-states Ativan given to him this am helped. But states he still need more detox and has multiple somatic complaints. He has had several prns for nausea, headache of a 4 and for anxiety.He initiated calling the [facility] in [place] to arrange long term rehab for himself. Someone from that program is to come see him on Wed for a face to face eval for admission but he is hopefull about going there. A-Support offered. Monitored for safety Medications as ordered.  R-Seen by Dr Dub Mikes and he is accepting him to the unit and he is happy to be admitted this is what he wanted. No behavior problems. Pleasant.

## 2014-06-18 ENCOUNTER — Encounter (HOSPITAL_COMMUNITY): Payer: Self-pay | Admitting: Psychiatry

## 2014-06-18 DIAGNOSIS — F411 Generalized anxiety disorder: Secondary | ICD-10-CM

## 2014-06-18 MED ORDER — QUETIAPINE FUMARATE 50 MG PO TABS
50.0000 mg | ORAL_TABLET | Freq: Every day | ORAL | Status: DC
  Administered 2014-06-18: 50 mg via ORAL
  Filled 2014-06-18 (×3): qty 1

## 2014-06-18 MED ORDER — QUETIAPINE FUMARATE 25 MG PO TABS
25.0000 mg | ORAL_TABLET | Freq: Three times a day (TID) | ORAL | Status: DC
  Administered 2014-06-18 – 2014-06-19 (×4): 25 mg via ORAL
  Filled 2014-06-18 (×9): qty 1

## 2014-06-18 MED ORDER — TRAZODONE HCL 100 MG PO TABS
100.0000 mg | ORAL_TABLET | Freq: Every evening | ORAL | Status: DC | PRN
  Administered 2014-06-18 – 2014-06-20 (×3): 100 mg via ORAL
  Filled 2014-06-18: qty 1
  Filled 2014-06-18 (×2): qty 28
  Filled 2014-06-18 (×7): qty 1

## 2014-06-18 MED ORDER — HYDROXYZINE HCL 25 MG PO TABS
25.0000 mg | ORAL_TABLET | Freq: Three times a day (TID) | ORAL | Status: DC
Start: 2014-06-18 — End: 2014-06-21
  Administered 2014-06-18 – 2014-06-21 (×10): 25 mg via ORAL
  Filled 2014-06-18: qty 42
  Filled 2014-06-18 (×11): qty 1
  Filled 2014-06-18: qty 42
  Filled 2014-06-18 (×2): qty 1
  Filled 2014-06-18: qty 42
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
## 2014-06-18 NOTE — Progress Notes (Signed)
Patient ID: [name], male   DOB: 01-Dec-1975, 38 y.o.   MRN: [id number] He has been up for groups interacting with peers and staff. Has  Not requested any prn medications. Self inventory this AM: depression 0, hopelessness 4, anxiety 10, denies SI thoughts, withdrawals of craving agitation,  Chilling, cramping, and Irritability c/oeadache and back pain. Goal is to focus on good , attend all groups aand pay close attention.

## 2014-06-18 NOTE — Progress Notes (Signed)
Recreation Therapy Notes   Animal-Assisted Activity/Therapy (AAA/T) Program Checklist/Progress Notes Patient Eligibility Criteria Checklist & Daily Group note for Rec Tx Intervention  Date: 09.22.2015 Time: 2:45pm Location: [address] Dayroom    AAA/T Program Assumption of Risk Form signed by Patient/ or Parent Legal Guardian yes  Patient is free of allergies or sever asthma yes  Patient reports no fear of animals yes  Patient reports no history of cruelty to animals yes   Patient understands his/her participation is voluntary yes  Behavioral Response: Did not attend.   Lyrick Lagrand L Kanaan Kagawa, LRT/CTRS  Dallan Schonberg L 06/18/2014 3:28 PM 
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
## 2014-06-18 NOTE — Tx Team (Signed)
Interdisciplinary Treatment Plan Update   Date Reviewed:  06/18/2014  Time Reviewed:  8:24 AM  Progress in Treatment:   Attending groups: Yes Participating in groups: Yes Taking medication as prescribed: Yes  Tolerating medication: Yes Family/Significant other contact made:  No, but will ask patient for consent for collateral contact Patient understands diagnosis: Yes  Discussing patient identified problems/goals with staff: Yes Medical problems stabilized or resolved: Yes Denies suicidal/homicidal ideation: Yes Patient has not harmed self or others: Yes  For review of initial/current patient goals, please see plan of care.  Estimated Length of Stay:  2-3 days  Reasons for Continued Hospitalization:  Anxiety Depression Medication stabilization Detox Protocol  New Problems/Goals identified:    Discharge Plan or Barriers:   Home with outpatient follow up to be determined  Additional Comments:  38 y.o. male presenting to [facility] ED requesting alcohol detox. Pt stated 'I relapsed after 6 months of being sober". PT reported that he is having suicidal thoughts because of his recent relapse. Pt shared that he has been attending meetings but "got into a bad situation involving some old friends". Pt reported that he has fleeting suicidal thoughts with no plan. Pt shared that he has attempted suicide in the past by cutting his wrist. Pt denies HI and AVH at this time. Pt is reporting multiple depressive symptoms but did not share any issues with his appetite or sleep. Pt denied having access to weapons nor did he report any upcoming court dates or pending criminal charges. Pt reported that he has completed a detox program in the past and was recently sober for 6 months prior to relapsing. PT is alert and oriented x3. Pt is calm and cooperative at this time. PT maintained good eye contact throughout the assessment. Pt speech is normal and thought process is logical and coherent. Pt mood is depressed  and affect is congruent with mood   Patient and CSW reviewed patient's identified goals and treatment plan.  Patient verbalized understanding and agreed to treatment plan.   Attendees:  Patient:  06/18/2014 8:24 AM   Signature:  Sallyanne Havers, MD 06/18/2014 8:24 AM  Signature: Geoffery Lyons, MD 06/18/2014 8:24 AM  Signature:   06/18/2014 8:24 AM  Signature:  06/18/2014 8:24 AM  Signature:   06/18/2014 8:24 AM  Signature:  Juline Patch, LCSW 06/18/2014 8:24 AM  Signature:  Belenda Cruise Drinkard, LCSW-A 06/18/2014 8:24 AM  Signature:  Leisa Lenz, Care Coordinator [facility] 06/18/2014 8:24 AM  Signature:  Kathi Simpers, RN 06/18/2014 8:24 AM  Signature: Michaelle Birks, RN 06/18/2014  8:24 AM  Signature:   Onnie Boer, RN [facility] 06/18/2014  8:24 AM  Signature:  06/18/2014  8:24 AM    Scribe for Treatment Team:   Juline Patch,  06/18/2014 8:24 AM

## 2014-06-18 NOTE — [facility] Suicide Risk Assessment (Signed)
[facility] INPATIENT:  Family/Significant Other Suicide Prevention Education  Suicide Prevention Education:  Patient Refusal for Family/Significant Other Suicide Prevention Education: The patient [name] has refused to provide written consent for family/significant other to be provided Family/Significant Other Suicide Prevention Education during admission and/or prior to discharge.  Physician notified.  Maikayla Beggs Hairston 06/18/2014, 1:10 PM

## 2014-06-18 NOTE — [facility] Group Notes (Signed)
[facility] LCSW Group Therapy      Feelings About Diagnosis 1:15 - 2:30 PM         06/18/2014    Type of Therapy:  Group Therapy  Participation Level:  Active  Participation Quality:  Appropriate  Affect:  Appropriate  Cognitive:  Alert and Appropriate  Insight:  Developing/Improving and Engaged  Engagement in Therapy:  Developing/Improving and Engaged  Modes of Intervention:  Discussion, Education, Exploration, Problem-Solving, Rapport Building, Support  Summary of Progress/Problems:  Patient actively participated in group. Patient discussed past and present diagnosis and the effects it has had on  life.  Patient talked about family and society being judgmental and the stigma associated with having a mental health diagnosis.  He shared he accepts his diagnosis and is pleased to know that he can get help.  Patient stated his diagnosis is what he has not who he is.  Wynn Banker 06/18/2014

## 2014-06-18 NOTE — Progress Notes (Signed)
Pt observed in the dayroom watching TV and talking with peers.  Pt reports he is here for alcohol detox.  He states he had been sober for about 6 months and had been working, but recently relapsed because he was around the wrong people.  Pt is frustrated with himself because he had been doing good.  He wants to go to a long term program in [place] and has already called and arranged to meet with staff at [facility] on Wednesday for a bed.  Pt reports to this writer that he wants to stay clean this time.  He denies SI/HI/AV.  He is pleasant/cooperative with staff.  Pt makes his needs known to staff.  Support and encouragement offered.  Safety maintained with q15 minute checks.

## 2014-06-19 DIAGNOSIS — F410 Panic disorder [episodic paroxysmal anxiety] without agoraphobia: Secondary | ICD-10-CM

## 2014-06-19 MED ORDER — QUETIAPINE FUMARATE 50 MG PO TABS
75.0000 mg | ORAL_TABLET | Freq: Every day | ORAL | Status: DC
  Administered 2014-06-19: 75 mg via ORAL
  Filled 2014-06-19 (×2): qty 1

## 2014-06-19 MED ORDER — QUETIAPINE FUMARATE 100 MG PO TABS: 100.0000 mg | ORAL_TABLET | Freq: Every day | ORAL | Status: DC

## 2014-06-19 NOTE — Progress Notes (Signed)
Pt has been up and active in the milieu today. He denied any depression but rated his hopelessness a 2 and anxiety a 5 on his self-inventory.  He denied any S/H ideation or A/V/H. He is trying to get into a program in [place] for long-term treatment.

## 2014-06-19 NOTE — [facility] Group Notes (Signed)
[facility] LCSW Group Therapy  06/19/2014 3:04 PM  Type of Therapy:  Group Therapy  Participation Level:  Did Not Attend - patient was meeting with MD   Wynn Banker 06/19/2014, 3:04 PM

## 2014-06-19 NOTE — Progress Notes (Signed)
[facility] MD Progress Note  06/19/2014 4:06 PM CHAPMAN MATTEUCCI  MRN:  [id number] Subjective:  [name] states he would like to have his anxiety under better control. He states that the anxiety has  always been a trigger for his relapses. He still states that the Klonopin has been the most effective medication he has found trough the years but understands he cant take it. The combination of Vistaril and Seroquel 25 he states is making him sleepy but he has seen some almost initial benefit coming from it. He would like to give this a little longer to see if the acute sedation goes away. He is awaiting a response from the residential treatment program he is hoping to go to Diagnosis:   DSM5: Substance/Addictive Disorders:  Alcohol Related Disorder - Severe (303.90) Depressive Disorders:  Major Depressive Disorder - Moderate (296.22) Total Time spent with patient: 30 minutes  Axis I: Generalized Anxiety Disorder Panic Attacks  ADL's:  Intact  Sleep: Fair  Appetite:  Fair  Psychiatric Specialty Exam: Physical Exam  Review of Systems  Constitutional: Negative.   HENT: Negative.   Eyes: Negative.   Respiratory: Negative.   Cardiovascular: Negative.   Gastrointestinal: Negative.   Genitourinary: Negative.   Musculoskeletal: Negative.   Skin: Negative.   Neurological: Negative.   Endo/Heme/Allergies: Negative.   Psychiatric/Behavioral: Positive for depression and substance abuse. The patient is nervous/anxious.     Blood pressure 126/80, pulse 82, temperature 97.7 F (36.5 C), temperature source Oral, resp. rate 18, height  (1.854 m), weight 77.111 kg (170 lb), SpO2 99.00%.Body mass index is 22.43 kg/(m^2).  General Appearance: Fairly Groomed  Patent attorney::  Fair  Speech:  Clear and Coherent  Volume:  fluctuates  Mood:  Anxious and Depressed  Affect:  anxious, worried  Thought Process:  Coherent and Goal Directed  Orientation:  Full (Time, Place, and Person)  Thought Content:  symptoms  worries concerns  Suicidal Thoughts:  No  Homicidal Thoughts:  No  Memory:  Immediate;   Fair Recent;   Fair Remote;   Fair  Judgement:  Fair  Insight:  Present  Psychomotor Activity:  Restlessness  Concentration:  Fair  Recall:  [employer] of Knowledge:NA  Language: Fair  Akathisia:  No  Handed:    AIMS (if indicated):     Assets:  Desire for Improvement  Sleep:  Number of Hours: 4.75   Musculoskeletal: Strength & Muscle Tone: within normal limits Gait & Station: normal Patient leans: N/A  Current Medications: Current Facility-Administered Medications  Medication Dose Route Frequency Provider Last Rate Last Dose  . acetaminophen (TYLENOL) tablet 650 mg  650 mg Oral Q4H PRN Nanine Means, NP   650 mg at 06/16/14 0549  . acetaminophen (TYLENOL) tablet 650 mg  650 mg Oral Q6H PRN Rachael Fee, MD      . alum & mag hydroxide-simeth (MAALOX/MYLANTA) 200-200-20 MG/5ML suspension 30 mL  30 mL Oral PRN Nanine Means, NP      . alum & mag hydroxide-simeth (MAALOX/MYLANTA) 200-200-20 MG/5ML suspension 30 mL  30 mL Oral Q4H PRN Rachael Fee, MD      . gabapentin (NEURONTIN) capsule 300 mg  300 mg Oral TID Nanine Means, NP   300 mg at 06/19/14 1152  . hydrOXYzine (ATARAX/VISTARIL) tablet 25 mg  25 mg Oral Q6H PRN Nanine Means, NP   25 mg at 06/16/14 2018  . hydrOXYzine (ATARAX/VISTARIL) tablet 25 mg  25 mg Oral TID Rachael Fee, MD  25 mg at 06/19/14 1152  . ibuprofen (ADVIL,MOTRIN) tablet 800 mg  800 mg Oral Q6H PRN Nanine Means, NP   800 mg at 06/19/14 4540  . LORazepam (ATIVAN) tablet 1 mg  1 mg Oral BID Nehemiah Massed, MD   1 mg at 06/19/14 0816   Followed by  . [START ON 06/20/2014] LORazepam (ATIVAN) tablet 1 mg  1 mg Oral Daily Nehemiah Massed, MD      . magnesium hydroxide (MILK OF MAGNESIA) suspension 30 mL  30 mL Oral Daily PRN Rachael Fee, MD      . nicotine (NICODERM CQ - dosed in mg/24 hours) patch 21 mg  21 mg Transdermal Daily Nanine Means, NP   21 mg at 06/19/14 0816  .  ondansetron (ZOFRAN) tablet 4 mg  4 mg Oral Q8H PRN Nanine Means, NP   4 mg at 06/17/14 1248  . QUEtiapine (SEROQUEL) tablet 25 mg  25 mg Oral TID Rachael Fee, MD   25 mg at 06/19/14 1152  . QUEtiapine (SEROQUEL) tablet 50 mg  50 mg Oral QHS Rachael Fee, MD   50 mg at 06/18/14 2248  . thiamine (VITAMIN B-1) tablet 100 mg  100 mg Oral Daily Nanine Means, NP   100 mg at 06/19/14 0816  . traZODone (DESYREL) tablet 100 mg  100 mg Oral QHS,MR X 1 Rachael Fee, MD   100 mg at 06/18/14 2248    Lab Results: No results found for this or any previous visit (from the past 48 hour(s)).  Physical Findings: AIMS: Facial and Oral Movements Muscles of Facial Expression: None, normal Lips and Perioral Area: None, normal Jaw: None, normal Tongue: None, normal,Extremity Movements Upper (arms, wrists, hands, fingers): None, normal Lower (legs, knees, ankles, toes): None, normal, Trunk Movements Neck, shoulders, hips: None, normal, Overall Severity Severity of abnormal movements (highest score from questions above): None, normal Incapacitation due to abnormal movements: None, normal Patient's awareness of abnormal movements (rate only patient's report): No Awareness, Dental Status Current problems with teeth and/or dentures?: No Does patient usually wear dentures?: No  CIWA:  CIWA-Ar Total: 7 COWS:  COWS Total Score: 5  Treatment Plan Summary: Daily contact with patient to assess and evaluate symptoms and progress in treatment Medication management  Plan: Supportive approach/coping skills/relapse prevention           CBT: mindulfness           Note: Will change the Seroquel to HS as Jalan feels is too sedating during the day and           use only the Vistaril during the day.  Medical Decision Making Problem Points:  Review of psycho-social stressors (1) Data Points:  Review of medication regiment & side effects (2) Review of new medications or change in dosage (2)  I certify that inpatient  services furnished can reasonably be expected to improve the patient's condition.   Zissel Biederman A 06/19/2014, 4:06 PM

## 2014-06-19 NOTE — Progress Notes (Signed)
D   Pt is appropriate and pleasant    He interacts well with his peers and is compliant with treatment   He denies any signs and symptoms of withdrawal   He reports wanting long term treatment and hopes to be able to leave here and go straight to treatment A   Verbal support given   Medications administered and effectiveness monitored   Q 15 min checks R   Pt receptive and is presently safe

## 2014-06-19 NOTE — [facility] Group Notes (Signed)
[facility] LCSW Aftercare Discharge Planning Group Note   06/19/2014 9:43 AM    Participation Quality:  Appropraite  Mood/Affect:  Appropriate  Depression Rating:  1  Anxiety Rating:  1  Thoughts of Suicide:  No  Will you contract for safety?   NA  Current AVH:  No  Plan for Discharge/Comments:  Patient attended discharge planning group and actively participated in group.  Patient shared he is doing much better and hopes to discharge soon.  He is looking forward to admitting to [facility] in [place] at discharge.  Patient advised CSW spoke with Mr. Delford Field and he will be coming to meeting with him around 10:30 this morning.  CSW provided all participants with daily workbook.   Transportation Means: Patient has transportation.   Supports:  Patient has a limited support system.   Leah Thornberry, [name] July

## 2014-06-19 NOTE — Progress Notes (Signed)
Adult Psychoeducational Group Note  Date:  06/19/2014 Time:  10:00 am  Group Topic/Focus:  Wellness Toolbox:   The focus of this group is to discuss various aspects of wellness, balancing those aspects and exploring ways to increase the ability to experience wellness.  Patients will create a wellness toolbox for use upon discharge.  Participation Level:  Active  Participation Quality:  Appropriate, Sharing and Supportive  Affect:  Appropriate  Cognitive:  Appropriate  Insight: Appropriate  Engagement in Group:  Engaged  Modes of Intervention:  Discussion, Education, Socialization and Support  Additional Comments:  Pt stated that when he is playing guitar, attending meetings and opening up to others that he is doing well. Pt stated that when he is not doing well that he is anti-social, isolative and angry. Pt stated that his pastor, sponsor and members of his NA meeting are his support system to contact during a crisis.   Laural Benes, Davielle Lingelbach 06/19/2014, 10:43 AM

## 2014-06-20 MED ORDER — QUETIAPINE FUMARATE 50 MG PO TABS
50.0000 mg | ORAL_TABLET | Freq: Every day | ORAL | Status: DC
  Administered 2014-06-20: 50 mg via ORAL
  Filled 2014-06-20: qty 14
  Filled 2014-06-20 (×2): qty 1

## 2014-06-20 NOTE — [facility] Group Notes (Signed)
[facility] LCSW Group Therapy  [facility] 1:15 - 2:30 PM  06/20/2014   Type of Therapy:  Group Therapy  Participation Level: Active  Participation Quality:  Attentive  Affect:  Appropriate  Cognitive:  Appropriate  Insight:  Developing/Improving   Engagement in Therapy:  Developing/Improving   Modes of Intervention:  Discussion, Education, Exploration, Problem-Solving, Rapport Building, Support   Summary of Progress/Problems:   Patient was attentive to speaker from the [employer] [facility] as he shared his story of dealing with mental health/substance abuse issues and overcoming it by working a recovery program.  Patient nodded in agreement with speaker and stated he could identify with speaker.  Patient expressed interest in their programs and services and received information on their agency.    Wynn Banker 06/20/2014  3:20 PM

## 2014-06-20 NOTE — Progress Notes (Signed)
Pt is appropriate in affect and anxious in mood. Pt observed interacting appropriately with others. Pt actively participated within the milieu. Pt endorses anxiety in reference to his withdrawal symptoms. Pt currently denying any SI/HI at this time.  A: Writer administered scheduled and prn medications to pt. Continued support and availability as needed was extended to this pt. Staff continue to monitor pt with q81min checks.  R: No adverse drug reactions noted. Pt receptive to treatment. Pt remains safe at this time.

## 2014-06-20 NOTE — [facility] Group Notes (Signed)
0900 nursing orientation group   The focus of this group is to educate the patient on the purpose and policies of crisis stabilization and provide a format to answer questions about their admission.  The group details unit policies and expectations of patients while admitted.   Pt did not attend he was in bed asleep.  

## 2014-06-20 NOTE — Progress Notes (Signed)
Patient did attend the evening karaoke group. Pt was engaged, supportive, and participated by singing a song.  

## 2014-06-20 NOTE — Progress Notes (Signed)
[facility] MD Progress Note  06/20/2014 1:54 PM [name]  MRN:  [id number] Subjective:  [name] states he is still not feeling well. He has not heard from the residential treatment program. He is having a hard time tolerating the Seroquel. He is willing to continue to take it at the lower dosage. He states he is worried about the anxiety when he gets out of here and becomes really bad. States he does not want to relapse. Diagnosis:   DSM5: Substance/Addictive Disorders:  Alcohol Related Disorder - Severe (303.90) Depressive Disorders:  Major Depressive Disorder - Moderate (296.22) Total Time spent with patient: 30 minutes  Axis I: Generalized Anxiety Disorder, Panic Attacks  ADL's:  Intact  Sleep: Fair  Appetite:  Fair  Psychiatric Specialty Exam: Physical Exam  Review of Systems  Constitutional: Positive for malaise/fatigue.  HENT: Negative.   Eyes: Negative.   Respiratory: Negative.   Cardiovascular: Negative.   Gastrointestinal: Negative.   Genitourinary: Negative.   Musculoskeletal: Negative.   Skin: Negative.   Neurological: Negative.   Endo/Heme/Allergies: Negative.   Psychiatric/Behavioral: Positive for depression and substance abuse. The patient is nervous/anxious.     Blood pressure 104/76, pulse 104, temperature 98.1 F (36.7 C), temperature source Oral, resp. rate 20, height  (1.854 m), weight 77.111 kg (170 lb), SpO2 99.00%.Body mass index is 22.43 kg/(m^2).  General Appearance: Fairly Groomed  Patent attorney::  Fair  Speech:  Clear and Coherent  Volume:  Decreased  Mood:  Anxious and worried  Affect:  anxious, worried  Thought Process:  Coherent and Goal Directed  Orientation:  Full (Time, Place, and Person)  Thought Content:  symptoms worries concerns  Suicidal Thoughts:  No  Homicidal Thoughts:  No  Memory:  Immediate;   Fair Recent;   Fair Remote;   Fair  Judgement:  Fair  Insight:  Present  Psychomotor Activity:  Restlessness  Concentration:  Fair   Recall:  [employer] of Knowledge:NA  Language: Fair  Akathisia:  No  Handed:    AIMS (if indicated):     Assets:  Desire for Improvement  Sleep:  Number of Hours: 6.75   Musculoskeletal: Strength & Muscle Tone: within normal limits Gait & Station: normal Patient leans: N/A  Current Medications: Current Facility-Administered Medications  Medication Dose Route Frequency Provider Last Rate Last Dose  . acetaminophen (TYLENOL) tablet 650 mg  650 mg Oral Q4H PRN Nanine Means, NP   650 mg at 06/16/14 0549  . acetaminophen (TYLENOL) tablet 650 mg  650 mg Oral Q6H PRN Rachael Fee, MD      . alum & mag hydroxide-simeth (MAALOX/MYLANTA) 200-200-20 MG/5ML suspension 30 mL  30 mL Oral PRN Nanine Means, NP      . alum & mag hydroxide-simeth (MAALOX/MYLANTA) 200-200-20 MG/5ML suspension 30 mL  30 mL Oral Q4H PRN Rachael Fee, MD      . gabapentin (NEURONTIN) capsule 300 mg  300 mg Oral TID Nanine Means, NP   300 mg at 06/20/14 1204  . hydrOXYzine (ATARAX/VISTARIL) tablet 25 mg  25 mg Oral Q6H PRN Nanine Means, NP   25 mg at 06/16/14 2018  . hydrOXYzine (ATARAX/VISTARIL) tablet 25 mg  25 mg Oral TID Rachael Fee, MD   25 mg at 06/20/14 1204  . ibuprofen (ADVIL,MOTRIN) tablet 800 mg  800 mg Oral Q6H PRN Nanine Means, NP   800 mg at 06/20/14 0840  . magnesium hydroxide (MILK OF MAGNESIA) suspension 30 mL  30 mL Oral  Daily PRN Rachael Fee, MD      . nicotine (NICODERM CQ - dosed in mg/24 hours) patch 21 mg  21 mg Transdermal Daily Nanine Means, NP   21 mg at 06/20/14 9629  . ondansetron (ZOFRAN) tablet 4 mg  4 mg Oral Q8H PRN Nanine Means, NP   4 mg at 06/17/14 1248  . QUEtiapine (SEROQUEL) tablet 50 mg  50 mg Oral QHS Rachael Fee, MD      . thiamine (VITAMIN B-1) tablet 100 mg  100 mg Oral Daily Nanine Means, NP   100 mg at 06/20/14 0837  . traZODone (DESYREL) tablet 100 mg  100 mg Oral QHS,MR X 1 Rachael Fee, MD   100 mg at 06/19/14 2134    Lab Results: No results found for this or any  previous visit (from the past 48 hour(s)).  Physical Findings: AIMS: Facial and Oral Movements Muscles of Facial Expression: None, normal Lips and Perioral Area: None, normal Jaw: None, normal Tongue: None, normal,Extremity Movements Upper (arms, wrists, hands, fingers): None, normal Lower (legs, knees, ankles, toes): None, normal, Trunk Movements Neck, shoulders, hips: None, normal, Overall Severity Severity of abnormal movements (highest score from questions above): None, normal Incapacitation due to abnormal movements: None, normal Patient's awareness of abnormal movements (rate only patient's report): No Awareness, Dental Status Current problems with teeth and/or dentures?: No Does patient usually wear dentures?: No  CIWA:  CIWA-Ar Total: 3 COWS:  COWS Total Score: 5  Treatment Plan Summary: Daily contact with patient to assess and evaluate symptoms and progress in treatment Medication management  Plan: Supportive approach/coping skills/relapse prevention           Decrease the Seroquel to 50 mg HS           Continue to work towards ways of dealing with the anxiety           Explore other placement options  Medical Decision Making Problem Points:  Review of psycho-social stressors (1) Data Points:  Review of medication regiment & side effects (2) Review of new medications or change in dosage (2)  I certify that inpatient services furnished can reasonably be expected to improve the patient's condition.   Jerell Demery A 06/20/2014, 1:54 PM

## 2014-06-20 NOTE — Progress Notes (Signed)
D: Pt is anxious in affect and pleasant in mood. Pt endorses anxiety this evening. Pt relays his anxiety to not knowing whether or not he was officially accepted into his preferred rehab facility. Pt remains hopeful. Pt actively participates within the milieu.  A: Writer administered scheduled medications to pt. Continued support and availability as needed was extended to this pt. Staff continue to monitor pt with q18min checks.  R: No adverse drug reactions noted. Pt receptive to treatment. Pt remains safe at this time.

## 2014-06-20 NOTE — Progress Notes (Signed)
Nursing Shift Note D: anxious mood, flat affect, denies HI, SI, AVH. Cooperative with staff instructions. Complains of headache rated #5, prn administered for headache. Pt currently resting quietly. A: did not attend group. Support and encouragement given. Pt encouraged to increase fluid intake. Medication compliant. R: continue to monitor and stabilize. Q15 minute checks for safety.

## 2014-06-21 DIAGNOSIS — F1020 Alcohol dependence, uncomplicated: Principal | ICD-10-CM

## 2014-06-21 DIAGNOSIS — F3130 Bipolar disorder, current episode depressed, mild or moderate severity, unspecified: Secondary | ICD-10-CM

## 2014-06-21 MED ORDER — GABAPENTIN 300 MG PO CAPS: 300.0000 mg | ORAL_CAPSULE | Freq: Three times a day (TID) | ORAL | Status: DC

## 2014-06-21 MED ORDER — QUETIAPINE FUMARATE 50 MG PO TABS: 50.0000 mg | ORAL_TABLET | Freq: Every day | ORAL | Status: DC

## 2014-06-21 MED ORDER — TRAZODONE HCL 100 MG PO TABS: 100.0000 mg | ORAL_TABLET | Freq: Every evening | ORAL | Status: DC | PRN

## 2014-06-21 MED ORDER — IBUPROFEN 200 MG PO TABS: 800.0000 mg | ORAL_TABLET | Freq: Four times a day (QID) | ORAL | Status: DC | PRN

## 2014-06-21 MED ORDER — HYDROXYZINE HCL 25 MG PO TABS: 25.0000 mg | ORAL_TABLET | Freq: Three times a day (TID) | ORAL | Status: DC

## 2014-06-21 NOTE — Tx Team (Signed)
Interdisciplinary Treatment Plan Update   Date Reviewed:  06/21/2014  Time Reviewed:  8:42 AM  Progress in Treatment:   Attending groups: Yes Participating in groups: Yes Taking medication as prescribed: Yes  Tolerating medication: Yes Family/Significant other contact made:  No, patient declined collateral contact Patient understands diagnosis: Yes and states he is ready to get his life on track.  Discussing patient identified problems/goals with staff: Yes Medical problems stabilized or resolved: Yes Denies suicidal/homicidal ideation: Yes Patient has not harmed self or others: Yes  For review of initial/current patient goals, please see plan of care.  Estimated Length of Stay: Discharge today  Reasons for Continued Hospitalization:    New Problems/Goals identified:    Discharge Plan or Barriers:  [facility] and [facility]  Additional Comments:   Patient and CSW reviewed patient's identified goals and treatment plan.  Patient verbalized understanding and agreed to treatment plan.   Attendees:  Patient:  06/21/2014 8:42 AM   Signature: 06/21/2014 8:42 AM  Signature: Geoffery Lyons, MD 06/21/2014 8:42 AM  Signature:  Serena Colonel, NP 06/21/2014 8:42 AM  Signature:  06/21/2014 8:42 AM  Signature:   06/21/2014 8:42 AM  Signature:  Juline Patch, LCSW 06/21/2014 8:42 AM  Signature:  Trula Slade, LCSW-A 06/21/2014 8:42 AM  Signature:  Tomasita Morrow, Care Coordinator [facility] 06/21/2014 8:42 AM  Signature: Genelle Gather, RN 06/21/2014 8:42 AM  Signature:Britney Chales Abrahams, RN 06/21/2014  8:42 AM  Signature:   Onnie Boer, RN [facility] 06/21/2014  8:42 AM  Signature:  06/21/2014  8:42 AM    Scribe for Treatment Team:   Juline Patch,  06/21/2014 8:42 AM

## 2014-06-21 NOTE — Discharge Summary (Signed)
Physician Discharge Summary Note  Patient:  [name] is an 38 y.o., male MRN:  [id number] DOB:  16-Apr-1976 Patient phone:  [phone] (home)  Patient address:   [address] [place] [address],  Total Time spent with patient: Greater than 30 minutes  Date of Admission:  06/15/2014 Date of Discharge: 06/21/14  Reason for Admission: Alcohol detox  Discharge Diagnoses: Active Problems:   Alcohol dependence   Psychiatric Specialty Exam: Physical Exam  Psychiatric: His speech is normal and behavior is normal. Judgment and thought content normal. His mood appears not anxious. His affect is not angry, not blunt, not labile and not inappropriate. Cognition and memory are normal. He does not exhibit a depressed mood.    Review of Systems  Constitutional: Negative.   HENT: Negative.   Eyes: Negative.   Respiratory: Negative.   Cardiovascular: Negative.   Gastrointestinal: Negative.   Genitourinary: Negative.   Musculoskeletal: Negative.   Skin: Negative.   Neurological: Negative.   Endo/Heme/Allergies: Negative.   Psychiatric/Behavioral: Positive for depression and substance abuse (Alcohol dependence). Negative for suicidal ideas, hallucinations and memory loss. The patient has insomnia (Stable). The patient is not nervous/anxious.     Blood pressure 115/72, pulse 112, temperature 97.5 F (36.4 C), temperature source Oral, resp. rate 16, height  (1.854 m), weight 77.111 kg (170 lb), SpO2 99.00%.Body mass index is 22.43 kg/(m^2).   Past Psychiatric History: Diagnosis: Alcohol dependence, Bipolar I disorder, most recent episode (or current) depressed, severe, without mention of psychotic behavior  Hospitalizations: [facility] adult unit  Outpatient Care: [facility] in [place], [place]  Substance Abuse Care: Other:  [facility]  Self-Mutilation: NA  Suicidal Attempts: NA  Violent Behaviors: NA   Musculoskeletal: Strength & Muscle Tone: within normal  limits Gait & Station: normal Patient leans: N/A  DSM5: Schizophrenia Disorders:  NA Obsessive-Compulsive Disorders:  NA Trauma-Stressor Disorders:  NA Substance/Addictive Disorders:  Alcohol Related Disorder - Severe (303.90) Depressive Disorders:  Bipolar I disorder, most recent episode (or current) depressed, severe, without mention of psychotic behavior, Generalized anxiety disorder   Axis Diagnosis:  AXIS I:  Alcohol dependence, Bipolar I disorder, most recent episode (or current) depressed, severe, without mention of psychotic behavior AXIS II:  Deferred AXIS III:   Past Medical History  Diagnosis Date  . Seizures   . Bipolar 1 disorder   . Anxiety    AXIS IV:  other psychosocial or environmental problems and Alcoholism, chronic, mental illness, chronic AXIS V:  63  Level of Care:  [facility]  Hospital Course:  38 Y/o male who states that he was discharged from our unit in December and went to [facility]. States it did not work out. He left and went to an [facility]. States he staid there for 9 months and relapsed after he got with some of the old crowd. Relapsed on alcohol. Has been drinking every day for a week. States he has been drinking 6-12 pack every day. States that he wants to get his life back together.   [name] was admitted to the hospital with his UDS test results showing <11. However, his toxicology tests results showed a blood alcohol level of 119. He admitted having been drinking alcohol 6-12 packs daily. He was here for alcohol detox. [name]'s recent lab reports indicated elevated liver enzymes as a result not a candidate for Librium detoxification protocols. His detoxification treatment was achieved using Ativan detox regimen on a tapering dose format. This was used in place  of Librium detox protocol because Librium is a long acting benzodiazepine with a long half life. If Librium was used for this detox treatment, will impose on a already compromised liver functions.  This way, [name] received a cleaner detoxification treatment without endangering his liver functions any further. He was also enrolled in the group counseling sessions, AA/NA meetings being offered and held on this unit. He participated and learned coping skills.   Besides the detoxification treatments received here and scheduled outpatient psychiatric services, [name] also was ordered, received and discharged on Trazodone 100 mg at bedtime for sleep, Seroquel 50 mg Q bedtime for mood control, Gabapentin 300 mg three times daily for substance withdrawal syndrome and Hydroxyzine 25 mg three times daily as needed for anxiety. [name] has completed detox treatment and his mood is stable. This is evidenced by his reports of improved mood and absence of substance withdrawal symptoms. He will resume psychiatric care and routine medication management at the [facility] clinic in [place], [place]. And for substance abuse treatment, he will be receiving this service at the [facility] in [place], [place] as well. He is provided with all the pertinent information required to make this appointment without problems.  Upon discharge, [name] adamantly denies any suicidal, homicidal ideations, auditory, visual hallucinations, delusional thoughts, paranoia and or withdrawal symptoms. He left [facility] with all personal belongings in no apparent distress. He received 14 days worth supply samples of his [facility] discharge medications provided by [facility] pharmacy. Transportation per [facility] staff.  Consults:  psychiatry  Significant Diagnostic Studies:  labs: CBC with diff, CMP, UDS, toxicology tests, U/A  Discharge Vitals:   Blood pressure 115/72, pulse 112, temperature 97.5 F (36.4 C), temperature source Oral, resp. rate 16, height  (1.854 m), weight 77.111 kg (170 lb), SpO2 99.00%. Body mass index is 22.43 kg/(m^2). Lab Results:   No results found for this or any previous visit (from the past 72 hour(s)).  Physical Findings: AIMS:  Facial and Oral Movements Muscles of Facial Expression: None, normal Lips and Perioral Area: None, normal Jaw: None, normal Tongue: None, normal,Extremity Movements Upper (arms, wrists, hands, fingers): None, normal Lower (legs, knees, ankles, toes): None, normal, Trunk Movements Neck, shoulders, hips: None, normal, Overall Severity Severity of abnormal movements (highest score from questions above): None, normal Incapacitation due to abnormal movements: None, normal Patient's awareness of abnormal movements (rate only patient's report): No Awareness, Dental Status Current problems with teeth and/or dentures?: No Does patient usually wear dentures?: No  CIWA:  CIWA-Ar Total: 1 COWS:  COWS Total Score: 5  Psychiatric Specialty Exam: See Psychiatric Specialty Exam and Suicide Risk Assessment completed by Attending Physician prior to discharge.  Discharge destination:  Other:  [facility]  Is patient on multiple antipsychotic therapies at discharge:  No   Has Patient had three or more failed trials of antipsychotic monotherapy by history:  No  Recommended Plan for Multiple Antipsychotic Therapies: NA    Medication List       Indication   gabapentin 300 MG capsule  Commonly known as:  NEURONTIN  Take 1 capsule (300 mg total) by mouth 3 (three) times daily. For substance withdrawal syndrome   Indication:  Alcohol Withdrawal Syndrome     hydrOXYzine 25 MG tablet  Commonly known as:  ATARAX/VISTARIL  Take 1 tablet (25 mg total) by mouth 3 (three) times daily. For anxiety   Indication:  Tension, Anxiety     ibuprofen 200 MG tablet  Commonly known as:  ADVIL,MOTRIN  Take 4 tablets (800 mg total) by mouth every 6 (six) hours as needed for mild pain.   Indication:  Mild to Moderate Pain     QUEtiapine 50 MG tablet  Commonly known as:  SEROQUEL  Take 1 tablet (50 mg total) by mouth at bedtime. For mood control   Indication:  Mood control     traZODone 100  MG tablet  Commonly known as:  DESYREL  Take 1 tablet (100 mg total) by mouth at bedtime and may repeat dose one time if needed. For sleep   Indication:  Trouble Sleeping       Follow-up Information   Follow up with [facility] On 06/21/2014. (Friday, June 21, 2014 - Mr. Delford Field to transport patient to program.)    Contact information:   [address] [place], [place]   [address]  [phone]      Follow up with [facility] On 06/25/2014. (Please go to [facility] 's walk in clinic on Tuesday, June 25, 2014 or any weekday between 8AM -5PM for medication management/counseling)    Contact information:   [address] [place], [place]   [address]  [phone]     Follow-up recommendations:  Activity:  As tolerated Diet: As recommended by your primary care doctor. Keep all scheduled follow-up appointments as recommended.  Comments: Take all your medications as prescribed by your mental healthcare provider. Report any adverse effects and or reactions from your medicines to your outpatient provider promptly. Patient is instructed and cautioned to not engage in alcohol and or illegal drug use while on prescription medicines. In the event of worsening symptoms, patient is instructed to call the crisis hotline, 911 and or go to the nearest ED for appropriate evaluation and treatment of symptoms. Follow-up with your primary care provider for your other medical issues, concerns and or health care needs.  Total Discharge Time:  Greater than 30 minutes.  Signed: Sanjuana Kava, PMHNP-BC 06/21/2014, 11:45 AM I personally assessed the patient and formulated the plan Madie Reno A. Dub Mikes, M.D.

## 2014-06-21 NOTE — [facility] Group Notes (Signed)
[facility] LCSW Aftercare Discharge Planning Group Note   06/21/2014 10:14 AM    Participation Quality:  Appropraite  Mood/Affect:  Appropriate  Depression Rating:  1  Anxiety Rating:  1  Thoughts of Suicide:  No  Will you contract for safety?   NA  Current AVH:  No  Plan for Discharge/Comments:  Patient attended discharge planning group and actively participated in group.  Patient reports being much better today and hopes to discharge to [facility].  CSW provided all participants with daily workbook.   Transportation Means: Patient has transportation.   Supports:  Patient has a support system.   Asencion Loveday, [name] July

## 2014-06-21 NOTE — [facility] Suicide Risk Assessment (Signed)
Suicide Risk Assessment  Discharge Assessment     Demographic Factors:  Male and Caucasian  Total Time spent with patient: 30 minutes  Psychiatric Specialty Exam:     Blood pressure 115/72, pulse 112, temperature 97.5 F (36.4 C), temperature source Oral, resp. rate 16, height  (1.854 m), weight 77.111 kg (170 lb), SpO2 99.00%.Body mass index is 22.43 kg/(m^2).  General Appearance: Fairly Groomed  Patent attorney::  Fair  Speech:  Clear and Coherent  Volume:  Normal  Mood:  Anxious and worried  Affect:  Appropriate  Thought Process:  Coherent and Goal Directed  Orientation:  Full (Time, Place, and Person)  Thought Content:  plans as he moves on, relapse prevention plan  Suicidal Thoughts:  No  Homicidal Thoughts:  No  Memory:  Immediate;   Fair Recent;   Fair Remote;   Fair  Judgement:  Fair  Insight:  Present  Psychomotor Activity:  Normal  Concentration:  Fair  Recall:  [employer] of Knowledge:NA  Language: Fair  Akathisia:  No  Handed:  Right  AIMS (if indicated):     Assets:  Desire for Improvement Social Support  Sleep:  Number of Hours: 5.75    Musculoskeletal: Strength & Muscle Tone: within normal limits Gait & Station: normal Patient leans: N/A   Mental Status Per Nursing Assessment::   On Admission:  NA  Current Mental Status by Physician: In full contact with reality. There are no S/S of withdrawal. There are no active SI plans or intent. [facility]    Loss Factors: NA  Historical Factors: NA  Risk Reduction Factors:   Living with another person, especially a relative and Positive social support  Continued Clinical Symptoms:  Panic Attacks Alcohol/Substance Abuse/Dependencies  Cognitive Features That Contribute To Risk:  Closed-mindedness Polarized thinking Thought constriction (tunnel vision)    Suicide Risk:  Minimal: No identifiable suicidal ideation.  Patients presenting  with no risk factors but with morbid ruminations; may be classified as minimal risk based on the severity of the depressive symptoms  Discharge Diagnoses:   AXIS I:  Alcohol Dependence, S/P alcohol withdrawal. GAD, Panic Attacks AXIS II:  No diagnosis AXIS III:   Past Medical History  Diagnosis Date  . Seizures   . Bipolar 1 disorder   . Anxiety    AXIS IV:  other psychosocial or environmental problems AXIS V:  61-70 mild symptoms  Plan Of Care/Follow-up recommendations:  Activity:  as tolerated Diet:  regular Follow up [facility] Is patient on multiple antipsychotic therapies at discharge:  No   Has Patient had three or more failed trials of antipsychotic monotherapy by history:  No  Recommended Plan for Multiple Antipsychotic Therapies: NA    Shantrice Rodenberg A 06/21/2014, 11:02 AM

## 2014-06-21 NOTE — Progress Notes (Signed)
[facility] Adult Case Management Discharge Plan :  Will you be returning to the same living situation after discharge: No.  Patient to discharge to [facility]. At discharge, do you have transportation home?:Yes,  [facility] to transport. Do you have the ability to pay for your medications:No.  Patient will be assisted with indigent medications.   Release of information consent forms completed and in the chart;  Patient's signature needed at discharge.  Patient to Follow up at: Follow-up Information   [facility] with [facility] On 06/21/2014. (Friday, June 21, 2014 - Mr. Delford Field to transport patient to program.)    Contact information:   [address] [place], [place]   [address]  [phone]      Follow up with [facility] On 06/25/2014. (Please go to [facility] 's walk in clinic on Tuesday, June 25, 2014 or any weekday between 8AM -5PM for medication management/counseling)    Contact information:   [address] [place], [place]   [address]  [phone]      Patient denies SI/HI:  Patient no longer endorsing SI/HI or other thoughts of self harm.  Safety Planning and Suicide Prevention discussed:  .Reviewed with all patients during discharge planning group   Betti Goodenow, [name] July 06/21/2014, 10:16 AM

## 2014-06-21 NOTE — Progress Notes (Signed)
Patient ID: [name], male   DOB: 1976-06-02, 38 y.o.   MRN: [id number] Patient discharged per physician order; patient denies SI/HI and A/V hallucinations; patient recieved samples, prescriptions, and copy of AVS after it was reviewed; patient had no other questions or concerns at this time; patient verbalized and signed that he received all belongings; patient left the unit ambulatory

## 2014-06-26 NOTE — Progress Notes (Addendum)
Patient Discharge Instructions:  After Visit Summary (AVS):   Faxed to:  06/26/14 Discharge Summary Note:   Faxed to:  06/26/14 Psychiatric Admission Assessment Note:   Faxed to:  06/26/14 Suicide Risk Assessment - Discharge Assessment:   Faxed to:  06/26/14 Faxed/Sent to the Next Level Care provider:  06/26/14 Faxed to [facility] @ [phone] No documentation [facility].  Per [facility] the patient is no longer a participant.  Jerelene ReddenSheena E Cuba, 06/26/2014, 2:05 PM

## 2014-06-27 LAB — CBC
HCT: 45.4 % (ref 40.0–52.0)
HGB: 15.4 g/dL (ref 13.0–18.0)
MCH: 32 pg (ref 26.0–34.0)
MCHC: 34 g/dL (ref 32.0–36.0)
MCV: 94 fL (ref 80–100)
Platelet: 154 10*3/uL (ref 150–440)
RBC: 4.81 10*6/uL (ref 4.40–5.90)
RDW: 13.3 % (ref 11.5–14.5)
WBC: 8.3 10*3/uL (ref 3.8–10.6)

## 2014-06-27 LAB — DRUG SCREEN, URINE

## 2014-06-27 LAB — COMPREHENSIVE METABOLIC PANEL
ALBUMIN: 3.6 g/dL (ref 3.4–5.0)
ALK PHOS: 58 U/L
ALT: 244 U/L — AB
Anion Gap: 4 — ABNORMAL LOW (ref 7–16)
BILIRUBIN TOTAL: 0.5 mg/dL (ref 0.2–1.0)
BUN: 17 mg/dL (ref 7–18)
CO2: 27 mmol/L (ref 21–32)
Calcium, Total: 8.2 mg/dL — ABNORMAL LOW (ref 8.5–10.1)
Chloride: 111 mmol/L — ABNORMAL HIGH (ref 98–107)
Creatinine: 1.1 mg/dL (ref 0.60–1.30)
EGFR (African American): >60
GLUCOSE: 110 mg/dL — AB (ref 65–99)
Osmolality: 285 (ref 275–301)
POTASSIUM: 4.2 mmol/L (ref 3.5–5.1)
SGOT(AST): 78 U/L — ABNORMAL HIGH (ref 15–37)
SODIUM: 142 mmol/L (ref 136–145)
TOTAL PROTEIN: 6.9 g/dL (ref 6.4–8.2)

## 2014-06-27 LAB — SALICYLATE LEVEL

## 2014-06-27 LAB — URINALYSIS, COMPLETE
BACTERIA: NONE SEEN
Bilirubin,UR: NEGATIVE
Glucose,UR: NEGATIVE mg/dL (ref 0–75)
KETONE: NEGATIVE
LEUKOCYTE ESTERASE: NEGATIVE
Nitrite: NEGATIVE
PH: 6 (ref 4.5–8.0)
Protein: NEGATIVE
RBC,UR: <1 /HPF (ref 0–5)
Specific Gravity: 1.009 (ref 1.003–1.030)

## 2014-06-27 LAB — ACETAMINOPHEN LEVEL

## 2014-06-27 LAB — ETHANOL

## 2014-06-28 ENCOUNTER — Inpatient Hospital Stay: Payer: Self-pay | Admitting: Psychiatry

## 2014-08-07 ENCOUNTER — Encounter (HOSPITAL_COMMUNITY): Payer: Self-pay | Admitting: Emergency Medicine

## 2014-08-07 ENCOUNTER — Emergency Department (HOSPITAL_COMMUNITY)
Admission: EM | Admit: 2014-08-07 | Discharge: 2014-08-07 | Disposition: A | Discharge status: Home / Self Care | Payer: Self-pay | Attending: Emergency Medicine | Admitting: Emergency Medicine

## 2014-08-07 DIAGNOSIS — G40909 Epilepsy, unspecified, not intractable, without status epilepticus: Secondary | ICD-10-CM | POA: Insufficient documentation

## 2014-08-07 DIAGNOSIS — Z79899 Other long term (current) drug therapy: Secondary | ICD-10-CM | POA: Insufficient documentation

## 2014-08-07 DIAGNOSIS — F319 Bipolar disorder, unspecified: Secondary | ICD-10-CM | POA: Insufficient documentation

## 2014-08-07 DIAGNOSIS — F419 Anxiety disorder, unspecified: Secondary | ICD-10-CM | POA: Insufficient documentation

## 2014-08-07 DIAGNOSIS — Z720 Tobacco use: Secondary | ICD-10-CM | POA: Insufficient documentation

## 2014-08-07 MED ORDER — QUETIAPINE FUMARATE 50 MG PO TABS
50.0000 mg | ORAL_TABLET | Freq: Every day | ORAL | Status: DC
  Administered 2014-08-07: 50 mg via ORAL
  Filled 2014-08-07: qty 1

## 2014-08-07 MED ORDER — LORAZEPAM 1 MG PO TABS
1.0000 mg | ORAL_TABLET | Freq: Once | ORAL | Status: AC
  Administered 2014-08-07: 1 mg via ORAL
  Filled 2014-08-07: qty 1

## 2014-08-07 NOTE — Discharge Instructions (Signed)
Panic Attacks Panic attacks are sudden, short-livedsurges of severe anxiety, fear, or discomfort. They may occur for no reason when you are relaxed, when you are anxious, or when you are sleeping. Panic attacks may occur for a number of reasons:   Healthy people occasionally have panic attacks in extreme, life-threatening situations, such as war or natural disasters. Normal anxiety is a protective mechanism of the body that helps us react to danger (fight or flight response).  Panic attacks are often seen with anxiety disorders, such as panic disorder, social anxiety disorder, generalized anxiety disorder, and phobias. Anxiety disorders cause excessive or uncontrollable anxiety. They may interfere with your relationships or other life activities.  Panic attacks are sometimes seen with other mental illnesses, such as depression and posttraumatic stress disorder.  Certain medical conditions, prescription medicines, and drugs of abuse can cause panic attacks. SYMPTOMS  Panic attacks start suddenly, peak within 20 minutes, and are accompanied by four or more of the following symptoms:  Pounding heart or fast heart rate (palpitations).  Sweating.  Trembling or shaking.  Shortness of breath or feeling smothered.  Feeling choked.  Chest pain or discomfort.  Nausea or strange feeling in your stomach.  Dizziness, light-headedness, or feeling like you will faint.  Chills or hot flushes.  Numbness or tingling in your lips or hands and feet.  Feeling that things are not real or feeling that you are not yourself.  Fear of losing control or going crazy.  Fear of dying. Some of these symptoms can mimic serious medical conditions. For example, you may think you are having a heart attack. Although panic attacks can be very scary, they are not life threatening. DIAGNOSIS  Panic attacks are diagnosed through an assessment by your health care provider. Your health care provider will ask  questions about your symptoms, such as where and when they occurred. Your health care provider will also ask about your medical history and use of alcohol and drugs, including prescription medicines. Your health care provider may order blood tests or other studies to rule out a serious medical condition. Your health care provider may refer you to a mental health professional for further evaluation. TREATMENT   Most healthy people who have one or two panic attacks in an extreme, life-threatening situation will not require treatment.  The treatment for panic attacks associated with anxiety disorders or other mental illness typically involves counseling with a mental health professional, medicine, or a combination of both. Your health care provider will help determine what treatment is best for you.  Panic attacks due to physical illness usually go away with treatment of the illness. If prescription medicine is causing panic attacks, talk with your health care provider about stopping the medicine, decreasing the dose, or substituting another medicine.  Panic attacks due to alcohol or drug abuse go away with abstinence. Some adults need professional help in order to stop drinking or using drugs. HOME CARE INSTRUCTIONS   Take all medicines as directed by your health care provider.   Schedule and attend follow-up visits as directed by your health care provider. It is important to keep all your appointments. SEEK MEDICAL CARE IF:  You are not able to take your medicines as prescribed.  Your symptoms do not improve or get worse. SEEK IMMEDIATE MEDICAL CARE IF:   You experience panic attack symptoms that are different than your usual symptoms.  You have serious thoughts about hurting yourself or others.  You are taking medicine for panic attacks and   have a serious side effect. MAKE SURE YOU:  Understand these instructions.  Will watch your condition.  Will get help right away if you are not  doing well or get worse. Document Released: 09/13/2005 Document Revised: 09/18/2013 Document Reviewed: 04/27/2013 ExitCare Patient Information 2015 [employer]. This information is not intended to replace advice given to you by your health care provider. Make sure you discuss any questions you have with your health care provider.  Generalized Anxiety Disorder Generalized anxiety disorder (GAD) is a mental disorder. It interferes with life functions, including relationships, work, and school. GAD is different from normal anxiety, which everyone experiences at some point in their lives in response to specific life events and activities. Normal anxiety actually helps us prepare for and get through these life events and activities. Normal anxiety goes away after the event or activity is over.  GAD causes anxiety that is not necessarily related to specific events or activities. It also causes excess anxiety in proportion to specific events or activities. The anxiety associated with GAD is also difficult to control. GAD can vary from mild to severe. People with severe GAD can have intense waves of anxiety with physical symptoms (panic attacks).  SYMPTOMS The anxiety and worry associated with GAD are difficult to control. This anxiety and worry are related to many life events and activities and also occur more days than not for 6 months or longer. People with GAD also have three or more of the following symptoms (one or more in children):  Restlessness.   Fatigue.  Difficulty concentrating.   Irritability.  Muscle tension.  Difficulty sleeping or unsatisfying sleep. DIAGNOSIS GAD is diagnosed through an assessment by your health care provider. Your health care provider will ask you questions aboutyour mood,physical symptoms, and events in your life. Your health care provider may ask you about your medical history and use of alcohol or drugs, including prescription medicines. Your health care  provider may also do a physical exam and blood tests. Certain medical conditions and the use of certain substances can cause symptoms similar to those associated with GAD. Your health care provider may refer you to a mental health specialist for further evaluation. TREATMENT The following therapies are usually used to treat GAD:   Medication. Antidepressant medication usually is prescribed for long-term daily control. Antianxiety medicines may be added in severe cases, especially when panic attacks occur.   Talk therapy (psychotherapy). Certain types of talk therapy can be helpful in treating GAD by providing support, education, and guidance. A form of talk therapy called cognitive behavioral therapy can teach you healthy ways to think about and react to daily life events and activities.  Stress managementtechniques. These include yoga, meditation, and exercise and can be very helpful when they are practiced regularly. A mental health specialist can help determine which treatment is best for you. Some people see improvement with one therapy. However, other people require a combination of therapies. Document Released: 01/08/2013 Document Revised: 01/28/2014 Document Reviewed: 01/08/2013 ExitCare Patient Information 2015 [employer]. This information is not intended to replace advice given to you by your health care provider. Make sure you discuss any questions you have with your health care provider.  

## 2014-08-07 NOTE — ED Notes (Signed)
Pt awaiting case management consult for assistance with medication, aware and agreeable with plan.
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
## 2014-08-07 NOTE — ED Notes (Signed)
Pt states that he has been without his anxiolytic medications x 6 days.  States that he went to [facility] today and got new prescriptions.  States that he went to the bank to get money for his medicine and it was closed because it is veterans day.  States that this caused him to feel more anxiety.  Pt is a recovering alcoholic and is afraid that he will start drinking if he doesn't calm down.  Pt denies SI/HI, drugs or etoh.

## 2014-08-07 NOTE — Progress Notes (Signed)
ED CM Received call from [facility] ED triage PA/NP inquire about medication assistance for this [facility] self pay pt.   CM discussed [facility] program assistance provides only discounted cost for medication but does not offer co pays for medications.  There is not a [facility] program that provides medications without any co pay or just funds for co pay.  ED PA provided pt with medications prior to leaving [facility] ED.  Pt reported going to [place] today and receiving Rx he can not fill because he is unable to get money from his bank and has no access to atm per bank at this time.   Cm called to explain program to pt, to offer financial resources The number [phone] does not have a mail box setup to take messages, on [phone] CM left a voice message requesting a return call (this number states it is a number for a male - PA/NP states a male came with pt to [facility] ED) and at [phone]) there was no answer ED PA updated Pending return call from pt from [phone] voice message left

## 2014-08-09 ENCOUNTER — Encounter (HOSPITAL_COMMUNITY): Payer: Self-pay | Admitting: Emergency Medicine

## 2014-08-09 ENCOUNTER — Emergency Department (HOSPITAL_COMMUNITY)
Admission: EM | Admit: 2014-08-09 | Discharge: 2014-08-09 | Disposition: A | Discharge status: Home / Self Care | Payer: Self-pay | Attending: Emergency Medicine | Admitting: Emergency Medicine

## 2014-08-09 DIAGNOSIS — G40909 Epilepsy, unspecified, not intractable, without status epilepticus: Secondary | ICD-10-CM | POA: Insufficient documentation

## 2014-08-09 DIAGNOSIS — Z720 Tobacco use: Secondary | ICD-10-CM | POA: Insufficient documentation

## 2014-08-09 DIAGNOSIS — F319 Bipolar disorder, unspecified: Secondary | ICD-10-CM | POA: Insufficient documentation

## 2014-08-09 DIAGNOSIS — F419 Anxiety disorder, unspecified: Secondary | ICD-10-CM | POA: Insufficient documentation

## 2014-08-09 DIAGNOSIS — Z79899 Other long term (current) drug therapy: Secondary | ICD-10-CM | POA: Insufficient documentation

## 2014-08-09 MED ORDER — HYDROXYZINE HCL 25 MG PO TABS: 25.0000 mg | ORAL_TABLET | Freq: Three times a day (TID) | ORAL | Status: DC | PRN

## 2014-08-09 MED ORDER — LORAZEPAM 1 MG PO TABS
1.0000 mg | ORAL_TABLET | Freq: Once | ORAL | Status: AC
  Administered 2014-08-09: 1 mg via ORAL
  Filled 2014-08-09: qty 1

## 2014-08-09 NOTE — Discharge Instructions (Signed)
Please follow up closely with your doctor for medication refill.  Take Vistaril as needed for your anxiety.   Social Anxiety Disorder Social anxiety disorder, previously called social phobia, is a mental disorder. People with social anxiety disorder frequently feel nervous, afraid, or embarrassed when around other people in social situations. They constantly worry that other people are judging or criticizing them for how they look, what they say, or how they act. They may worry that other people might reject them because of their appearance or behavior. Social anxiety disorder is more than just occasional shyness or self-consciousness. It can cause severe emotional distress. It can interfere with daily life activities. Social anxiety disorder also may lead to excessive alcohol or drug use and even suicide.  Social anxiety disorder is actually one of the most common mental disorders. It can develop at any time but usually starts in the teenage years. Women are more commonly affected than men. Social anxiety disorder is also more common in people who have family members with anxiety disorders. It also is more common in people who have physical deformities or conditions with characteristics that are obvious to others, such as stuttered speech or movement abnormalities (Parkinson disease).  SYMPTOMS  In addition to feeling anxious or fearful in social situations, people with social anxiety disorder frequently have physical symptoms. Examples include:  Red face (blushing).  Racing heart.  Sweating.  Shaky hands or voice.  Confusion.  Light-headedness.  Upset stomach and diarrhea. DIAGNOSIS  Social anxiety disorder is diagnosed through an assessment by your health care provider. Your health care provider will ask you questions about your mood, thoughts, and reactions in social situations. Your health care provider may ask you about your medical history and use of alcohol or drugs, including  prescription medicines. Certain medical conditions and the use of certain substances, including caffeine, can cause symptoms similar to social anxiety disorder. Your health care provider may refer you to a mental health specialist for further evaluation or treatment. The criteria for diagnosis of social anxiety disorder are:  Marked fear or anxiety in one or more social situations in which you may be closely watched or studied by others. Examples of such situations include:  Interacting socially (having a conversation with others, going to a party, or meeting strangers).  Being observed (eating or drinking in public or being called on in class).  Performing in front of others (giving a speech).  The social situations of concern almost always cause fear or anxiety, not just occasionally.  People with social anxiety disorder fear that they will be viewed negatively in a way that will be embarrassing, will lead to rejection, or will offend others. This fear is out of proportion to the actual threat posed by the social situation.  Often the triggering social situations are avoided, or they are endured with intense fear or anxiety. The fear, anxiety, or avoidance is persistent and lasts for 6 months or longer.  The anxiety causes difficulty functioning in at least some parts of your daily life. TREATMENT  Several types of treatment are available for social anxiety disorder. These treatments are often used in combination and include:   Talk therapy. Group talk therapy allows you to see that you are not alone with these problems. Individual talk therapy helps you address your specific anxiety issues with a caring professional. The most effective forms of talk therapy for social anxiety disorder are cognitive-behavioral therapy and exposure therapy. Cognitive-behavioral therapy helps you to identify and change negative  thoughts and beliefs that are at the root of the disorder. Exposure therapy allows  you to gradually face the situations that you fear most.  Relaxation and coping techniques. These include deep breathing, self-talk, meditation, visual imagery, and yoga. Relaxation techniques help to keep you calm in social situations.  Social Optician, dispensingskills training.Social skills can be learned on your own or with the help of a talk therapist. They can help you feel more confident and comfortable in social situations.  Medicine. For anxiety limited to performance situations (performance anxiety), medicine called beta blockers can help by reducing or preventing the physical symptoms of social anxiety disorder. For more persistent and generalized social anxiety, antidepressant medicine may be prescribed to help control symptoms. In severe cases of social anxiety disorder, strong antianxiety medicine, called benzodiazepines, may be prescribed on a limited basis and for a short time. Document Released: 08/12/2005 Document Revised: 01/28/2014 Document Reviewed: 12/12/2012 [facility] Patient Information 2015 [place], [place]. This information is not intended to replace advice given to you by your health care provider. Make sure you discuss any questions you have with your health care provider.

## 2014-08-09 NOTE — ED Notes (Signed)
Pt alert, nad, resp even unlabored, skin pwd, denies needs, ambulates to discharge 
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
## 2014-08-09 NOTE — ED Notes (Signed)
Pt alert, arrives from home, c/o anxiety, seen in ED several times recently with similar c/o, states unable to have [facility] provide medication he feels that is needed, denies SI/NI, feels frustrated, resp even unlabored, skin pwd

## 2014-08-25 ENCOUNTER — Emergency Department (HOSPITAL_COMMUNITY)
Admission: EM | Admit: 2014-08-25 | Discharge: 2014-08-26 | Disposition: A | Discharge status: Other Acute Inpatient Hospital | Payer: Federal, State, Local not specified - Other | Attending: Emergency Medicine | Admitting: Emergency Medicine

## 2014-08-25 ENCOUNTER — Encounter (HOSPITAL_COMMUNITY): Payer: Self-pay | Admitting: Emergency Medicine

## 2014-08-25 DIAGNOSIS — Z79899 Other long term (current) drug therapy: Secondary | ICD-10-CM | POA: Insufficient documentation

## 2014-08-25 DIAGNOSIS — Z720 Tobacco use: Secondary | ICD-10-CM | POA: Insufficient documentation

## 2014-08-25 DIAGNOSIS — G40909 Epilepsy, unspecified, not intractable, without status epilepticus: Secondary | ICD-10-CM | POA: Insufficient documentation

## 2014-08-25 DIAGNOSIS — F411 Generalized anxiety disorder: Secondary | ICD-10-CM

## 2014-08-25 DIAGNOSIS — R451 Restlessness and agitation: Secondary | ICD-10-CM

## 2014-08-25 DIAGNOSIS — F410 Panic disorder [episodic paroxysmal anxiety] without agoraphobia: Secondary | ICD-10-CM

## 2014-08-25 LAB — COMPREHENSIVE METABOLIC PANEL
ALT: 145 U/L — AB (ref 0–53)
AST: 56 U/L — ABNORMAL HIGH (ref 0–37)
Albumin: 4.2 g/dL (ref 3.5–5.2)
Alkaline Phosphatase: 66 U/L (ref 39–117)
Anion gap: 14 (ref 5–15)
BUN: 13 mg/dL (ref 6–23)
CALCIUM: 9.4 mg/dL (ref 8.4–10.5)
CO2: 26 meq/L (ref 19–32)
Chloride: 100 mEq/L (ref 96–112)
Creatinine, Ser: 0.86 mg/dL (ref 0.50–1.35)
Glucose, Bld: 89 mg/dL (ref 70–99)
Potassium: 4.2 mEq/L (ref 3.7–5.3)
SODIUM: 140 meq/L (ref 137–147)
Total Bilirubin: 0.5 mg/dL (ref 0.3–1.2)
Total Protein: 8 g/dL (ref 6.0–8.3)

## 2014-08-25 LAB — CBC WITH DIFFERENTIAL/PLATELET
Basophils Absolute: 0 10*3/uL (ref 0.0–0.1)
Basophils Relative: 0 % (ref 0–1)
EOS PCT: 6 % — AB (ref 0–5)
Eosinophils Absolute: 0.7 10*3/uL (ref 0.0–0.7)
HCT: 47.5 % (ref 39.0–52.0)
Hemoglobin: 17 g/dL (ref 13.0–17.0)
Lymphocytes Relative: 29 % (ref 12–46)
Lymphs Abs: 3.4 10*3/uL (ref 0.7–4.0)
MCH: 32.4 pg (ref 26.0–34.0)
MCHC: 35.8 g/dL (ref 30.0–36.0)
MCV: 90.6 fL (ref 78.0–100.0)
Monocytes Absolute: 0.7 10*3/uL (ref 0.1–1.0)
Monocytes Relative: 6 % (ref 3–12)
Neutro Abs: 6.8 10*3/uL (ref 1.7–7.7)
Neutrophils Relative %: 59 % (ref 43–77)
PLATELETS: 218 10*3/uL (ref 150–400)
RBC: 5.24 MIL/uL (ref 4.22–5.81)
RDW: 12.4 % (ref 11.5–15.5)
WBC: 11.6 10*3/uL — ABNORMAL HIGH (ref 4.0–10.5)

## 2014-08-25 LAB — RAPID URINE DRUG SCREEN, HOSP PERFORMED
Amphetamines: NOT DETECTED
BARBITURATES: NOT DETECTED
BENZODIAZEPINES: NOT DETECTED
Cocaine: NOT DETECTED
Opiates: NOT DETECTED
Tetrahydrocannabinol: NOT DETECTED

## 2014-08-25 LAB — ETHANOL: Alcohol, Ethyl (B): <11 mg/dL (ref 0–11)

## 2014-08-25 MED ORDER — QUETIAPINE FUMARATE 25 MG PO TABS
25.0000 mg | ORAL_TABLET | Freq: Three times a day (TID) | ORAL | Status: DC
  Administered 2014-08-25 – 2014-08-26 (×3): 25 mg via ORAL
  Filled 2014-08-25 (×3): qty 1

## 2014-08-25 MED ORDER — MECLIZINE HCL 25 MG PO TABS
50.0000 mg | ORAL_TABLET | Freq: Once | ORAL | Status: DC
  Filled 2014-08-25: qty 2

## 2014-08-25 MED ORDER — TRAZODONE HCL 100 MG PO TABS
100.0000 mg | ORAL_TABLET | Freq: Every evening | ORAL | Status: DC | PRN
  Administered 2014-08-25: 100 mg via ORAL
  Filled 2014-08-25: qty 1

## 2014-08-25 MED ORDER — GABAPENTIN 300 MG PO CAPS
300.0000 mg | ORAL_CAPSULE | Freq: Three times a day (TID) | ORAL | Status: DC
  Administered 2014-08-25 – 2014-08-26 (×3): 300 mg via ORAL
  Filled 2014-08-25 (×3): qty 1

## 2014-08-25 MED ORDER — NICOTINE 21 MG/24HR TD PT24
21.0000 mg | MEDICATED_PATCH | Freq: Once | TRANSDERMAL | Status: DC
  Administered 2014-08-25: 21 mg via TRANSDERMAL
  Filled 2014-08-25: qty 1

## 2014-08-25 MED ORDER — HYDROXYZINE HCL 25 MG PO TABS
50.0000 mg | ORAL_TABLET | Freq: Once | ORAL | Status: AC
  Administered 2014-08-25: 50 mg via ORAL
  Filled 2014-08-25: qty 2

## 2014-08-25 NOTE — ED Provider Notes (Signed)
2035 - Notified by TTS that patient needs to be evaluated by psychiatrist in AM. Psych hold orders placed by prior provider.  Antony MaduraKelly Aleenah Homen, PA-C 08/25/14 2036

## 2014-08-25 NOTE — ED Notes (Signed)
Pt from home c/o panic attacks. Pt reports he called his psychiatrist and was told to wait until his next appointment. Denies SI or HI.

## 2014-08-25 NOTE — ED Notes (Signed)
Bed: [facility] Expected date:  Expected time:  Means of arrival:  Comments: 
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
## 2014-08-25 NOTE — BH Assessment (Signed)
Assessment completed. Consulted Janann Augustori Burkett, NP who recommended that pt be re-evaluated in the am. Antony MaduraKelly Humes, PA-C has been informed of the recommendation.
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
## 2014-08-26 ENCOUNTER — Encounter (HOSPITAL_COMMUNITY): Payer: Self-pay

## 2014-08-26 ENCOUNTER — Observation Stay (HOSPITAL_COMMUNITY)
Admission: AD | Admit: 2014-08-26 | Discharge: 2014-08-27 | Disposition: A | Discharge status: Home / Self Care | Payer: Self-pay | Source: Intra-hospital | Attending: Psychiatry | Admitting: Psychiatry

## 2014-08-26 ENCOUNTER — Encounter (HOSPITAL_COMMUNITY): Payer: Self-pay | Admitting: Psychiatry

## 2014-08-26 DIAGNOSIS — G40909 Epilepsy, unspecified, not intractable, without status epilepticus: Secondary | ICD-10-CM | POA: Insufficient documentation

## 2014-08-26 DIAGNOSIS — F410 Panic disorder [episodic paroxysmal anxiety] without agoraphobia: Secondary | ICD-10-CM

## 2014-08-26 DIAGNOSIS — F1020 Alcohol dependence, uncomplicated: Secondary | ICD-10-CM | POA: Insufficient documentation

## 2014-08-26 DIAGNOSIS — Z888 Allergy status to other drugs, medicaments and biological substances status: Secondary | ICD-10-CM | POA: Insufficient documentation

## 2014-08-26 DIAGNOSIS — R451 Restlessness and agitation: Secondary | ICD-10-CM | POA: Insufficient documentation

## 2014-08-26 DIAGNOSIS — F319 Bipolar disorder, unspecified: Secondary | ICD-10-CM | POA: Insufficient documentation

## 2014-08-26 DIAGNOSIS — F411 Generalized anxiety disorder: Secondary | ICD-10-CM

## 2014-08-26 DIAGNOSIS — Z886 Allergy status to analgesic agent status: Secondary | ICD-10-CM | POA: Insufficient documentation

## 2014-08-26 DIAGNOSIS — Z720 Tobacco use: Secondary | ICD-10-CM | POA: Insufficient documentation

## 2014-08-26 DIAGNOSIS — Z79899 Other long term (current) drug therapy: Secondary | ICD-10-CM | POA: Insufficient documentation

## 2014-08-26 MED ORDER — IBUPROFEN 800 MG PO TABS
800.0000 mg | ORAL_TABLET | Freq: Once | ORAL | Status: AC
  Administered 2014-08-26: 800 mg via ORAL
  Filled 2014-08-26: qty 1

## 2014-08-26 MED ORDER — ZIPRASIDONE MESYLATE 20 MG IM SOLR
20.0000 mg | Freq: Once | INTRAMUSCULAR | Status: DC
  Filled 2014-08-26: qty 20

## 2014-08-26 MED ORDER — NICOTINE 21 MG/24HR TD PT24
21.0000 mg | MEDICATED_PATCH | Freq: Once | TRANSDERMAL | Status: DC
  Administered 2014-08-27: 21 mg via TRANSDERMAL
  Filled 2014-08-26 (×2): qty 1

## 2014-08-26 MED ORDER — QUETIAPINE FUMARATE 50 MG PO TABS
50.0000 mg | ORAL_TABLET | Freq: Three times a day (TID) | ORAL | Status: DC
  Administered 2014-08-27 (×2): 50 mg via ORAL
  Filled 2014-08-26 (×6): qty 1

## 2014-08-26 MED ORDER — GABAPENTIN 300 MG PO CAPS
300.0000 mg | ORAL_CAPSULE | Freq: Four times a day (QID) | ORAL | Status: DC
  Administered 2014-08-26 – 2014-08-27 (×3): 300 mg via ORAL
  Filled 2014-08-26 (×9): qty 1

## 2014-08-26 MED ORDER — HYDROXYZINE HCL 50 MG PO TABS
50.0000 mg | ORAL_TABLET | Freq: Four times a day (QID) | ORAL | Status: DC | PRN
  Administered 2014-08-27: 50 mg via ORAL
  Filled 2014-08-26: qty 1

## 2014-08-26 MED ORDER — STERILE WATER FOR INJECTION IJ SOLN
INTRAMUSCULAR | Status: AC
  Filled 2014-08-26: qty 10

## 2014-08-26 MED ORDER — QUETIAPINE FUMARATE 100 MG PO TABS: 100.0000 mg | ORAL_TABLET | Freq: Every day | ORAL | Status: DC

## 2014-08-26 MED ORDER — DIPHENHYDRAMINE HCL 50 MG/ML IJ SOLN
50.0000 mg | Freq: Once | INTRAMUSCULAR | Status: DC
  Filled 2014-08-26: qty 1

## 2014-08-26 MED ORDER — HYDROXYZINE HCL 25 MG PO TABS
50.0000 mg | ORAL_TABLET | Freq: Four times a day (QID) | ORAL | Status: DC | PRN
  Administered 2014-08-26 (×2): 50 mg via ORAL
  Filled 2014-08-26 (×2): qty 2

## 2014-08-26 MED ORDER — QUETIAPINE FUMARATE 100 MG PO TABS
100.0000 mg | ORAL_TABLET | Freq: Every day | ORAL | Status: DC
  Administered 2014-08-26: 100 mg via ORAL
  Filled 2014-08-26 (×3): qty 1

## 2014-08-26 MED ORDER — LORAZEPAM 1 MG PO TABS
1.0000 mg | ORAL_TABLET | Freq: Once | ORAL | Status: DC
  Filled 2014-08-26: qty 1

## 2014-08-26 MED ORDER — QUETIAPINE FUMARATE 50 MG PO TABS
50.0000 mg | ORAL_TABLET | Freq: Three times a day (TID) | ORAL | Status: DC
  Administered 2014-08-26: 50 mg via ORAL
  Filled 2014-08-26: qty 1

## 2014-08-26 MED ORDER — GABAPENTIN 300 MG PO CAPS: 300.0000 mg | ORAL_CAPSULE | Freq: Four times a day (QID) | ORAL | Status: DC

## 2014-08-26 NOTE — Progress Notes (Signed)
Patient ID: Steven RavelJason M [name], male   DOB: 10-Jul-1976, 38 y.o.   MRN: [id number] Pt denies SI/HI/AVH.  Pt states increased anxiety and panic attacks over the past 2 weeks.  He reports constant anxiety throughout the course of the day with daily panic attacks.  This is causing him to become frustrated and moody.  Pt cannot identify the cause of his increased anxiety.  He states it could possibly be that his medication is not working.  Pt is a recovering alcoholic.  His last drink was 7 months ago.  He reports attending AA on a daily basis.  He states he decided to come to seek help for his anxiety because he did not want to relapse on ETOH or "snap off" on someone.  Pt currently lives with his girlfriend.  He does have 2 sons.

## 2014-08-26 NOTE — ED Notes (Signed)
Patient resting quietly with eyes closed. Appears in no respiratory distress. Respirations even, regular, and unlabored.  

## 2014-08-26 NOTE — ED Notes (Signed)
Psychiatric team with patient, will administer Ativan when psychiatric team finishes interview.

## 2014-08-26 NOTE — ED Notes (Signed)
[facility] Expected date:  Expected time:  Means of arrival:  Comments: HAL A

## 2014-08-26 NOTE — Progress Notes (Signed)
[facility] INPATIENT:  Family/Significant Other Suicide Prevention Education  Suicide Prevention Education:  Patient Refusal for Family/Significant Other Suicide Prevention Education: The patient [name] has refused to provide written consent for family/significant other to be provided Family/Significant Other Suicide Prevention Education during admission and/or prior to discharge.  Physician notified.  [name], Steven Fischel [facility] 08/26/2014, 7:45 PM

## 2014-08-26 NOTE — ED Notes (Signed)
[facility]

## 2014-08-26 NOTE — ED Notes (Signed)
[facility] Expected date:  Expected time:  Means of arrival:  Comments: RM 5
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
## 2014-08-26 NOTE — ED Notes (Signed)
Patient reports that he has been really anxious lately and has had some mood changes recently. Patient reports that he is going to intensive outpatient and trying to attend meetings to stay clean. Hx of seizures and bipolar disorder. Liver functions high. Girlfriend coming back to visit.

## 2014-08-26 NOTE — Discharge Instructions (Signed)
[facility]

## 2014-08-26 NOTE — Plan of Care (Signed)
[facility] Observation Crisis Plan  Reason for Crisis Plan:  Crisis Stabilization   Plan of Care:  Referral for Telepsychiatry/Psychiatric Consult  Family Support:      Current Living Environment:  Living Arrangements: Spouse/significant other  Insurance:   Hospital Account    Name Acct ID Class Status Primary Coverage   [name], [name] BEHAVIORAL HEALTH OBSERVATION Open None        Guarantor Account (for Hospital Account [id number])    Name Relation to Pt Service Area Active? Acct Type   [name], [name] Self [facility] Yes Behavioral Health   Address Phone       [address] [place], [place] [address] [phone](H)          Coverage Information (for Hospital Account [id number])    Not on file      Legal Guardian:     Primary Care Provider:  Sissy HoffSWAYNE,DAVID W, MD  Current Outpatient Providers:  [facility]  Psychiatrist:     Counselor/Therapist:     Compliant with Medications:  Yes  Additional Information:   [name], [name] 11/30/20157:46 PM

## 2014-08-26 NOTE — ED Notes (Signed)
Patient resting on his left side with eyes closed. Appears in no respiratory distress. Observed rise and fall of chest. Respirations regular, even and unlabored.

## 2014-08-26 NOTE — ED Notes (Signed)
Provided patient a pillow, patient is resting quietly with eyes closed when entering the room. Appears in no respiratory distress.

## 2014-08-26 NOTE — ED Notes (Signed)
Bed: WA05 Expected date:  Expected time:  Means of arrival:  Comments: 

## 2014-08-27 DIAGNOSIS — F410 Panic disorder [episodic paroxysmal anxiety] without agoraphobia: Secondary | ICD-10-CM

## 2014-08-27 DIAGNOSIS — F411 Generalized anxiety disorder: Secondary | ICD-10-CM

## 2014-08-27 MED ORDER — QUETIAPINE FUMARATE 50 MG PO TABS: 50.0000 mg | ORAL_TABLET | Freq: Three times a day (TID) | ORAL | Status: DC

## 2014-08-27 MED ORDER — HYDROXYZINE HCL 50 MG PO TABS: 50.0000 mg | ORAL_TABLET | Freq: Four times a day (QID) | ORAL | Status: DC | PRN

## 2014-08-27 MED ORDER — GABAPENTIN 300 MG PO CAPS: 300.0000 mg | ORAL_CAPSULE | Freq: Four times a day (QID) | ORAL | Status: DC

## 2014-08-27 MED ORDER — QUETIAPINE FUMARATE 100 MG PO TABS: 100.0000 mg | ORAL_TABLET | Freq: Every day | ORAL | Status: DC
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
## 2014-08-27 NOTE — Progress Notes (Signed)
Patient ID: Steven RavelJason M Canner, male   DOB: 09/21/1976, 38 y.o.   MRN: [id number] Patient discharged per MD orders. Patient given education regarding follow-up appointments and medications. Patient denies any questions or concerns about these instructions. Patient was escorted to locker and given belongings before discharge to hospital lobby. Patient currently denies SI/HI and auditory and visual hallucinations on discharge.
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
## 2014-08-27 NOTE — BH Assessment (Signed)
Writer consulted with the NP Heloise Purpura regarding the disposition of the patient.  Writer met with the patient.  Patient denies SI/HI/Psychosis and has established services with [facility].  Patient reports that he was experiencing panic attack but they have decreased.    Per Heloise Purpura the patient will be discharged back to his current provider.

## 2014-08-27 NOTE — Progress Notes (Signed)
Patient ID: Steven RavelJason M [name], male   DOB: 11-Aug-1976, 38 y.o.   MRN: [id number] Patient denies HI,SI,AVH.  States he is anxious and is here for med adjustment due to panic attacks and increased agitation.States his last drink was 7 months ago. Has been eating well and resting. Plans to discharge home. Pleasant and cooperative. Will continue to monitor for safety.

## 2014-11-27 ENCOUNTER — Emergency Department (HOSPITAL_COMMUNITY)
Admission: EM | Admit: 2014-11-27 | Discharge: 2014-11-27 | Disposition: A | Discharge status: Home / Self Care | Payer: Self-pay | Attending: Emergency Medicine | Admitting: Emergency Medicine

## 2014-11-27 ENCOUNTER — Encounter (HOSPITAL_COMMUNITY): Payer: Self-pay | Admitting: *Deleted

## 2014-11-27 DIAGNOSIS — F319 Bipolar disorder, unspecified: Secondary | ICD-10-CM | POA: Insufficient documentation

## 2014-11-27 DIAGNOSIS — Z720 Tobacco use: Secondary | ICD-10-CM | POA: Insufficient documentation

## 2014-11-27 DIAGNOSIS — R079 Chest pain, unspecified: Secondary | ICD-10-CM | POA: Insufficient documentation

## 2014-11-27 DIAGNOSIS — G40909 Epilepsy, unspecified, not intractable, without status epilepticus: Secondary | ICD-10-CM | POA: Insufficient documentation

## 2014-11-27 DIAGNOSIS — Z79899 Other long term (current) drug therapy: Secondary | ICD-10-CM | POA: Insufficient documentation

## 2014-11-27 DIAGNOSIS — F419 Anxiety disorder, unspecified: Secondary | ICD-10-CM | POA: Insufficient documentation

## 2014-11-27 MED ORDER — LORAZEPAM 1 MG PO TABS: 1.0000 mg | ORAL_TABLET | Freq: Three times a day (TID) | ORAL | Status: DC | PRN

## 2014-11-27 MED ORDER — LORAZEPAM 1 MG PO TABS
1.0000 mg | ORAL_TABLET | Freq: Once | ORAL | Status: AC
  Administered 2014-11-27: 1 mg via ORAL
  Filled 2014-11-27: qty 1
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
## 2014-11-27 NOTE — ED Notes (Signed)
Pt in stating he has had increased anxiety and panic attacks recently, unable to get to his doctor to get his medication adjusted, no distress noted

## 2014-11-27 NOTE — Discharge Instructions (Signed)
Take the prescribed medication as directed. Follow-up with your psychiatrist next week as scheduled. Return to the ED for new or worsening symptoms, specifically if you develop suicidal or homicidal ideation, auditory or visual hallucinations.

## 2014-12-10 ENCOUNTER — Encounter (HOSPITAL_COMMUNITY): Payer: Self-pay

## 2014-12-10 ENCOUNTER — Emergency Department (HOSPITAL_COMMUNITY)
Admission: EM | Admit: 2014-12-10 | Discharge: 2014-12-10 | Disposition: A | Discharge status: Home / Self Care | Payer: Self-pay | Attending: Emergency Medicine | Admitting: Emergency Medicine

## 2014-12-10 ENCOUNTER — Emergency Department (HOSPITAL_COMMUNITY): Payer: Self-pay

## 2014-12-10 DIAGNOSIS — Y998 Other external cause status: Secondary | ICD-10-CM | POA: Insufficient documentation

## 2014-12-10 DIAGNOSIS — Y92410 Unspecified street and highway as the place of occurrence of the external cause: Secondary | ICD-10-CM | POA: Insufficient documentation

## 2014-12-10 DIAGNOSIS — R569 Unspecified convulsions: Secondary | ICD-10-CM

## 2014-12-10 DIAGNOSIS — G40909 Epilepsy, unspecified, not intractable, without status epilepticus: Secondary | ICD-10-CM | POA: Insufficient documentation

## 2014-12-10 DIAGNOSIS — Z720 Tobacco use: Secondary | ICD-10-CM | POA: Insufficient documentation

## 2014-12-10 DIAGNOSIS — F319 Bipolar disorder, unspecified: Secondary | ICD-10-CM | POA: Insufficient documentation

## 2014-12-10 DIAGNOSIS — Y9389 Activity, other specified: Secondary | ICD-10-CM | POA: Insufficient documentation

## 2014-12-10 DIAGNOSIS — Z79899 Other long term (current) drug therapy: Secondary | ICD-10-CM | POA: Insufficient documentation

## 2014-12-10 DIAGNOSIS — S0990XA Unspecified injury of head, initial encounter: Secondary | ICD-10-CM | POA: Insufficient documentation

## 2014-12-10 DIAGNOSIS — F419 Anxiety disorder, unspecified: Secondary | ICD-10-CM | POA: Insufficient documentation

## 2014-12-10 LAB — RAPID URINE DRUG SCREEN, HOSP PERFORMED
AMPHETAMINES: NOT DETECTED
BENZODIAZEPINES: NOT DETECTED
Barbiturates: NOT DETECTED
COCAINE: NOT DETECTED
Opiates: NOT DETECTED
Tetrahydrocannabinol: NOT DETECTED

## 2014-12-10 LAB — CBC WITH DIFFERENTIAL/PLATELET
Basophils Absolute: 0 10*3/uL (ref 0.0–0.1)
Basophils Relative: 0 % (ref 0–1)
EOS ABS: 0.2 10*3/uL (ref 0.0–0.7)
Eosinophils Relative: 2 % (ref 0–5)
HCT: 41.9 % (ref 39.0–52.0)
HEMOGLOBIN: 14.6 g/dL (ref 13.0–17.0)
LYMPHS ABS: 1.8 10*3/uL (ref 0.7–4.0)
Lymphocytes Relative: 13 % (ref 12–46)
MCH: 30 pg (ref 26.0–34.0)
MCHC: 34.8 g/dL (ref 30.0–36.0)
MCV: 86.2 fL (ref 78.0–100.0)
MONO ABS: 0.8 10*3/uL (ref 0.1–1.0)
MONOS PCT: 6 % (ref 3–12)
Neutro Abs: 10.6 10*3/uL — ABNORMAL HIGH (ref 1.7–7.7)
Neutrophils Relative %: 79 % — ABNORMAL HIGH (ref 43–77)
Platelets: 184 10*3/uL (ref 150–400)
RBC: 4.86 MIL/uL (ref 4.22–5.81)
RDW: 12.6 % (ref 11.5–15.5)
WBC: 13.4 10*3/uL — ABNORMAL HIGH (ref 4.0–10.5)

## 2014-12-10 LAB — COMPREHENSIVE METABOLIC PANEL
ALBUMIN: 3.5 g/dL (ref 3.5–5.2)
ALT: 104 U/L — ABNORMAL HIGH (ref 0–53)
AST: 47 U/L — AB (ref 0–37)
Alkaline Phosphatase: 58 U/L (ref 39–117)
Anion gap: 7 (ref 5–15)
BILIRUBIN TOTAL: 0.8 mg/dL (ref 0.3–1.2)
BUN: 7 mg/dL (ref 6–23)
CO2: 24 mmol/L (ref 19–32)
CREATININE: 1.1 mg/dL (ref 0.50–1.35)
Calcium: 8.8 mg/dL (ref 8.4–10.5)
Chloride: 109 mmol/L (ref 96–112)
GFR calc Af Amer: >90 mL/min (ref >90)
GFR, EST NON AFRICAN AMERICAN: 84 mL/min — AB (ref >90)
Glucose, Bld: 123 mg/dL — ABNORMAL HIGH (ref 70–99)
Potassium: 3.9 mmol/L (ref 3.5–5.1)
Sodium: 140 mmol/L (ref 135–145)
Total Protein: 6.1 g/dL (ref 6.0–8.3)

## 2014-12-10 LAB — ETHANOL: Alcohol, Ethyl (B): <5 mg/dL (ref 0–9)

## 2014-12-10 MED ORDER — SODIUM CHLORIDE 0.9 % IV BOLUS (SEPSIS)
1000.0000 mL | Freq: Once | INTRAVENOUS | Status: AC
  Administered 2014-12-10: 1000 mL via INTRAVENOUS

## 2014-12-10 MED ORDER — OXYCODONE-ACETAMINOPHEN 5-325 MG PO TABS
1.0000 | ORAL_TABLET | Freq: Once | ORAL | Status: AC
  Administered 2014-12-10: 1 via ORAL
  Filled 2014-12-10: qty 1

## 2014-12-10 MED ORDER — LORAZEPAM 1 MG PO TABS: 1.0000 mg | ORAL_TABLET | Freq: Three times a day (TID) | ORAL | Status: DC | PRN

## 2014-12-10 MED ORDER — LORAZEPAM 2 MG/ML IJ SOLN
0.5000 mg | Freq: Once | INTRAMUSCULAR | Status: AC
  Administered 2014-12-10: 0.5 mg via INTRAVENOUS
  Filled 2014-12-10: qty 1

## 2014-12-10 MED ORDER — KETOROLAC TROMETHAMINE 30 MG/ML IJ SOLN
30.0000 mg | Freq: Once | INTRAMUSCULAR | Status: AC
  Administered 2014-12-10: 30 mg via INTRAVENOUS
  Filled 2014-12-10: qty 1

## 2014-12-10 NOTE — Discharge Instructions (Signed)
Take ativan as prescribed for anxiety. Follow up with neurology and with psychiatry. No driving.    Driving and Equipment Restrictions Some medical problems make it dangerous to drive, ride a bike, or use machines. Some of these problems are:  A hard blow to the head (concussion).  Passing out (fainting).  Twitching and shaking (seizures).  Low blood sugar.  Taking medicine to help you relax (sedatives).  Taking pain medicines.  Wearing an eye patch.  Wearing splints. This can make it hard to use parts of your body that you need to drive safely. HOME CARE   Do not drive until your doctor says it is okay.  Do not use machines until your doctor says it is okay. You may need a form signed by your doctor (medical release) before you can drive again. You may also need this form before you do other tasks where you need to be fully alert. MAKE SURE YOU:  Understand these instructions.  Will watch your condition.  Will get help right away if you are not doing well or get worse. Document Released: 10/21/2004 Document Revised: 12/06/2011 Document Reviewed: 01/21/2010 [facility] Patient Information 2015 [place], [place]. This information is not intended to replace advice given to you by your health care provider. Make sure you discuss any questions you have with your health care provider.  Seizure, Adult A seizure is abnormal electrical activity in the brain. Seizures usually last from 30 seconds to 2 minutes. There are various types of seizures. Before a seizure, you may have a warning sensation (aura) that a seizure is about to occur. An aura may include the following symptoms:   Fear or anxiety.  Nausea.  Feeling like the room is spinning (vertigo).  Vision changes, such as seeing flashing lights or spots. Common symptoms during a seizure include:  A change in attention or behavior (altered mental status).  Convulsions with rhythmic jerking movements.  Drooling.  Rapid  eye movements.  Grunting.  Loss of bladder and bowel control.  Bitter taste in the mouth.  Tongue biting. After a seizure, you may feel confused and sleepy. You may also have an injury resulting from convulsions during the seizure. HOME CARE INSTRUCTIONS   If you are given medicines, take them exactly as prescribed by your health care provider.  Keep all follow-up appointments as directed by your health care provider.  Do not swim or drive or engage in risky activity during which a seizure could cause further injury to you or others until your health care provider says it is OK.  Get adequate rest.  Teach friends and family what to do if you have a seizure. They should:  Lay you on the ground to prevent a fall.  Put a cushion under your head.  Loosen any tight clothing around your neck.  Turn you on your side. If vomiting occurs, this helps keep your airway clear.  Stay with you until you recover.  Know whether or not you need emergency care. SEEK IMMEDIATE MEDICAL CARE IF:  The seizure lasts longer than 5 minutes.  The seizure is severe or you do not wake up immediately after the seizure.  You have an altered mental status after the seizure.  You are having more frequent or worsening seizures. Someone should drive you to the emergency department or call local emergency services (911 in [place] MAKE SURE YOU:  Understand these instructions.  Will watch your condition.  Will get help right away if you are not doing well  or get worse. Document Released: 09/10/2000 Document Revised: 07/04/2013 Document Reviewed: 04/25/2013 [facility]Exit[facility] Patient Information 2015 [place], [place]. This information is not intended to replace advice given to you by your health care provider. Make sure you discuss any questions you have with your health care provider.
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
## 2014-12-10 NOTE — ED Notes (Signed)
Patient transported to CT 
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
## 2014-12-17 ENCOUNTER — Emergency Department (HOSPITAL_COMMUNITY)
Admission: EM | Admit: 2014-12-17 | Discharge: 2014-12-18 | Disposition: A | Discharge status: Other Acute Inpatient Hospital | Payer: Self-pay | Attending: Emergency Medicine | Admitting: Emergency Medicine

## 2014-12-17 ENCOUNTER — Encounter (HOSPITAL_COMMUNITY): Payer: Self-pay | Admitting: Emergency Medicine

## 2014-12-17 DIAGNOSIS — Z8669 Personal history of other diseases of the nervous system and sense organs: Secondary | ICD-10-CM | POA: Insufficient documentation

## 2014-12-17 DIAGNOSIS — F32A Depression, unspecified: Secondary | ICD-10-CM

## 2014-12-17 DIAGNOSIS — Z720 Tobacco use: Secondary | ICD-10-CM | POA: Insufficient documentation

## 2014-12-17 DIAGNOSIS — Z79899 Other long term (current) drug therapy: Secondary | ICD-10-CM | POA: Insufficient documentation

## 2014-12-17 DIAGNOSIS — F329 Major depressive disorder, single episode, unspecified: Secondary | ICD-10-CM

## 2014-12-17 DIAGNOSIS — R45851 Suicidal ideations: Secondary | ICD-10-CM | POA: Insufficient documentation

## 2014-12-17 LAB — RAPID URINE DRUG SCREEN, HOSP PERFORMED
AMPHETAMINES: NOT DETECTED
Barbiturates: NOT DETECTED
Benzodiazepines: NOT DETECTED
Cocaine: NOT DETECTED
Opiates: NOT DETECTED
Tetrahydrocannabinol: NOT DETECTED

## 2014-12-17 LAB — COMPREHENSIVE METABOLIC PANEL
ALT: 129 U/L — AB (ref 0–53)
AST: 52 U/L — AB (ref 0–37)
Albumin: 4.2 g/dL (ref 3.5–5.2)
Alkaline Phosphatase: 66 U/L (ref 39–117)
Anion gap: 10 (ref 5–15)
BUN: 10 mg/dL (ref 6–23)
CALCIUM: 8.7 mg/dL (ref 8.4–10.5)
CO2: 25 mmol/L (ref 19–32)
Chloride: 103 mmol/L (ref 96–112)
Creatinine, Ser: 0.93 mg/dL (ref 0.50–1.35)
GFR calc Af Amer: >90 mL/min (ref >90)
GFR calc non Af Amer: >90 mL/min (ref >90)
Glucose, Bld: 106 mg/dL — ABNORMAL HIGH (ref 70–99)
Potassium: 4.1 mmol/L (ref 3.5–5.1)
SODIUM: 138 mmol/L (ref 135–145)
TOTAL PROTEIN: 7.3 g/dL (ref 6.0–8.3)
Total Bilirubin: 1 mg/dL (ref 0.3–1.2)

## 2014-12-17 LAB — CBC
HCT: 45.4 % (ref 39.0–52.0)
Hemoglobin: 15.5 g/dL (ref 13.0–17.0)
MCH: 29.9 pg (ref 26.0–34.0)
MCHC: 34.1 g/dL (ref 30.0–36.0)
MCV: 87.5 fL (ref 78.0–100.0)
Platelets: 225 10*3/uL (ref 150–400)
RBC: 5.19 MIL/uL (ref 4.22–5.81)
RDW: 12.4 % (ref 11.5–15.5)
WBC: 9.7 10*3/uL (ref 4.0–10.5)

## 2014-12-17 LAB — ETHANOL

## 2014-12-17 LAB — ACETAMINOPHEN LEVEL: Acetaminophen (Tylenol), Serum: <10 ug/mL — ABNORMAL LOW (ref 10–30)

## 2014-12-17 LAB — SALICYLATE LEVEL: Salicylate Lvl: <4 mg/dL (ref 2.8–20.0)

## 2014-12-17 MED ORDER — GABAPENTIN 300 MG PO CAPS: 300.0000 mg | ORAL_CAPSULE | Freq: Four times a day (QID) | ORAL | Status: DC

## 2014-12-17 MED ORDER — QUETIAPINE FUMARATE 50 MG PO TABS
50.0000 mg | ORAL_TABLET | Freq: Three times a day (TID) | ORAL | Status: DC
  Administered 2014-12-17: 50 mg via ORAL
  Filled 2014-12-17: qty 1

## 2014-12-17 MED ORDER — ALUM & MAG HYDROXIDE-SIMETH 200-200-20 MG/5ML PO SUSP: 30.0000 mL | ORAL | Status: DC | PRN

## 2014-12-17 MED ORDER — QUETIAPINE FUMARATE 100 MG PO TABS
100.0000 mg | ORAL_TABLET | Freq: Every day | ORAL | Status: DC
  Administered 2014-12-17: 100 mg via ORAL
  Filled 2014-12-17: qty 1

## 2014-12-17 MED ORDER — HYDROXYZINE HCL 25 MG PO TABS
50.0000 mg | ORAL_TABLET | Freq: Four times a day (QID) | ORAL | Status: DC | PRN
  Administered 2014-12-17: 50 mg via ORAL
  Filled 2014-12-17: qty 2

## 2014-12-17 MED ORDER — ACETAMINOPHEN 325 MG PO TABS
650.0000 mg | ORAL_TABLET | ORAL | Status: DC | PRN
  Administered 2014-12-17: 650 mg via ORAL
  Filled 2014-12-17: qty 2

## 2014-12-17 MED ORDER — ONDANSETRON HCL 4 MG PO TABS: 4.0000 mg | ORAL_TABLET | Freq: Three times a day (TID) | ORAL | Status: DC | PRN

## 2014-12-17 NOTE — ED Notes (Signed)
[facility] Expected date:  Expected time:  Means of arrival:  Comments: Hold for [place]

## 2014-12-17 NOTE — ED Notes (Signed)
Pt reports increased anxiety over the past few days, hx of anxiety, just worse than usual. Pt began having suicidal thoughts, no plan. 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 Denies any substance abuse or HI.
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
## 2014-12-17 NOTE — BH Assessment (Signed)
[facility]   [facility]in[facility] by Donell SievertSpencer Simon, PA.  Attending will be Dr. Dub MikesLugo.  Nurse Aundra MilletMegan already aware.  Dr. Rubin PayorPickering informed.

## 2014-12-17 NOTE — ED Notes (Signed)
TTS in with patient.  

## 2014-12-17 NOTE — ED Notes (Signed)
Patient reports SI. Denies HI, AVH. States that his anxiety is "off the charts", denies feelings of depression at present. Patient reports racing thoughts. States that he had a seizure a week ago and is worried about having another. States that he has been ETOH free for 30 days after a relapse and wants to stay focused and not relapse again. Reports that he needs his medications to sleep. States that he is not comfortable with Neurontin as he had the recent seizure after starting to take it again.   Encouragement offered. Given Tylenol, Vistaril.  Q 15 safety checks in place.
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
## 2014-12-17 NOTE — ED Notes (Signed)
MD in room talking to patient.  I will collect labs when he finish.

## 2014-12-18 ENCOUNTER — Encounter (HOSPITAL_COMMUNITY): Payer: Self-pay | Admitting: *Deleted

## 2014-12-18 ENCOUNTER — Inpatient Hospital Stay (HOSPITAL_COMMUNITY)
Admission: EM | Admit: 2014-12-18 | Discharge: 2014-12-23 | DRG: 885 | Disposition: A | Discharge status: Home / Self Care | Payer: Federal, State, Local not specified - Other | Source: Intra-hospital | Attending: Psychiatry | Admitting: Psychiatry

## 2014-12-18 DIAGNOSIS — Z79899 Other long term (current) drug therapy: Secondary | ICD-10-CM

## 2014-12-18 DIAGNOSIS — F314 Bipolar disorder, current episode depressed, severe, without psychotic features: Secondary | ICD-10-CM | POA: Diagnosis present

## 2014-12-18 DIAGNOSIS — F17210 Nicotine dependence, cigarettes, uncomplicated: Secondary | ICD-10-CM | POA: Diagnosis present

## 2014-12-18 DIAGNOSIS — F3130 Bipolar disorder, current episode depressed, mild or moderate severity, unspecified: Secondary | ICD-10-CM | POA: Diagnosis present

## 2014-12-18 DIAGNOSIS — R45851 Suicidal ideations: Secondary | ICD-10-CM | POA: Diagnosis present

## 2014-12-18 DIAGNOSIS — F411 Generalized anxiety disorder: Secondary | ICD-10-CM | POA: Diagnosis present

## 2014-12-18 LAB — TSH: TSH: 1.547 u[IU]/mL (ref 0.350–4.500)

## 2014-12-18 MED ORDER — ALUM & MAG HYDROXIDE-SIMETH 200-200-20 MG/5ML PO SUSP
30.0000 mL | ORAL | Status: DC | PRN
  Administered 2014-12-18 – 2014-12-20 (×4): 30 mL via ORAL
  Filled 2014-12-18 (×4): qty 30

## 2014-12-18 MED ORDER — LORAZEPAM 0.5 MG PO TABS
0.5000 mg | ORAL_TABLET | Freq: Every morning | ORAL | Status: AC
  Administered 2014-12-18 – 2014-12-20 (×3): 0.5 mg via ORAL
  Filled 2014-12-18 (×3): qty 1

## 2014-12-18 MED ORDER — TRAZODONE HCL 50 MG PO TABS
50.0000 mg | ORAL_TABLET | Freq: Every evening | ORAL | Status: DC | PRN
  Administered 2014-12-18 – 2014-12-22 (×5): 50 mg via ORAL
  Filled 2014-12-18 (×3): qty 1
  Filled 2014-12-18: qty 28
  Filled 2014-12-18 (×2): qty 1
  Filled 2014-12-18: qty 28
  Filled 2014-12-18 (×7): qty 1

## 2014-12-18 MED ORDER — NICOTINE 21 MG/24HR TD PT24
21.0000 mg | MEDICATED_PATCH | Freq: Every day | TRANSDERMAL | Status: DC
  Administered 2014-12-18 – 2014-12-23 (×6): 21 mg via TRANSDERMAL
  Filled 2014-12-18 (×2): qty 14
  Filled 2014-12-18 (×7): qty 1

## 2014-12-18 MED ORDER — QUETIAPINE FUMARATE 100 MG PO TABS
100.0000 mg | ORAL_TABLET | Freq: Every day | ORAL | Status: DC
  Filled 2014-12-18: qty 1

## 2014-12-18 MED ORDER — GABAPENTIN 300 MG PO CAPS
300.0000 mg | ORAL_CAPSULE | Freq: Four times a day (QID) | ORAL | Status: DC
  Filled 2014-12-18 (×5): qty 1

## 2014-12-18 MED ORDER — HYDROXYZINE HCL 50 MG PO TABS
50.0000 mg | ORAL_TABLET | Freq: Four times a day (QID) | ORAL | Status: DC | PRN
  Administered 2014-12-18 – 2014-12-22 (×9): 50 mg via ORAL
  Filled 2014-12-18 (×2): qty 1
  Filled 2014-12-18: qty 20
  Filled 2014-12-18 (×7): qty 1

## 2014-12-18 MED ORDER — LORAZEPAM 1 MG PO TABS: 1.0000 mg | ORAL_TABLET | Freq: Three times a day (TID) | ORAL | Status: DC | PRN

## 2014-12-18 MED ORDER — QUETIAPINE FUMARATE 25 MG PO TABS
75.0000 mg | ORAL_TABLET | Freq: Three times a day (TID) | ORAL | Status: DC
  Administered 2014-12-18 – 2014-12-19 (×5): 75 mg via ORAL
  Administered 2014-12-20: 50 mg via ORAL
  Administered 2014-12-20 – 2014-12-23 (×10): 75 mg via ORAL
  Filled 2014-12-18: qty 3
  Filled 2014-12-18: qty 54
  Filled 2014-12-18 (×9): qty 3
  Filled 2014-12-18 (×2): qty 54
  Filled 2014-12-18 (×9): qty 3

## 2014-12-18 MED ORDER — QUETIAPINE FUMARATE 50 MG PO TABS
50.0000 mg | ORAL_TABLET | Freq: Three times a day (TID) | ORAL | Status: DC
  Administered 2014-12-18: 50 mg via ORAL
  Filled 2014-12-18 (×4): qty 1

## 2014-12-18 MED ORDER — MAGNESIUM HYDROXIDE 400 MG/5ML PO SUSP: 30.0000 mL | Freq: Every day | ORAL | Status: DC | PRN

## 2014-12-18 MED ORDER — ACETAMINOPHEN 325 MG PO TABS
650.0000 mg | ORAL_TABLET | Freq: Four times a day (QID) | ORAL | Status: DC | PRN
  Administered 2014-12-18 – 2014-12-19 (×2): 650 mg via ORAL
  Filled 2014-12-18 (×2): qty 2

## 2014-12-18 MED ORDER — QUETIAPINE FUMARATE 50 MG PO TABS
150.0000 mg | ORAL_TABLET | Freq: Every day | ORAL | Status: DC
  Administered 2014-12-18 – 2014-12-22 (×5): 150 mg via ORAL
  Filled 2014-12-18 (×5): qty 3
  Filled 2014-12-18: qty 42
  Filled 2014-12-18 (×6): qty 3
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
## 2014-12-18 NOTE — [facility] Suicide Risk Assessment (Addendum)
[facility]   Nursing information obtained from:  Patient Demographic factors:  Male, Caucasian, Low socioeconomic status, Unemployed Current Mental Status:  Suicidal ideation indicated by patient, Self-harm thoughts Loss Factors:  Financial problems / change in socioeconomic status Historical Factors:  Prior suicide attempts Risk Reduction Factors:  Living with another person, especially a relative, Positive social support Total Time spent with patient: 30 minutes Principal Problem: <principal problem not specified>Bipolar 1 disorder, depressed, severe Diagnosis:   Patient Active Problem List   Diagnosis Date Noted  . Bipolar 1 disorder, depressed, severe [F31.4] 12/18/2014  . Agitation [R45.1]   . Substance induced mood disorder [F19.94] 06/15/2014  . Anxiety state, unspecified [F41.1] 08/29/2013  . GAD (generalized anxiety disorder) [F41.1] 08/29/2013  . Panic attacks [F41.0] 08/29/2013  . MDD (major depressive disorder), recurrent episode, severe [F33.2] 08/28/2013  . Alcohol dependence [F10.20] 08/12/2013  . Bipolar I disorder, most recent episode (or current) depressed, severe, without mention of psychotic behavior [F31.4] 08/12/2013     Continued Clinical Symptoms:  Alcohol Use Disorder Identification Test Final Score (AUDIT): 16 The "Alcohol Use Disorders Identification Test", Guidelines for Use in Primary Care, Second Edition.  World Science writerHealth Organization [facility](WHO). Score between 0-7:  no or low risk or alcohol related problems. Score between 8-15:  moderate risk of alcohol related problems. Score between 16-19:  high risk of alcohol related problems. Score 20 or above:  warrants further diagnostic evaluation for alcohol dependence and treatment.   CLINICAL FACTORS:   Bipolar Disorder:   Depressive phase   Musculoskeletal: Strength & Muscle Tone: within normal limits Gait & Station: normal Patient leans: N/A  Psychiatric Specialty  Exam: Physical Exam  ROS  Blood pressure 114/82, pulse 101, temperature 98 F (36.7 C), temperature source Oral, resp. rate 20, height 6\' 3"  (1.905 m), weight 97.523 kg (215 lb).Body mass index is 26.87 kg/(m^2).  General Appearance: Casual and Guarded  Eye Contact::  Fair  Speech:  Normal Rate  Volume:  Normal  Mood:  Anxious and Depressed  Affect:  Appropriate, Congruent and Depressed  Thought Process:  Coherent and Goal Directed  Orientation:  Full (Time, Place, and Person)  Thought Content:  Rumination  Suicidal Thoughts:  Yes.  without intent/plan  Homicidal Thoughts:  No  Memory:  Negative  Judgement:  Poor  Insight:  Shallow  Psychomotor Activity:  Decreased  Concentration:  Poor  Recall:  Poor  Fund of Knowledge:Poor  Language: Fair  Akathisia:  Negative  Handed:  Right  AIMS (if indicated):     Assets:  Communication Skills Desire for Improvement Housing Intimacy Physical Health Social Support  Sleep:  Number of Hours: 3.25  Cognition: WNL  ADL's:  Intact     COGNITIVE FEATURES THAT CONTRIBUTE TO RISK:  Closed-mindedness and Thought constriction (tunnel vision)    SUICIDE RISK:   Moderate:  Frequent suicidal ideation with limited intensity, and duration, some specificity in terms of plans, no associated intent, good self-control, limited dysphoria/symptomatology, some risk factors present, and identifiable protective factors, including available and accessible social support.  PLAN OF CARE: Admit for safety/stabilization.  D/C gabapentin (pt reports having a seizure last week after taking it). Will increase Seroquel to 75 mg TID and 150 mg QHS (for anxiety/mood symptoms).   Medical Decision Making:  New problem, with additional work up planned, Review of Medication Regimen & Side Effects (2) and Review of New Medication or Change in Dosage (2)  I certify that inpatient services furnished can reasonably  be expected to improve the patient's condition.    Ancil Linsey 12/18/2014, 10:52 AM  Addendum: will taper off ativan, since pt reports taking 1 mg daily for 2 weeks (for anxiety). Pt has a history of alcohol dependence. Will decrease ativan to 0.5 mg daily for 3 days, then d/c. Will need to monitor for seizures, since pt reportedly had a seizure 1-2 weeks ago.  Ancil Linsey, MD

## 2014-12-18 NOTE — [facility] Group Notes (Signed)
[facility]   12/18/2014 9:58 AM  Participation Quality:  Invited-DID NOT ATTEND. Pt in room sleeping.   Smart, LandAmerica FinancialHeather  LCSWA

## 2014-12-18 NOTE — Progress Notes (Signed)
D: Patient denies SI/HI and A/V hallucinations; patient is reporting anxiety and refused his neurotin and reports " it makes me wig out and makes me feel more anxious"   A: Monitored q 15 minutes; patient encouraged to attend groups; patient educated about medications; patient given medications per physician orders; patient encouraged to express feelings and/or concerns  R: Patient is cooperative; patient is pleasant;  patient's interaction with staff and peers is appropriate; patient was able to set goal to talk with staff 1:1 when having feelings of SI; patient is taking medications as prescribed and tolerating medications; patient is attending most groups

## 2014-12-18 NOTE — Progress Notes (Signed)
[facility]. +SI, denies plan, verbally contracts for safety. -HI, AVH. Patient reports racing thoughts, increased anxiety and panic attacks "I was afraid I was going to start back drinking". Reports being alcohol free for 30 days after a 6 month relapse.  Report his stressors being job and finances. Pt has history of multiple admissions and suicide attempts. [facility] and attends AA meetings. Emotional support and encouragement given. Pt admitted for evaluation, stabilization and reduction of baseline. Will monitor closely.

## 2014-12-18 NOTE — Progress Notes (Signed)
Recreation Therapy Notes  Date: 03.23.2016 Time: 9:30am Location: [address] Dayroom   Group Topic: Stress Management  Goal Area(s) Addresses:  Patient will actively participate in stress management techniques presented during session.   Behavioral Response: Did not attend.   Marykay Lexenise L Susano Cleckler, LRT/CTRS  Jearl KlinefelterBlanchfield, Cheyenna Pankowski L 12/18/2014 4:03 PM

## 2014-12-18 NOTE — Tx Team (Signed)
Interdisciplinary Treatment Plan Update (Adult)   Date: 12/18/2014  Time Reviewed:8:32 AM  Progress in Treatment:  Attending groups: No-new to unit. Did not attend morning d/c planning group.  Participating in groups:  No.   Taking medication as prescribed: Yes  Tolerating medication: Yes  Family/Significant othe contact made: Not yet. SPE required for this pt.   Patient understands diagnosis: Yes, AEB seeking treatment for passive SI, increased depression/anxiety/panic attacks, and for medication stabilization.  Discussing patient identified problems/goals with staff: Yes  Medical problems stabilized or resolved: Yes  Denies suicidal/homicidal ideation: Passive SI/able to contract for safety on unit.   Patient has not harmed self or Others: Yes  New problem(s) identified:  Discharge Plan or Barriers: Pt not attending d/c planning at this time. CSW assessing for appropriate referrals. PSA needed.  Additional comments: [name] is an 39 y.o. Male. Pt is wanting to get help for his SI. No plan, increased anxiety. Patient presents to be depressed and anxious. He has been having some panic attacks, three in the last week. Pt says "I feel like I am going to jump out of my skin." Has poor sleep, no more than about 5 hours per night despite taking seroquel. Pt having "racing thoughts." He says that he avoids crowds as much as possible.Patient has SI but no plan. He says that he has been having more frequent thoughts of harming himself over the last three days. He says that he has no plan but pt has a past hx of two previous attempts. Patient says "I came in because I don't think I can keep myself safe." Pt denies any HI or A/V hallucinations.Patient reports being clean of ETOH or other drugs for the last 30 days. He reports having a two day relapse a month ago after having 5 months of sobriety. Patient is disappointed about the relapse or "slip up." He is very active with AA and goes to  meetings at the [employer] frequently. He talked with his sponsor and several friends in recovery about coming in to [facility] and they offered encouragement and support.[facility]. He says however that over the last several weeks he has had them to call him and reschedule his appts with psychiatrist. His last refill was with their "10 minute refill clinic." His next appt with psychiatrist is in April.  Reason for Continuation of Hospitalization: Depression/mood instability  SI/passive Medication stabilization Estimated length of stay: 3-5 days  For review of initial/current patient goals, please see plan of care.  Attendees:  Patient:    Family:    Physician: Dr. Elna BreslowEappen MD 12/18/2014 8:32 AM   Nursing: Madelaine BhatAdam RN; Laverle PatterBritney T. RN 12/18/2014 8:32 AM   Clinical Social Worker Bria Portales Smart, LCSWA  12/18/2014 8:32 AM   Other: Marene LenzQuylle LCSWAlanson Puls; Kristin LCSWA 12/18/2014 8:32 AM   Other: Darden DatesJennifer C. Nurse CM 12/18/2014 8:32 AM   Other: Liliane Badeolora Sutton, Community Care Coordinator  12/18/2014 8:32 AM   Other: Darden DatesJennifer C. Nurse CM 12/18/2014 8:32 AM   Scribe for Treatment Team:  The Sherwin-WilliamsHeather Smart LCSWA 12/18/2014 8:32 AM

## 2014-12-18 NOTE — [facility] Group Notes (Signed)
[facility] LCSW Group Therapy  12/18/2014 3:51 PM  Type of Therapy:  Group Therapy  Participation Level:  Active  Participation Quality:  Attentive  Affect:  Appropriate  Cognitive:  Alert and Oriented  Insight:  Engaged  Engagement in Therapy:  Engaged  Modes of Intervention:  Confrontation, Discussion, Education, Exploration, Problem-solving, Rapport Building, Socialization and Support  Summary of Progress/Problems: Emotion Regulation: This group focused on both positive and negative emotion identification and allowed group members to process ways to identify feelings, regulate negative emotions, and find healthy ways to manage internal/external emotions. Group members were asked to reflect on a time when their reaction to an emotion led to a negative outcome and explored how alternative responses using emotion regulation would have benefited them. Group members were also asked to discuss a time when emotion regulation was utilized when a negative emotion was experienced. [name] was attentive and engaged during today's processing group. He shared that anxiety has been the emotion most difficult for him to control today. "My goal is get on the right medications to help me with anxiety while working on better coping skills." He shared his hx of alcohol abuse "to self medicate and control my anxiety. I don't want to turn back to that." [name] continues to show progress in the group setting AEB his ability to identify supports (AA sponsor, girlfriend, some family), and coping skills that have helped him manage anxiety in the past and remain sober (meetings, talking with a therapist, and staying on top of taking medications).    [name], [name] LCSWA  12/18/2014, 3:51 PM

## 2014-12-18 NOTE — Progress Notes (Addendum)
D:  Pt passive SI-contracts for safety. Pt denies HI/AVH. Pt is pleasant and cooperative. Pt stated he was doing good. Pt was really concerned that he may relapse that he came in to hospital.   A: Pt was offered support and encouragement. Pt was given scheduled medications. Pt was encourage to attend groups. Q 15 minute checks were done for safety.   R:Pt attends groups and interacts well with peers and staff. Pt is taking medication. Pt has no complaints at this time .Pt receptive to treatment and safety maintained on unit.
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
## 2014-12-18 NOTE — Tx Team (Signed)
Initial Interdisciplinary Treatment Plan   PATIENT STRESSORS: Financial difficulties Occupational concerns Substance abuse   PATIENT STRENGTHS: Ability for insight Capable of independent living Motivation for treatment/growth Supportive family/friends Work skills   PROBLEM LIST: Problem List/Patient Goals Date to be addressed Date deferred Reason deferred Estimated date of resolution  depression 12/18/14   At d/c  Suicidal ideations 12/18/14   At d/c  Substance abuse 12/18/14   At d/c                                       DISCHARGE CRITERIA:  Improved stabilization in mood, thinking, and/or behavior Motivation to continue treatment in a less acute level of care Need for constant or close observation no longer present Verbal commitment to aftercare and medication compliance  PRELIMINARY DISCHARGE PLAN: Outpatient therapy Return to previous living arrangement  PATIENT/FAMIILY INVOLVEMENT: This treatment plan has been presented to and reviewed with the patient, Ailene RavelJason M Fury.  The patient and family have been given the opportunity to ask questions and make suggestions.  Celene KrasRobinson, Birdie Fetty G 12/18/2014, 1:45 AM

## 2014-12-19 ENCOUNTER — Encounter (HOSPITAL_COMMUNITY): Payer: Self-pay | Admitting: Registered Nurse

## 2014-12-19 LAB — LIPID PANEL
Cholesterol: 194 mg/dL (ref 0–200)
HDL: 49 mg/dL (ref >39)
LDL CALC: 110 mg/dL — AB (ref 0–99)
TRIGLYCERIDES: 175 mg/dL — AB (ref <150)
Total CHOL/HDL Ratio: 4 RATIO
VLDL: 35 mg/dL (ref 0–40)

## 2014-12-19 MED ORDER — IBUPROFEN 600 MG PO TABS
600.0000 mg | ORAL_TABLET | Freq: Four times a day (QID) | ORAL | Status: DC | PRN
  Administered 2014-12-19 – 2014-12-23 (×6): 600 mg via ORAL
  Filled 2014-12-19 (×6): qty 1

## 2014-12-19 NOTE — [facility] Group Notes (Signed)
[facility] Group Notes:  (Nursing/MHT/Case Management/Adjunct)  Date:  12/19/2014  Time:  0900am  Type of Therapy:  Nurse Education  Participation Level:  Active  Participation Quality:  Appropriate  Affect:  Appropriate  Cognitive:  Alert and Appropriate  Insight:  Appropriate  Engagement in Group:  Engaged  Modes of Intervention:  Discussion and Support  Summary of Progress/Problems: Patient attended group, remained engaged, and responded appropriately when prompted.  Lendell CapriceGuthrie, Fleeta Kunde A 12/19/2014, 10:20 AM
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
## 2014-12-19 NOTE — [facility] Group Notes (Signed)
[facility] LCSW Group Therapy 12/19/2014  1:15 pm   Type of Therapy: Group Therapy Participation Level: Active  Participation Quality: Attentive, Sharing and Supportive  Affect: Appropriate  Cognitive: Alert and Oriented  Insight: Developing/Improving and Engaged  Engagement in Therapy: Developing/Improving and Engaged  Modes of Intervention: Clarification, Confrontation, Discussion, Education, Exploration, Limit-setting, Orientation, Problem-solving, Rapport Building, Dance movement psychotherapisteality Testing, Socialization and Support  Summary of Progress/Problems: The topic for group was balance in life. Today's group focused on defining balance in one's own words, identifying things that can knock one off balance, and exploring healthy ways to maintain balance in life. Group members were asked to provide an example of a time when they felt off balance, describe how they handled that situation,and process healthier ways to regain balance in the future. Group members were asked to share the most important tool for maintaining balance that they learned [facility] and how they plan to apply this method after discharge. Patient discussed feeling out of balance with depression, anxiety, emotionally/mentally. Patient reports that he will engage in self-care by getting his medications stabilized and continuing to engage with his support system through AA. CSW and other group members provided patient with emotional support and encouragement.   Steven BruinKristin Conception Doebler, MSW, [employer] IncLCSWA Clinical Social Worker [facility] [phone]

## 2014-12-19 NOTE — Progress Notes (Signed)
D: Patient is alert and oriented. Pt's mood and affect are anxious, pt pleasant upon interaction. Pt denies SI/HI and AVH. Pt rates depression and hopelessness 2/10, and anxiety 10/10. Pt reports concerns that his anxiety is not controlled and pt is requesting a decongestant. Pt complains of headache this morning 4/10 with some relief from PRN medication. Pt complains of indigestion this morning with relief from PRN medication. Pt reports his goal for the day is "to have these high levels of anxiety reduced." Pt complains of headache 3/10 this afternoon which decreased with PRN medication. A: MD, Akintayo made aware of pt's concerns/complaints. Encouragement/Support provided to pt. Active listening by RN. PRN medication administered for pain, anxiety, and indigestion per providers orders (See MAR). Scheduled medications administered per providers orders (See MAR). 15 minute checks continued per protocol for patient safety.  R: Patient cooperative and receptive to nursing interventions. Pt remains safe.

## 2014-12-19 NOTE — Progress Notes (Signed)
Patient did attend the evening karaoke group. Pt was engaged and supportive but did not sing a song.   

## 2014-12-19 NOTE — Progress Notes (Signed)
Pt attended group 

## 2014-12-19 NOTE — Plan of Care (Signed)
Problem: Ineffective individual coping Goal: STG: Patient will remain free from self harm Outcome: Progressing Patient remains free from self harm. 15 minute checks continued per protocol for patient safety.   Problem: Diagnosis: Increased Risk For Suicide Attempt Goal: LTG-Patient Will Report Improved Mood and Deny Suicidal LTG (by discharge) Patient will report improved mood and deny suicidal ideation.  Outcome: Progressing Patient denies having any suicidal thoughts today.  Goal: STG-Patient Will Attend All Groups On The Unit Outcome: Progressing Patient is attending unit groups. Goal: STG-Patient Will Comply With Medication Regime Outcome: Progressing Patient has adhered to medication regimen today with ease.

## 2014-12-20 LAB — PROLACTIN: PROLACTIN: 15.3 ng/mL — AB (ref 4.0–15.2)

## 2014-12-20 LAB — GABAPENTIN LEVEL: Gabapentin Lvl: <0.5 ug/mL

## 2014-12-20 LAB — HEMOGLOBIN A1C
HEMOGLOBIN A1C: 5.4 % (ref 4.8–5.6)
Mean Plasma Glucose: 108 mg/dL

## 2014-12-20 MED ORDER — PROPRANOLOL HCL 10 MG PO TABS
10.0000 mg | ORAL_TABLET | Freq: Three times a day (TID) | ORAL | Status: DC
  Administered 2014-12-20 – 2014-12-23 (×9): 10 mg via ORAL
  Filled 2014-12-20 (×2): qty 1
  Filled 2014-12-20: qty 42
  Filled 2014-12-20: qty 1
  Filled 2014-12-20: qty 42
  Filled 2014-12-20 (×3): qty 1
  Filled 2014-12-20: qty 42
  Filled 2014-12-20: qty 1
  Filled 2014-12-20: qty 42
  Filled 2014-12-20: qty 1
  Filled 2014-12-20: qty 42
  Filled 2014-12-20 (×2): qty 1
  Filled 2014-12-20: qty 42
  Filled 2014-12-20 (×2): qty 1

## 2014-12-20 NOTE — Progress Notes (Signed)
D: Patient complains of anxiety this morning.  Patient is pleasant and cooperative.  He reports he is sleeping and eating well.  He rates his depression as a 1; hopelessness as a 0; anxiety as a 9.  He denies any withdrawal symptoms.  He denies SI/HI/AVH.  Patient is attending groups and participating in his treatment.   A: Continue to monitor medication management and MD orders.  Safety checks completed every 15 minutes per protocol.  Meet 1:1 with patient to discuss concerns and offer encouragement. R: Patient's behavior is appropriate to situation.

## 2014-12-20 NOTE — [facility] Group Notes (Signed)
[facility] LCSW Group Therapy  12/20/2014 3:40 PM  Type of Therapy:  Group Therapy  Participation Level:  Active  Participation Quality:  Attentive  Affect:  Appropriate  Cognitive:  Alert and Oriented  Insight:  Engaged  Engagement in Therapy:  Engaged  Modes of Intervention:  Confrontation, Discussion, Education, Exploration, Problem-solving, Rapport Building, Socialization and Support  Summary of Progress/Problems: Feelings around Relapse. Group members discussed the meaning of relapse and shared personal stories of relapse, how it affected them and others, and how they perceived themselves during this time. Group members were encouraged to identify triggers, warning signs and coping skills used when facing the possibility of relapse. Social supports were discussed and explored in detail. Post Acute Withdrawal Syndrome (handout provided) was introduced and examined. Pt's were encouraged to ask questions, talk about key points associated with PAWS, and process this information in terms of relapse prevention. [name] was attentive and engaged during today's processing group. He shared that through his latest mental illness relapse, he learned that he needs to seek out help as soon as he notices symptoms returning. [name] talked about the importance of mindfulness and awareness, particularly when it relates to his anxiety. [name] also talked about his experiences with PAWS and began developing a safety plan to cope with PAWS symptoms.   [name], [name] LCSWA  12/20/2014, 3:40 PM

## 2014-12-20 NOTE — Progress Notes (Signed)
Recreation Therapy Notes  Date: 03.25.2016 Time: 9:30am Location: [address] Group Room   Group Topic: Stress Management  Goal Area(s) Addresses:  Patient will actively participate in stress management techniques presented during session.   Behavioral Response: Engaged, Appropriate   Intervention: Art  Activity :  Mandala coloring and music of patient choice.   Education:  Stress Management, Discharge Planning.   Education Outcome: Acknowledges education  Clinical Observations/Feedback: Patient actively engaged in coloring selected mandala and sang songs in unison with peers. Patient expressed he felt relaxed at conclusion of session.    Marykay Lexenise L Lynnann Knudsen, LRT/CTRS  Paquita Printy L 12/20/2014 10:30 AM

## 2014-12-20 NOTE — [facility] Group Notes (Signed)
[facility]   12/20/2014 1:03 PM  Participation Quality:  Appropriate   Mood/Affect:  Appropriate  Depression Rating:  1  Anxiety Rating:  10  Thoughts of Suicide:  No Will you contract for safety?   NA  Current AVH:  No  Plan for Discharge/Comments:  Pt reports that anxiety is still high and he does not feel difference in medication yet. Pt hoping to learn more about Busbar as a potential option for him. CSW encouraged pt to speak with MD about this medication. Pt plans to return home at d/c and [facility].   Transportation Means: bus   Supports: some friend supports/AA friends and sponsor.   Smart, American FinancialHeather LCSWA

## 2014-12-20 NOTE — Progress Notes (Signed)
Patient ID: Ailene RavelJason M Ouch, male   DOB: 1976-04-21, 39 y.o.   MRN: [id number] D)  Has been in the dayroom most of the evening, interacting appropriately with staff and peers, playing cards and watching tv.  Attended AA group this evening, pleasant, cooperative, and has been compliant with meds.  Requested and was given motrin for c/o backache, stated still dealing with his anxiety, hoping the inderal will help.   A)  Support, praise for his compliance, will continue to monitor for safety, continue POC R)  Appreciative, safety maintained.

## 2014-12-20 NOTE — Progress Notes (Signed)
Patient ID: Steven RavelJason M [name], male   DOB: Dec 10, 1975, 39 y.o.   MRN: [id number] D)  Has been active and visible on the hall this evening, interacting appropriately with staff and peers.  Attended karaoke and enjoyed the activity, came back in good spirits, but talked about his anxiety being the main issue he deals with.  Discussed ways to deal with it rather than using klonopin as he had in the past, eg activities, yard work, sports, fishing, etc., and encouraged him to think of things that he finds relaxing.  Also gave him a journal to write in, and he talked about sketching and music as therapy as well, enjoys playing guitar.  Denies thoughts of self harm at this time, compliant with meds, and  programming. A)  Support and encouragement, will continue to monitor for safety, continue POC R)  Receptive, appreciative, Safety maintained.

## 2014-12-20 NOTE — [facility] Suicide Risk Assessment (Signed)
[facility] INPATIENT:  Family/Significant Other Suicide Prevention Education  Suicide Prevention Education:  Education Completed; [name] (pt's girlfriend) [phone] has been identified by the patient as the family member/significant other with whom the patient will be residing, and identified as the person(s) who will aid the patient in the event of a mental health crisis (suicidal ideations/suicide attempt).  With written consent from the patient, the family member/significant other has been provided the following suicide prevention education, prior to the and/or following the discharge of the patient.  The suicide prevention education provided includes the following:  Suicide risk factors  Suicide prevention and interventions  National Suicide Hotline telephone number  [facility] assessment telephone number  [facility] Emergency Assistance 911  [facility] and/or Residential Mobile Crisis Unit telephone number  Request made of family/significant other to:  Remove weapons (e.g., guns, rifles, knives), all items previously/currently identified as safety concern.    Remove drugs/medications (over-the-counter, prescriptions, illicit drugs), all items previously/currently identified as a safety concern.  The family member/significant other verbalizes understanding of the suicide prevention education information provided.  The family member/significant other agrees to remove the items of safety concern listed above.  [name], [name] LCSWA  12/20/2014, 4:04 PM
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
## 2014-12-21 NOTE — [facility] Group Notes (Signed)
[facility] Group Notes:  (Clinical Social Work)  12/21/2014     2:30-3:30PM  Summary of Progress/Problems:   The main focus of today's process group was to learn how to use a decisional balance exercise to move forward in the Stages of Change, which were described and discussed.  Motivational Interviewing and a worksheet were utilized to help patients explore in depth the perceived benefits and costs of a self-sabotaging behavior, as well as the  benefits and costs of replacing that with a healthy coping mechanism.   The patient expressed himself thoughtfully and insightfully throughout group.  He identified rumination as one unhealthy coping skill, along with drinking/drugging, that he uses.  He was able to identify reasons he does these, as well as how his life could look if he made decisions to learn healthy coping skills.  Type of Therapy:  Group Therapy - Process   Participation Level:  Active  Participation Quality:  Attentive  Affect:  Flat  Cognitive:  Oriented  Insight:  Developing/Improving  Engagement in Therapy:  Developing/Improving  Modes of Intervention:  Education, Motivational Interviewing  Ambrose MantleMareida Grossman-Orr, LCSW 12/21/2014, 4:59 PM

## 2014-12-21 NOTE — [facility] Group Notes (Signed)
[facility] Group Notes:  (Nursing/MHT/Case Management/Adjunct)  Date:  12/21/2014  Time: 0900 am  Type of Therapy:  Psychoeducational Skills  Participation Level:  Active  Participation Quality:  Appropriate and Attentive  Affect:  Anxious  Cognitive:  Alert and Appropriate  Insight:  Lacking  Engagement in Group:  Engaged  Modes of Intervention:  Support  Summary of Progress/Problems:  [name], [name] 12/21/2014, 10:46 AM

## 2014-12-21 NOTE — Progress Notes (Signed)
D: Patient is up in the milieu.  He is currently in the day room playing cards and interacting with others.  Patient continues to complain of severe anxiety and denies SI/HI/AVH.  His goal is to "get his anxiety reduced."  Patient rates his depression as a 1; hopelessness as a 0; anxiety as an 8.  He denies any withdrawal symptoms.  His goal is "to talk to the Dr. And another person to get things off my chest."   A: Continue to monitor medication management and MD orders.  Safety checks completed every 15 minutes per protocol.  Meet 1:1 with patient to discuss concerns and offer encouragement. R: Patient's behavior is appropriate to situation.

## 2014-12-21 NOTE — Plan of Care (Signed)
Problem: Alteration in mood Goal: STG-Patient is able to discuss feelings and issues (Patient is able to discuss feelings and issues leading to depression)  Outcome: Progressing Patient able to express his feelings to staff and others.  Participates during group.

## 2014-12-21 NOTE — Progress Notes (Signed)
D. Pt pleasant on approach, in dayroom playing cards with peers.  No complaints voiced.  Positive for evening AA group with appropriate participation.  Interacting appropriately with peers on the unit.  Denies SI/HI/hallucinations at this time.  A.  Support and encouragement offered  R.  Pt remains safe on the unit, will continue to monitor.
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
## 2014-12-21 NOTE — [facility] Group Notes (Signed)
Adult Psychoeducational Group Note  Date:  12/21/2014 Time:  12:01 AM  Group Topic/Focus:  AA Meeting  Participation Level:  Active  Participation Quality:  Appropriate  Affect:  Appropriate  Cognitive:  Appropriate  Insight: Good  Engagement in Group:  Engaged  Modes of Intervention:  Discussion and Education  Additional Comments:  [name] was engaged and shared his story with the group.  Caroll RancherLindsay, Felipe Cabell A 12/21/2014, 12:01 AM

## 2014-12-21 NOTE — Progress Notes (Signed)
Patient did attend the evening speaker AA meeting.  

## 2014-12-22 NOTE — Progress Notes (Signed)
Patient did attend the evening speaker AA meeting.  

## 2014-12-22 NOTE — Plan of Care (Signed)
Problem: Alteration in mood & ability to function due to Goal: STG-Patient will attend groups Outcome: Progressing Pt not only attending groups and active participation this evening, Pt also was seen in dayroom reviewing passages from AA big blue book with peer who had not been able to attend evening group.

## 2014-12-22 NOTE — Progress Notes (Signed)
D.  Pt pleasant on approach, denies complaints at this time.  Positive for evening AA group with appropriate participation.  Interacting appropriately with peers on the unit.  Seen assisting another patient with the AA big blue book in dayroom.  Denies SI/HI/hallucinations at this time.  A.  Support and encouragement offered  R.  Pt remains safe on unit, will continue to monitor.

## 2014-12-22 NOTE — Progress Notes (Signed)
D: Patient reports decreased anxiety; he feels that medication is helping.  He reports some sluggishness this morning.  He rates his depression and hopelessness as a 0; anxiety as a 5.  He denies any SI/HI/AVH.  Patient is attending groups and participating.  He is visible on the milieu and is interacting well with staff and peers.  He reports pain from a backache. A: Continue to monitor medication management and MD orders.  Safety checks completed every 15 minutes per protocol.  Meet 1:1 with patient to discuss concerns and offer encouragement. R: Patient's behavior is appropriate to situation.
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
## 2014-12-22 NOTE — [facility] Group Notes (Signed)
[facility] Group Notes:  (Clinical Social Work)  12/22/2014   10:00am-11:00am  Summary of Progress/Problems:  The main focus of today's process group was to discuss different healthy supports that can be put into place at discharge, including music.  Several inspirational songs were played and lyrics handed out, discussed.   The patient expressed understanding of the need to use a variety of support including professionals, music, walking, AA/NA meetings, more.  He was very engaged throughout group and was encouraging to others, expressed a willingness to add a variety of supports, to return to AA.  Uses music as a positive recovery tool.  Type of Therapy:  Music Therapy   Participation Level:  Active  Participation Quality:  Attentive and Sharing  Affect:  Blunted  Cognitive:  Oriented  Insight:  Engaged  Engagement in Therapy:  Engaged  Modes of Intervention:   Activity, Exploration  Ambrose MantleMareida Grossman-Orr, LCSW 12/22/2014, 11:00am

## 2014-12-23 DIAGNOSIS — F314 Bipolar disorder, current episode depressed, severe, without psychotic features: Principal | ICD-10-CM

## 2014-12-23 MED ORDER — TRAZODONE HCL 50 MG PO TABS: 50.0000 mg | ORAL_TABLET | Freq: Every evening | ORAL | Status: DC | PRN

## 2014-12-23 MED ORDER — PROPRANOLOL HCL 10 MG PO TABS: 10.0000 mg | ORAL_TABLET | Freq: Three times a day (TID) | ORAL | Status: DC

## 2014-12-23 MED ORDER — QUETIAPINE FUMARATE 25 MG PO TABS: 75.0000 mg | ORAL_TABLET | Freq: Three times a day (TID) | ORAL | Status: DC

## 2014-12-23 MED ORDER — QUETIAPINE FUMARATE 50 MG PO TABS: 150.0000 mg | ORAL_TABLET | Freq: Every day | ORAL | Status: DC

## 2014-12-23 NOTE — Tx Team (Signed)
Interdisciplinary Treatment Plan Update (Adult)   Date: 12/23/2014  Time Reviewed:10:07 AM  Progress in Treatment:  Attending groups: Yes  Participating in groups:  Yes  Taking medication as prescribed: Yes  Tolerating medication: Yes  Family/Significant othe contact made: Yes, SPE completed with pt's girlfriend/roomate  Patient understands diagnosis: Yes, AEB seeking treatment for passive SI, increased depression/anxiety/panic attacks, and for medication stabilization.  Discussing patient identified problems/goals with staff: Yes  Medical problems stabilized or resolved: Yes  Denies suicidal/homicidal ideation: yes, during group and self report.    Patient has not harmed self or Others: Yes  New problem(s) identified:  Discharge Plan or Barriers: [facility] appts made. Pt also has intake assessment with Noni SaupeJamie Duvall through [facility] scheduled for tomorrow. PT plans to return home today. His friend will transport him home after lunch.  Additional comments: [name] is an 39 y.o. Male. Pt is wanting to get help for his SI. No plan, increased anxiety. Patient presents to be depressed and anxious. He has been having some panic attacks, three in the last week. Pt says "I feel like I am going to jump out of my skin." Has poor sleep, no more than about 5 hours per night despite taking seroquel. Pt having "racing thoughts." He says that he avoids crowds as much as possible.Patient has SI but no plan. He says that he has been having more frequent thoughts of harming himself over the last three days. He says that he has no plan but pt has a past hx of two previous attempts. Patient says "I came in because I don't think I can keep myself safe." Pt denies any HI or A/V hallucinations.Patient reports being clean of ETOH or other drugs for the last 30 days. He reports having a two day relapse a month ago after having 5 months of sobriety. Patient is disappointed about the  relapse or "slip up." He is very active with AA and goes to meetings at the [employer] frequently. He talked with his sponsor and several friends in recovery about [facility] and they offered encouragement and support.[facility]. He says however that over the last several weeks he has had them to call him and reschedule his appts with psychiatrist. His last refill was with their "10 minute refill clinic." His next appt with psychiatrist is in April.  3/28:Pt has been active on the milieu and attending groups/good participation. Pt reprots anxiety has lessoned significantly and that he has been learning effective coping skills to deal with anxiety and panic. Pt reports that he feels stable for dc today. Reason for Continuation of Hospitalization: none  Estimated length of stay: d/c scheduled for today.  For review of initial/current patient goals, please see plan of care.  Attendees:  Patient:    Family:    Physician: Dr. Jannifer FranklinAkintayo MD  12/23/2014 10:07 AM   Nursing: Herbert Moorsonecia, Britney, Beverly RN 12/23/2014 10:07 AM   Clinical Social Worker Kadejah Sandiford Smart, LCSWA  12/23/2014 10:07 AM   Other: Alanson PulsKristin LCSWA 12/23/2014 10:07 AM   Other: Darden DatesJennifer C. Nurse CM 12/23/2014 10:07 AM   Other: Liliane Badeolora Sutton, Community Care Coordinator  12/23/2014 10:07 AM   Other:  12/23/2014 10:07 AM   Scribe for Treatment Team:  Herbert SetaHeather Smart LCSWA 12/23/2014 10:07 AM
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
## 2014-12-23 NOTE — Progress Notes (Signed)
  [facility] Discharge Plan :  Will you be returning to the same living situation after discharge:  Yes,  returning home At discharge, do you have transportation home?: Yes,  friend coming after lunch to pick him up.  Do you have the ability to pay for your medications: Yes,  mental health  Release of information consent forms completed and submitted to Medical Records by CSW. Patient to Follow up at: Follow-up Information    Follow up with [facility] On 01/15/2015.   Why:  Appt on this date at 2:40PM [facility] for [facility] management. Please cancel this appt if you decide to follow-up with [facility] for medication management.    Contact information:   [address]  [place], [place] [address] Phone: [phone] Fax: [phone]      Follow up with [facility] On 12/25/2014.   Why:  Appt. with Rudi RummageSharon Burkett on this date at 9:00AM for therapy. Please make sure to bring photo ID.    Contact information:   [address] [place], [place] [address] Phone: [phone] Fax: [phone]      Follow up with [facility].   Why:  Social worker left message to schedule intake assessment on 12/23/14.Please follow-up with Noni SaupeJamie Duvall if interested in medication management/therapy assessment.     Contact information:   [address] [place], [place] [address] Phone: [phone] Fax: [phone]      Patient denies SI/HI: Yes,  during group/self report.     Safety Planning and Suicide Prevention discussed: Yes,  SPE completed with pt's girlfriend. SPI pamphlet provided to pt and he was encouraged to share information with support network, ask questions, and talk about any concerns relating to SPE.  Have you used any form of tobacco in the last 30 days? (Cigarettes, Smokeless Tobacco, Cigars, and/or Pipes): Yes  Has patient been referred to the Quitline?: Patient refused referral  Smart, Lebron QuamHeather LCSWA  12/23/2014, 9:58 AM

## 2014-12-23 NOTE — Progress Notes (Signed)
Discharge Note:  Patient discharged with friend.  Denied SI and HI.  Denied A/V hallucinations.  Suicide prevention information given and discussed with patient who stated he understood and had no questions.  Patient stated he received all his belongings, sweatshirt, shoes, wallet, tobacco container, sneakers/strings, clothing, misc items, prescriptions, medications.  Patient stated he appreciated all assistance received from [facility].

## 2014-12-23 NOTE — Progress Notes (Signed)
Recreation Therapy Notes  Date: 03.28.2016 Time: 9:30am Location: [address] Group Room   Group Topic: Stress Management  Goal Area(s) Addresses:  Patient will actively participate in stress management techniques presented during session.   Behavioral Response: Engaged, Attentive.   Intervention: Stress Management Techniques  Activity :  Deep Breathing and Progressive Muscle Relaxation. Patient was instructed on how to independently complete both deep breathing and progressive muscle relaxations.   Education:  Stress Management, Discharge Planning.   Education Outcome: Acknowledges edcuation  Clinical Observations/Feedback: Patient actively engaged in session, practicing techniques with LRT and peers and displaying ability to practice independently post d/c.   Marykay Lexenise L Cailee Blanke, LRT/CTRS  Jearl KlinefelterBlanchfield, Latisa Belay L 12/23/2014 12:27 PM

## 2014-12-23 NOTE — BHH Group Notes (Signed)
[facility] After[facility] Discharge Planning Group Note   12/23/2014 11:47 AM  Participation Quality:  Appropriate   Mood/Affect:  Appropriate  Depression Rating:  4 "normal"   Anxiety Rating:  1-2  Thoughts of Suicide:  No Will you contract for safety?   NA  Current AVH:  No  Plan for Discharge/Comments:  Pt reports that he is feeling well today and plans to return home. Pt has follow-up with [facility] and [facility] in place and has [facility] intake assessment scheduled with Noni SaupeJamie Duvall. Pt given AA list/NA list and mental health association information. Pt reports meds are helping to manage his anxiety.   Transportation Means: friend   Supports: friends, some family, and girlfriend who he lives with.   Smart, American FinancialHeather LCSWA

## 2014-12-23 NOTE — Progress Notes (Signed)
D:  Patient's self inventory sheet, patient slept good last night, sleep medication was helpful.  Good appetite, normal/low energy, good concentration.  Denied depression and hopeless, anxiety 4.  Denied withdrawals.  Denied SI.  Physical problems back pain, headaches in past 24 hours.  Worst pain #3.  Pain medication is helpful.  Trying to keep anxiety under control, work on discharge plan.  Plans to talk to MD, attend groups.  Does have discharge plans.  No problems anticipated after discharge. A:  Medications administered per MD orders.  Emotional support and encouragement given patient. R:  Denied SI and HI, contracts for safety.  Denied A/V hallucinations.  Safety maintained with 15 minute checks.

## 2014-12-23 NOTE — BHH Suicide Risk Assessment (Addendum)
[facility] Discharge Suicide Risk Assessment   Demographic Factors:  Male, Caucasian, Low socioeconomic status and Unemployed  Total Time spent with patient: 30 minutes  Musculoskeletal: Strength & Muscle Tone: within normal limits Gait & Station: normal Patient leans: N/A  Psychiatric Specialty Exam: Physical Exam  Psychiatric: His speech is normal and behavior is normal. Judgment and thought content normal. His mood appears anxious. Cognition and memory are normal.    Review of Systems  Constitutional: Negative.   HENT: Negative.   Eyes: Negative.   Respiratory: Negative.   Cardiovascular: Negative.   Gastrointestinal: Negative.   Genitourinary: Negative.   Musculoskeletal: Negative.   Skin: Negative.   Neurological: Negative.   Endo/Heme/Allergies: Negative.   Psychiatric/Behavioral: The patient is nervous/anxious.        Anxiety rated 4/10 but patient  Still wants to be discharged home    Blood pressure 115/83, pulse 98, temperature 97.5 F (36.4 C), temperature source Oral, resp. rate 16, height  (1.905 m), weight 97.523 kg (215 lb).Body mass index is 26.87 kg/(m^2).  General Appearance: Casual  Eye Contact::  Good  Speech:  Clear and Coherent409  Volume:  Normal  Mood:  Euthymic  Affect:  anxious  Thought Process:  Goal Directed  Orientation:  Full (Time, Place, and Person)  Thought Content:  Negative  Suicidal Thoughts:  No  Homicidal Thoughts:  No  Memory:  Immediate;   Good Recent;   Good Remote;   Good  Judgement:  Fair  Insight:  Fair  Psychomotor Activity:  Normal  Concentration:  Good  Recall:  Good  Fund of Knowledge:Good  Language: Good  Akathisia:  No  Handed:  Right  AIMS (if indicated):     Assets:  Communication Skills Desire for Improvement Physical Health  Sleep:  Number of Hours: 5.75  Cognition: WNL  ADL's:  Intact   Have you used any form of tobacco in the last 30 days? (Cigarettes, Smokeless Tobacco, Cigars, and/or Pipes): Yes   Has this patient used any form of tobacco in the last 30 days? (Cigarettes, Smokeless Tobacco, Cigars, and/or Pipes) Yes, A prescription for an FDA-approved tobacco cessation medication was offered at discharge and the patient accepted.  Mental Status Per Nursing Assessment::   On Admission:  Suicidal ideation indicated by patient, Self-harm thoughts  Current Mental Status by Physician: Patient currently denies suicidal ideation, intent or plan  Loss Factors: Financial problems/change in socioeconomic status  Historical Factors: Impulsivity  Risk Reduction Factors:   Living with another person, especially a relative and Positive social support  Continued Clinical Symptoms:  Resolving mood symptoms.  Cognitive Features That Contribute To Risk:  Closed-mindedness    Suicide Risk:  Minimal: No identifiable suicidal ideation.  Patients presenting with no risk factors but with morbid ruminations; may be classified as minimal risk based on the severity of the depressive symptoms  Principal Problem: Bipolar 1 disorder, depressed, severe Discharge Diagnoses:  Patient Active Problem List   Diagnosis Date Noted  . Bipolar 1 disorder, depressed, severe [F31.4] 12/18/2014  . Agitation [R45.1]   . Substance induced mood disorder [F19.94] 06/15/2014  . Anxiety state, unspecified [F41.1] 08/29/2013  . GAD (generalized anxiety disorder) [F41.1] 08/29/2013  . Panic attacks [F41.0] 08/29/2013  . MDD (major depressive disorder), recurrent episode, severe [F33.2] 08/28/2013  . Alcohol dependence [F10.20] 08/12/2013  . Bipolar I disorder, most recent episode (or current) depressed, severe, without mention of psychotic behavior [F31.4] 08/12/2013    Follow-up Information    Follow up  with [facility] On 01/15/2015.   Why:  Appt on this date at 2:40PM [facility] for medication management. Please cancel this appt if you decide to follow-up with [facility] for medication management.    Contact  information:   [address]  [place], [place] [address] Phone: [phone] Fax: [phone]      Follow up with [facility].   Why:  appt for therapy needed prior to d/c.    Contact information:   [address] [place], [place] [address] Phone: [phone] Fax: [phone]      Follow up with [facility].   Why:  triage appt needed prior to d/c.    Contact information:   [address] [place], [place] [address] Phone: [phone] Fax: [phone]      Plan Of Care/Follow-up recommendations:  Activity:  as tolerated Diet:  healthy  Is patient on multiple antipsychotic therapies at discharge:  No   Has Patient had three or more failed trials of antipsychotic monotherapy by history:  No  Recommended Plan for Multiple Antipsychotic Therapies: NA    Thedore MinsAkintayo, Ahaana Rochette, MD 12/23/2014, 9:23 AM

## 2014-12-25 NOTE — Progress Notes (Signed)
Patient Discharge Instructions:  After Visit Summary (AVS):   Faxed to:  12/25/14 Discharge Summary Note:   Faxed to:  12/25/14 Psychiatric Admission Assessment Note:   Faxed to:  12/25/14 Suicide Risk Assessment - Discharge Assessment:   Faxed to:  12/25/14 Faxed/Sent to the Next Level Care provider:  12/25/14 Faxed to [facility] @ [phone] Faxed to [facility] @ [phone] [facility] to [facility] @ [phone]  Jerelene ReddenSheena E Woodbury, 12/25/2014, 2:42 PM

## 2015-01-18 ENCOUNTER — Emergency Department (HOSPITAL_COMMUNITY)
Admission: EM | Admit: 2015-01-18 | Discharge: 2015-01-19 | Disposition: A | Discharge status: Other Acute Inpatient Hospital | Payer: Self-pay | Attending: Emergency Medicine | Admitting: Emergency Medicine

## 2015-01-18 ENCOUNTER — Encounter (HOSPITAL_COMMUNITY): Payer: Self-pay | Admitting: Emergency Medicine

## 2015-01-18 DIAGNOSIS — R45851 Suicidal ideations: Secondary | ICD-10-CM

## 2015-01-18 DIAGNOSIS — F314 Bipolar disorder, current episode depressed, severe, without psychotic features: Secondary | ICD-10-CM | POA: Diagnosis present

## 2015-01-18 DIAGNOSIS — Z8669 Personal history of other diseases of the nervous system and sense organs: Secondary | ICD-10-CM | POA: Insufficient documentation

## 2015-01-18 DIAGNOSIS — F10230 Alcohol dependence with withdrawal, uncomplicated: Secondary | ICD-10-CM | POA: Diagnosis present

## 2015-01-18 DIAGNOSIS — Z79899 Other long term (current) drug therapy: Secondary | ICD-10-CM | POA: Insufficient documentation

## 2015-01-18 DIAGNOSIS — F419 Anxiety disorder, unspecified: Secondary | ICD-10-CM | POA: Insufficient documentation

## 2015-01-18 DIAGNOSIS — Z720 Tobacco use: Secondary | ICD-10-CM | POA: Insufficient documentation

## 2015-01-18 LAB — COMPREHENSIVE METABOLIC PANEL
ALT: 288 U/L — ABNORMAL HIGH (ref 0–53)
AST: 146 U/L — ABNORMAL HIGH (ref 0–37)
Albumin: 4.4 g/dL (ref 3.5–5.2)
Alkaline Phosphatase: 68 U/L (ref 39–117)
Anion gap: 9 (ref 5–15)
BUN: 16 mg/dL (ref 6–23)
CO2: 25 mmol/L (ref 19–32)
Calcium: 8.9 mg/dL (ref 8.4–10.5)
Chloride: 106 mmol/L (ref 96–112)
Creatinine, Ser: 1.09 mg/dL (ref 0.50–1.35)
GFR calc Af Amer: >90 mL/min (ref >90)
GFR calc non Af Amer: 85 mL/min — ABNORMAL LOW (ref >90)
Glucose, Bld: 94 mg/dL (ref 70–99)
Potassium: 4.3 mmol/L (ref 3.5–5.1)
Sodium: 140 mmol/L (ref 135–145)
TOTAL PROTEIN: 7.7 g/dL (ref 6.0–8.3)
Total Bilirubin: 1.1 mg/dL (ref 0.3–1.2)

## 2015-01-18 LAB — ACETAMINOPHEN LEVEL: Acetaminophen (Tylenol), Serum: <10 ug/mL — ABNORMAL LOW (ref 10–30)

## 2015-01-18 LAB — CBC
HCT: 45.6 % (ref 39.0–52.0)
Hemoglobin: 16.1 g/dL (ref 13.0–17.0)
MCH: 30.1 pg (ref 26.0–34.0)
MCHC: 35.3 g/dL (ref 30.0–36.0)
MCV: 85.4 fL (ref 78.0–100.0)
Platelets: 224 10*3/uL (ref 150–400)
RBC: 5.34 MIL/uL (ref 4.22–5.81)
RDW: 12.9 % (ref 11.5–15.5)
WBC: 12.1 10*3/uL — ABNORMAL HIGH (ref 4.0–10.5)

## 2015-01-18 LAB — SALICYLATE LEVEL

## 2015-01-18 LAB — ETHANOL: Alcohol, Ethyl (B): 133 mg/dL — ABNORMAL HIGH (ref 0–9)

## 2015-01-18 LAB — RAPID URINE DRUG SCREEN, HOSP PERFORMED
Amphetamines: NOT DETECTED
Barbiturates: NOT DETECTED
Benzodiazepines: NOT DETECTED
Cocaine: NOT DETECTED
Opiates: NOT DETECTED
Tetrahydrocannabinol: NOT DETECTED

## 2015-01-18 MED ORDER — LORAZEPAM 1 MG PO TABS
2.0000 mg | ORAL_TABLET | Freq: Once | ORAL | Status: AC
  Administered 2015-01-18: 2 mg via ORAL
  Filled 2015-01-18: qty 2

## 2015-01-18 MED ORDER — ZOLPIDEM TARTRATE 5 MG PO TABS
5.0000 mg | ORAL_TABLET | Freq: Every evening | ORAL | Status: DC | PRN
  Administered 2015-01-18: 5 mg via ORAL
  Filled 2015-01-18: qty 1

## 2015-01-18 MED ORDER — THIAMINE HCL 100 MG/ML IJ SOLN: 100.0000 mg | Freq: Every day | INTRAMUSCULAR | Status: DC

## 2015-01-18 MED ORDER — ACETAMINOPHEN 325 MG PO TABS
650.0000 mg | ORAL_TABLET | ORAL | Status: DC | PRN
Start: 2015-01-18 — End: 2015-01-19
  Administered 2015-01-18: 650 mg via ORAL
  Filled 2015-01-18: qty 2

## 2015-01-18 MED ORDER — VITAMIN B-1 100 MG PO TABS
100.0000 mg | ORAL_TABLET | Freq: Every day | ORAL | Status: DC
  Administered 2015-01-18 – 2015-01-19 (×2): 100 mg via ORAL
  Filled 2015-01-18 (×2): qty 1

## 2015-01-18 MED ORDER — ALUM & MAG HYDROXIDE-SIMETH 200-200-20 MG/5ML PO SUSP: 30.0000 mL | ORAL | Status: DC | PRN

## 2015-01-18 MED ORDER — LORAZEPAM 2 MG/ML IJ SOLN: 0.0000 mg | Freq: Four times a day (QID) | INTRAMUSCULAR | Status: DC

## 2015-01-18 MED ORDER — LORAZEPAM 1 MG PO TABS
0.0000 mg | ORAL_TABLET | Freq: Four times a day (QID) | ORAL | Status: DC
Start: 2015-01-18 — End: 2015-01-19
  Administered 2015-01-19 (×2): 1 mg via ORAL
  Filled 2015-01-18: qty 1
  Filled 2015-01-18: qty 2

## 2015-01-18 MED ORDER — LORAZEPAM 1 MG PO TABS
1.0000 mg | ORAL_TABLET | Freq: Three times a day (TID) | ORAL | Status: DC | PRN
  Administered 2015-01-18: 1 mg via ORAL
  Filled 2015-01-18: qty 1

## 2015-01-18 MED ORDER — LORAZEPAM 1 MG PO TABS: 0.0000 mg | ORAL_TABLET | Freq: Two times a day (BID) | ORAL | Status: DC

## 2015-01-18 MED ORDER — ONDANSETRON HCL 4 MG PO TABS: 4.0000 mg | ORAL_TABLET | Freq: Three times a day (TID) | ORAL | Status: DC | PRN

## 2015-01-18 MED ORDER — LORAZEPAM 2 MG/ML IJ SOLN: 0.0000 mg | Freq: Two times a day (BID) | INTRAMUSCULAR | Status: DC

## 2015-01-18 MED ORDER — NICOTINE 21 MG/24HR TD PT24
21.0000 mg | MEDICATED_PATCH | Freq: Every day | TRANSDERMAL | Status: DC
  Administered 2015-01-18 – 2015-01-19 (×2): 21 mg via TRANSDERMAL
  Filled 2015-01-18 (×2): qty 1

## 2015-01-18 NOTE — H&P (Signed)
PATIENT NAME:  RHYTHM, WIGFALL MR#:  [id number] DATE OF BIRTH:  [birth year]  DATE OF ADMISSION:  06/28/2014  REFERRING PHYSICIAN: Emergency Room MD   ATTENDING PHYSICIAN: Kristine Linea, MD   IDENTIFYING DATA: Mr. Wiegand is a 39 year old male with history of depression, OCD and alcoholism.  CHIEF COMPLAINT: "I was drunk."   HISTORY OF PRESENT ILLNESS: Mr. Costanzo came to the Emergency Room intoxicated, asking for alcohol detox and treatment. During his conversation with the intake people, he disclosed some suicidal ideation while drunk. He no longer feels suicidal, but does report many symptoms of depression with poor sleep, decreased appetite, anhedonia, crying spells, social isolation, and severe anxiety that worsens during the time of depression. In addition to generalized anxiety and panic attacks, he has severe OCD with checking behaviors, shower ritual, excessive worries. He has also been drinking. He has been drinking since the age of 34, but his habit escalated 5 years ago when his brother was killed in an accident. He has been increasingly effected by his drinking, unable to maintain employment, feeling that his health is to suffer. He came to the hospital asking for help. He denies psychotic symptoms, denies symptoms suggestive of bipolar mania. He denies other than alcohol substances.  PAST PSYCHIATRIC HISTORY: Drinking since the age of 41. He was hospitalized once at [facility] for alcohol detox and rehab. He was able to maintain sobriety for 4 years. He wants to do it again. He had 1 suicide attempt by cutting many, many years ago for which he was briefly hospitalized. At some point, he was diagnosed with bipolar disorder and was prescribed Lamictal, but has not been taking it for a long time. He was given Klonopin at some point, that he felt was helpful.  FAMILY PSYCHIATRIC HISTORY: Father with severe anxiety, but no OCD.   ALLERGIES: HALDOL, ZYPREXA, ASPIRIN.  MEDICATIONS ON  ADMISSION: None.   PAST MEDICAL HISTORY: None.   SOCIAL HISTORY: He works in [employer]. He lives with a bunch of guys from work who also drink. He HAS found it increasingly difficult to find a job and maintain employment.   REVIEW OF SYSTEMS: CONSTITUTIONAL: No fevers or chills. No weight changes.  EYES: No double or blurred vision.  ENT: No hearing loss.  RESPIRATORY: No shortness of breath or cough.  CARDIOVASCULAR: No chest pain or orthopnea.  GASTROINTESTINAL: No abdominal pain, nausea, vomiting, or diarrhea.  GENITOURINARY: No incontinence or frequency.  ENDOCRINE: No heat or cold intolerance.  LYMPHATIC: No anemia or easy bruising.  INTEGUMENTARY: No acne or rash.  MUSCULOSKELETAL: No muscle or joint pain.  NEUROLOGIC: No tingling or weakness.  PSYCHIATRIC: See history of present illness for details.   PHYSICAL EXAMINATION: VITAL SIGNS: Blood pressure 128/76, pulse 91, respirations 20, temperature 97.7.  GENERAL: This is a well-developed young male in no acute distress.  HEENT: The pupils are equal, round, and reactive to light. Sclerae are anicteric.  NECK: Supple. No thyromegaly.  LUNGS: Clear to auscultation. No dullness to percussion.  HEART: Regular rhythm and rate. No murmurs, rubs, or gallops.  ABDOMEN: Soft, nontender, nondistended. Positive bowel sounds.  MUSCULOSKELETAL: Normal muscle strength in all extremities.  SKIN: No rashes or bruises.  LYMPHATIC: No cervical adenopathy.  NEUROLOGIC: Cranial nerves II through XII are intact.   LABORATORY DATA: Chemistries are within normal limits. Blood alcohol level is zero. LFTs with elevated AST 78 and ALT 244. Urine tox screen negative for substances. CBC within normal limits. Urinalysis is not suggestive of urinary  tract infection. Serum acetaminophen and salicylates are low.   MENTAL STATUS EXAMINATION ON ADMISSION: The patient is alert and oriented to person, place, time and situation. He is pleasant, polite and  cooperative. He is well groomed and casually dressed. He maintains good eye contact. His speech is of normal rhythm, rate and volume. Mood is depressed with anxious affect. Thought process is logical and goal oriented. Thought content: He denies suicidal or homicidal ideation even though expressed some thoughts of hurting himself on admission. There are no delusions or paranoia. There are no auditory or visual hallucinations. His cognition is grossly intact. Registration, recall, short and long memory are intact. He is of average fund of knowledge and average intelligence. His insight and judgment are fair.   SUICIDE RISK ASSESSMENT ON ADMISSION: This is a patient with long history of alcoholism who came to the hospital asking for alcohol treatment.   INITIAL DIAGNOSES: 1.  Alcohol-induced depressive disorder. 2.  Alcohol use disorder, severe. 3.  Obsessive-compulsive disorder.  AXIS II: Deferred.  AXIS III: Deferred.   PLAN: The patient was [facility] unit for safety, stabilization and medication management. He was initially placed on suicide precautions and was closely monitored for any unsafe behavior. He underwent full psychiatric and risk assessment. He received pharmacotherapy, individual and group psychotherapy, substance abuse counseling, and support from therapeutic milieu.  1.  Suicidal ideation: This has resolved. The patient is able to contract for safety.  2.  Alcohol detox: He was started on CIWA protocol. I will add Librium 25 mg q. 6 hours for 3 days to help detox. 3.  Depression and anxiety: He suffers severe obsessive compulsive disorder and depression. We will start Luvox 50 mg tonight, increase the dose to 100 tomorrow and try to titrate to target dose of 150 to 200 mg.  4.  Substance abuse treatment: The patient is very much interested in going back to [facility]. Social worker to make a referral.  5.  Disposition: To be  established.  ____________________________ Ellin GoodieJolanta B. Jennet MaduroPucilowska, MD jbp:[id number] D: 06/28/2014 16:32:26 ET T: 06/28/2014 16:57:05 ET JOB#: [id number]  cc: Lorance Pickeral B. Jennet MaduroPucilowska, MD, <Dictator> Shari ProwsJOLANTA B Ahtziry Saathoff MD ELECTRONICALLY SIGNED 07/04/2014 0:54

## 2015-01-18 NOTE — Consult Note (Signed)
PATIENT NAME:  [name], Anwar M MR#:  [id number] DATE OF BIRTH:  03-09-1976  DATE OF CONSULTATION:  06/27/2014  REFERRING PHYSICIAN:   CONSULTING PHYSICIAN:  Audery AmelJohn T. Giovan Pinsky, MD  IDENTIFYING INFORMATION AND REASON FOR CONSULTATION: A 39 year old man with a history of alcohol abuse, comes in the hospital with a chief complaint of suicidal thoughts.   HISTORY OF PRESENT ILLNESS: Information obtained from the patient and the chart. Says for the last 2 or 3 days he has been having suicidal thoughts, thinking that he wants to die, feeling very depressed. He stopped drinking about 2 days ago and has been feeling very anxious ever since. He recently got laid off of work. Has chronic estrangement from his family. Feels like he has no social support. Mood had been feeling depressed and down. Energy level low, sleep poor. He denies that he is abusing any other drugs. Prior to quitting 2 days ago he says he was drinking about a case of beer a day. Not currently on any medicine, used to take Lamictal and trazodone, has been off it for an unknown amount of time. Major stresses include being laid off, he does not really have a clear place to stay right now either.   PAST PSYCHIATRIC HISTORY: Says he has a history of seizures and DTs in the past. Also has a history of being diagnosed with bipolar disorder. Denies that he has ever made a serious suicide attempt. Denies any history of violence. Has been on medications in the past, most recently Lamictal, but he cannot remember how long ago it has been. He has had psychiatric hospitalizations in the past.   SOCIAL HISTORY: Has been staying in [place] with a couple of friends, but they drink and use drugs and he does not feel safe going back there. Not in regular contact with his family. Had been working, but got laid off a couple of days ago.   PAST MEDICAL HISTORY: History of withdrawal seizures, but no history of any other known ongoing medical problems.   FAMILY  HISTORY: Knows of no family history of substance abuse problems.   CURRENT MEDICATIONS: None.   ALLERGIES: ASPIRIN, HALDOL, AND ZYPREXA.   REVIEW OF SYSTEMS: Endorses anxiety, depression, and suicidal ideation. Denies hallucinations. Denies any acute pain. He is feeling jittery, feeling a little bit sick to his stomach. The rest of the whole 9 category review of systems is negative.   MENTAL STATUS EXAMINATION: Disheveled, tremulous man who looks his stated age or older, cooperative with the interview. Eye contact intermittent. Psychomotor activity slow and sluggish. Speech is slurred, difficult to understand. Affect blunted and anxious. Mood stated as nervous. Thoughts are lucid without any obvious loosening of associations, but little bit slow. Denies auditory or visual hallucinations. Endorses suicidal thoughts without specific plan. No homicidal ideation. Can remember 3 out of 3 objects immediately, 2 out of 3 at 3 minutes. He is alert and oriented x 4, seems to have a normal fund of knowledge. Insight and judgment adequate.   VITAL SIGNS: Most recent blood pressure 139/78, pulse 98, respirations 18, temperature 97.8. The patient is able to walk unassisted, use all extremities, has a mild tremor diffusely at rest and motion.   LABORATORY RESULTS: Salicylates and acetaminophen negative. Alcohol level negative. Chemistry panel, elevated chloride at 111, elevated glucose 110. CBC normal. Urinalysis positive for blood, no infection. Drug screen negative.   ASSESSMENT: A 39 year old man with a history of alcohol abuse and depression, still a little tremulous, probably  at risk for either seizures or delirium tremens, needs hospitalization for suicidality and alcohol withdrawal.   TREATMENT PLAN: Suicide precautions, gait precautions, seizure precautions, and fall precautions in place. Admit to psychiatry. Alcohol withdrawal orders in place. Education and supportive therapy done.   DIAGNOSIS PRINCIPAL  AND PRIMARY:   AXIS I: Major depression, moderate, recurrent.   SECONDARY DIAGNOSES:   AXIS I:  1.  Alcohol abuse.  2.  Substance-induced mood disorder.    AXIS II: Deferred.   AXIS III:  No known diagnosis.     ____________________________ Audery Amel, MD jtc:bu D: 06/27/2014 17:27:15 ET T: 06/27/2014 17:40:55 ET JOB#: [id number]  cc: Audery Amel, MD, <Dictator> Audery Amel MD ELECTRONICALLY SIGNED 07/04/2014 16:34

## 2015-01-18 NOTE — ED Notes (Signed)
Pt. Noted in room. No complaints or concerns voiced. No distress or abnormal behavior noted. Will continue to monitor with security cameras. Q 15 minute rounds continue. 

## 2015-01-18 NOTE — ED Notes (Signed)
Pt. Noted resting in room. No complaints or concerns voiced. No distress or abnormal behavior noted. Will continue to monitor with security cameras. Q 15 minute rounds continue. 
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
## 2015-01-18 NOTE — ED Notes (Signed)
Pt. Noted sleeping in room. No complaints or concerns voiced. No distress or abnormal behavior noted. Will continue to monitor with security cameras. Q 15 minute rounds continue. 

## 2015-01-18 NOTE — ED Notes (Signed)
Pt. C/o increased anxiety related to another loud patient.
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
## 2015-01-18 NOTE — Progress Notes (Signed)
Pt referral faxed to the following facilities who report they are accepting referrals or have bed availability:  [facility]  Will continue seeking placement.  Chad CordialLauren Carter, LCSWA 01/18/2015 5:12 PM

## 2015-01-18 NOTE — ED Notes (Signed)
Pt. C/o insomnia. 

## 2015-01-18 NOTE — ED Notes (Signed)
Pt states he is suicidal with plan to cut wrists.  Very superficial cut noted to inner arm.  Also wants detox from [facility].  Normally drinks appx 12 pack/day.  Has had about 6 beers today.  Denies HI.  Denies drugs.

## 2015-01-18 NOTE — ED Notes (Signed)
Report received from Lizbeth BarkJanie Rambo RN. Pt. Alert and oriented in no distress denies HI, AVH and pain.  Pt. States he still has SI to cut himself.Pt. Instructed to come to me with problems or concerns.Will continue to monitor for safety via security cameras and Q 15 minute checks.

## 2015-01-19 ENCOUNTER — Encounter (HOSPITAL_COMMUNITY): Payer: Self-pay | Admitting: *Deleted

## 2015-01-19 ENCOUNTER — Inpatient Hospital Stay (HOSPITAL_COMMUNITY)
Admission: AD | Admit: 2015-01-19 | Discharge: 2015-01-22 | DRG: 885 | Disposition: A | Discharge status: Home / Self Care | Payer: Federal, State, Local not specified - Other | Attending: Psychiatry | Admitting: Psychiatry

## 2015-01-19 DIAGNOSIS — F10280 Alcohol dependence with alcohol-induced anxiety disorder: Secondary | ICD-10-CM | POA: Diagnosis present

## 2015-01-19 DIAGNOSIS — F10229 Alcohol dependence with intoxication, unspecified: Secondary | ICD-10-CM | POA: Diagnosis present

## 2015-01-19 DIAGNOSIS — F17210 Nicotine dependence, cigarettes, uncomplicated: Secondary | ICD-10-CM | POA: Diagnosis present

## 2015-01-19 DIAGNOSIS — F10230 Alcohol dependence with withdrawal, uncomplicated: Secondary | ICD-10-CM | POA: Diagnosis present

## 2015-01-19 DIAGNOSIS — F1190 Opioid use, unspecified, uncomplicated: Secondary | ICD-10-CM | POA: Diagnosis not present

## 2015-01-19 DIAGNOSIS — F10939 Alcohol use, unspecified with withdrawal, unspecified: Secondary | ICD-10-CM | POA: Diagnosis present

## 2015-01-19 DIAGNOSIS — F10239 Alcohol dependence with withdrawal, unspecified: Secondary | ICD-10-CM | POA: Diagnosis present

## 2015-01-19 DIAGNOSIS — F1121 Opioid dependence, in remission: Secondary | ICD-10-CM | POA: Diagnosis present

## 2015-01-19 DIAGNOSIS — Y906 Blood alcohol level of 120-199 mg/100 ml: Secondary | ICD-10-CM | POA: Diagnosis present

## 2015-01-19 DIAGNOSIS — F410 Panic disorder [episodic paroxysmal anxiety] without agoraphobia: Secondary | ICD-10-CM | POA: Diagnosis present

## 2015-01-19 DIAGNOSIS — F411 Generalized anxiety disorder: Secondary | ICD-10-CM | POA: Diagnosis present

## 2015-01-19 DIAGNOSIS — F314 Bipolar disorder, current episode depressed, severe, without psychotic features: Secondary | ICD-10-CM

## 2015-01-19 DIAGNOSIS — R45851 Suicidal ideations: Secondary | ICD-10-CM | POA: Diagnosis present

## 2015-01-19 DIAGNOSIS — F19980 Other psychoactive substance use, unspecified with psychoactive substance-induced anxiety disorder: Secondary | ICD-10-CM | POA: Diagnosis present

## 2015-01-19 DIAGNOSIS — F10980 Alcohol use, unspecified with alcohol-induced anxiety disorder: Secondary | ICD-10-CM | POA: Diagnosis not present

## 2015-01-19 DIAGNOSIS — F1020 Alcohol dependence, uncomplicated: Secondary | ICD-10-CM | POA: Diagnosis present

## 2015-01-19 DIAGNOSIS — F322 Major depressive disorder, single episode, severe without psychotic features: Principal | ICD-10-CM | POA: Diagnosis present

## 2015-01-19 DIAGNOSIS — F1321 Sedative, hypnotic or anxiolytic dependence, in remission: Secondary | ICD-10-CM | POA: Diagnosis present

## 2015-01-19 MED ORDER — QUETIAPINE FUMARATE 25 MG PO TABS: 25.0000 mg | ORAL_TABLET | Freq: Three times a day (TID) | ORAL | Status: DC

## 2015-01-19 MED ORDER — LORAZEPAM 1 MG PO TABS
0.0000 mg | ORAL_TABLET | Freq: Two times a day (BID) | ORAL | Status: DC
Start: 2015-01-20 — End: 2015-01-19

## 2015-01-19 MED ORDER — PROPRANOLOL HCL 10 MG PO TABS: 10.0000 mg | ORAL_TABLET | Freq: Three times a day (TID) | ORAL | Status: DC

## 2015-01-19 MED ORDER — THIAMINE HCL 100 MG/ML IJ SOLN: 100.0000 mg | Freq: Every day | INTRAMUSCULAR | Status: DC

## 2015-01-19 MED ORDER — ACETAMINOPHEN 325 MG PO TABS: 650.0000 mg | ORAL_TABLET | ORAL | Status: DC | PRN

## 2015-01-19 MED ORDER — LORAZEPAM 1 MG PO TABS
1.0000 mg | ORAL_TABLET | Freq: Three times a day (TID) | ORAL | Status: DC | PRN
Start: 2015-01-19 — End: 2015-01-22

## 2015-01-19 MED ORDER — TRAZODONE HCL 50 MG PO TABS
50.0000 mg | ORAL_TABLET | Freq: Every evening | ORAL | Status: DC | PRN
  Administered 2015-01-19: 50 mg via ORAL
  Filled 2015-01-19: qty 1
  Filled 2015-01-19: qty 14

## 2015-01-19 MED ORDER — LORAZEPAM 1 MG PO TABS
1.0000 mg | ORAL_TABLET | Freq: Two times a day (BID) | ORAL | Status: DC
  Administered 2015-01-22: 1 mg via ORAL
  Filled 2015-01-19: qty 1

## 2015-01-19 MED ORDER — NICOTINE 21 MG/24HR TD PT24
21.0000 mg | MEDICATED_PATCH | Freq: Every day | TRANSDERMAL | Status: DC
  Administered 2015-01-20 – 2015-01-22 (×3): 21 mg via TRANSDERMAL
  Filled 2015-01-19 (×4): qty 1

## 2015-01-19 MED ORDER — LORAZEPAM 1 MG PO TABS
1.0000 mg | ORAL_TABLET | Freq: Three times a day (TID) | ORAL | Status: AC
  Administered 2015-01-20 – 2015-01-21 (×3): 1 mg via ORAL
  Filled 2015-01-19 (×3): qty 1

## 2015-01-19 MED ORDER — PROPRANOLOL HCL 10 MG PO TABS
10.0000 mg | ORAL_TABLET | Freq: Three times a day (TID) | ORAL | Status: DC
  Administered 2015-01-19: 10 mg via ORAL
  Filled 2015-01-19 (×3): qty 1

## 2015-01-19 MED ORDER — ACETAMINOPHEN 325 MG PO TABS
650.0000 mg | ORAL_TABLET | Freq: Four times a day (QID) | ORAL | Status: DC | PRN
  Administered 2015-01-19 – 2015-01-22 (×3): 650 mg via ORAL
  Filled 2015-01-19 (×3): qty 2

## 2015-01-19 MED ORDER — LORAZEPAM 1 MG PO TABS: 0.0000 mg | ORAL_TABLET | Freq: Four times a day (QID) | ORAL | Status: DC

## 2015-01-19 MED ORDER — MAGNESIUM HYDROXIDE 400 MG/5ML PO SUSP: 30.0000 mL | Freq: Every day | ORAL | Status: DC | PRN

## 2015-01-19 MED ORDER — LORAZEPAM 1 MG PO TABS: 1.0000 mg | ORAL_TABLET | Freq: Every day | ORAL | Status: DC

## 2015-01-19 MED ORDER — VITAMIN B-1 100 MG PO TABS: 100.0000 mg | ORAL_TABLET | Freq: Every day | ORAL | Status: DC

## 2015-01-19 MED ORDER — QUETIAPINE FUMARATE 50 MG PO TABS
75.0000 mg | ORAL_TABLET | Freq: Three times a day (TID) | ORAL | Status: DC
  Administered 2015-01-19 – 2015-01-22 (×8): 75 mg via ORAL
  Filled 2015-01-19 (×14): qty 1

## 2015-01-19 MED ORDER — ONDANSETRON 4 MG PO TBDP: 4.0000 mg | ORAL_TABLET | Freq: Four times a day (QID) | ORAL | Status: DC | PRN

## 2015-01-19 MED ORDER — ALUM & MAG HYDROXIDE-SIMETH 200-200-20 MG/5ML PO SUSP: 30.0000 mL | ORAL | Status: DC | PRN

## 2015-01-19 MED ORDER — HYDROXYZINE HCL 25 MG PO TABS
25.0000 mg | ORAL_TABLET | Freq: Four times a day (QID) | ORAL | Status: DC | PRN
  Administered 2015-01-19 – 2015-01-21 (×2): 25 mg via ORAL
  Filled 2015-01-19: qty 30
  Filled 2015-01-19 (×2): qty 1

## 2015-01-19 MED ORDER — THIAMINE HCL 100 MG/ML IJ SOLN: 100.0000 mg | Freq: Once | INTRAMUSCULAR | Status: DC

## 2015-01-19 MED ORDER — ADULT MULTIVITAMIN W/MINERALS CH
1.0000 | ORAL_TABLET | Freq: Every day | ORAL | Status: DC
  Administered 2015-01-19 – 2015-01-22 (×4): 1 via ORAL
  Filled 2015-01-19 (×6): qty 1

## 2015-01-19 MED ORDER — LORAZEPAM 1 MG PO TABS
1.0000 mg | ORAL_TABLET | Freq: Four times a day (QID) | ORAL | Status: AC
  Administered 2015-01-19 – 2015-01-20 (×4): 1 mg via ORAL
  Filled 2015-01-19 (×4): qty 1

## 2015-01-19 MED ORDER — VITAMIN B-1 100 MG PO TABS
100.0000 mg | ORAL_TABLET | Freq: Every day | ORAL | Status: DC
  Administered 2015-01-20 – 2015-01-22 (×3): 100 mg via ORAL
  Filled 2015-01-19 (×4): qty 1

## 2015-01-19 MED ORDER — LOPERAMIDE HCL 2 MG PO CAPS
2.0000 mg | ORAL_CAPSULE | ORAL | Status: DC | PRN
  Administered 2015-01-19: 4 mg via ORAL
  Filled 2015-01-19: qty 2

## 2015-01-19 MED ORDER — QUETIAPINE FUMARATE 25 MG PO TABS
25.0000 mg | ORAL_TABLET | Freq: Three times a day (TID) | ORAL | Status: DC
  Administered 2015-01-19: 25 mg via ORAL
  Filled 2015-01-19: qty 1

## 2015-01-19 MED ORDER — PROPRANOLOL HCL 20 MG PO TABS
20.0000 mg | ORAL_TABLET | Freq: Three times a day (TID) | ORAL | Status: DC
  Administered 2015-01-19 – 2015-01-22 (×8): 20 mg via ORAL
  Filled 2015-01-19 (×11): qty 1
  Filled 2015-01-19: qty 2

## 2015-01-19 MED ORDER — QUETIAPINE FUMARATE 100 MG PO TABS: 150.0000 mg | ORAL_TABLET | Freq: Every day | ORAL | Status: DC

## 2015-01-19 MED ORDER — ONDANSETRON HCL 4 MG PO TABS: 4.0000 mg | ORAL_TABLET | Freq: Three times a day (TID) | ORAL | Status: DC | PRN

## 2015-01-19 MED ORDER — ALUM & MAG HYDROXIDE-SIMETH 200-200-20 MG/5ML PO SUSP
30.0000 mL | ORAL | Status: DC | PRN
Start: 2015-01-19 — End: 2015-01-22
  Administered 2015-01-20 (×2): 30 mL via ORAL
  Filled 2015-01-19 (×2): qty 30

## 2015-01-19 MED ORDER — QUETIAPINE FUMARATE 100 MG PO TABS
150.0000 mg | ORAL_TABLET | Freq: Every day | ORAL | Status: DC
  Administered 2015-01-19: 150 mg via ORAL
  Filled 2015-01-19 (×5): qty 1

## 2015-01-19 NOTE — ED Notes (Signed)
Pt. Noted sleeping in room. No complaints or concerns voiced. No distress or abnormal behavior noted. Will continue to monitor with security cameras. Q 15 minute rounds continue. 
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
## 2015-01-19 NOTE — Tx Team (Signed)
Initial Interdisciplinary Treatment Plan   PATIENT STRESSORS: Substance abuse   PATIENT STRENGTHS: Ability for insight Active sense of humor Average or above average intelligence Capable of independent living Communication skills   PROBLEM LIST: Problem List/Patient Goals Date to be addressed Date deferred Reason deferred Estimated date of resolution  Substance Abuse 01/19/15     Depression 2' to SA 01/19/15     Suicidal Ideation 2' Depression 01/19/15     Depression 2' Bipolar 01/19/15                                     DISCHARGE CRITERIA:  Ability to meet basic life and health needs Adequate post-discharge living arrangements Improved stabilization in mood, thinking, and/or behavior Medical problems require only outpatient monitoring Motivation to continue treatment in a less acute level of care Need for constant or close observation no longer present  PRELIMINARY DISCHARGE PLAN: Attend aftercare/continuing care group Attend PHP/IOP Attend 12-step recovery group Outpatient therapy Participate in family therapy Placement in alternative living arrangements  PATIENT/FAMIILY INVOLVEMENT: This treatment plan has been presented to and reviewed with the patient, Ailene RavelJason M Szeto, and/or family member, .  The patient and family have been given the opportunity to ask questions and make suggestions.  Rich BraveDuke, Majorie Santee Lynn 01/19/2015, 4:34 PM

## 2015-01-19 NOTE — ED Notes (Signed)
[facility].  Belongings given to [facility].

## 2015-01-19 NOTE — ED Notes (Signed)
Up to the bathroom 
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
## 2015-01-19 NOTE — ED Notes (Signed)
On the phone 

## 2015-01-19 NOTE — ED Notes (Signed)
Up tot he bathroom to shower and change scrubs 

## 2015-01-19 NOTE — ED Notes (Addendum)
Pt. Noted resting in room. No complaints or concerns voiced. No distress or abnormal behavior noted. Will continue to monitor with security cameras. Q 15 minute rounds continue. 

## 2015-01-19 NOTE — Progress Notes (Signed)
Patient has been up in the dayroom playing cards with peers, laughing and talking. He attended AA group tonight and reports that he enjoyed it. He reports that he came back in because he was having thoughts to harm himself and drinking. He reports that he needs to find something to do because he thinks too much and ends up drinking. He denies si/hi/a/v hallucinations. Support and encouragent given, safety maintained on unit with 15 min checks.

## 2015-01-19 NOTE — ED Notes (Signed)
[facility] is here to transport 

## 2015-01-19 NOTE — ED Notes (Signed)
[facility] contacted for transport 

## 2015-01-20 ENCOUNTER — Encounter (HOSPITAL_COMMUNITY): Payer: Self-pay | Admitting: Psychiatry

## 2015-01-20 DIAGNOSIS — F19980 Other psychoactive substance use, unspecified with psychoactive substance-induced anxiety disorder: Secondary | ICD-10-CM | POA: Diagnosis present

## 2015-01-20 DIAGNOSIS — F1020 Alcohol dependence, uncomplicated: Secondary | ICD-10-CM | POA: Diagnosis present

## 2015-01-20 DIAGNOSIS — F10939 Alcohol use, unspecified with withdrawal, unspecified: Secondary | ICD-10-CM

## 2015-01-20 DIAGNOSIS — F10239 Alcohol dependence with withdrawal, unspecified: Secondary | ICD-10-CM

## 2015-01-20 DIAGNOSIS — F322 Major depressive disorder, single episode, severe without psychotic features: Principal | ICD-10-CM

## 2015-01-20 HISTORY — DX: Alcohol use, unspecified with withdrawal, unspecified: F10.939

## 2015-01-20 HISTORY — DX: Alcohol dependence with withdrawal, unspecified: F10.239

## 2015-01-20 HISTORY — DX: Alcohol dependence, uncomplicated: F10.20

## 2015-01-20 MED ORDER — QUETIAPINE FUMARATE 200 MG PO TABS
200.0000 mg | ORAL_TABLET | Freq: Every day | ORAL | Status: DC
  Administered 2015-01-20 – 2015-01-21 (×2): 200 mg via ORAL
  Filled 2015-01-20 (×3): qty 1

## 2015-01-20 NOTE — [facility] Group Notes (Signed)
[facility] After[facility] Discharge Planning Group Note   01/20/2015 10:25 AM    Participation Quality:  Appropraite  Mood/Affect:  Appropriate  Depression Rating:    Anxiety Rating:    Thoughts of Suicide:  No  Will you contract for safety?   NA  Current AVH:  No  Plan for Discharge/Comments:  Patient attended discharge planning group and actively participated in group. Patient shared the obstacle he has to overcome is alcohol. He advised of having periods of sobriety by attending groups and staying in contact with his sponsor.  He shared he has to make a decision to call his sponsor rather than deciding to drink.   Transportation Means: Patient has transportation.   Supports:  Patient has a support system.   Francis Yardley, [name] JulyQuylle Hairston

## 2015-01-20 NOTE — [facility] Suicide Risk Assessment (Addendum)
[facility] Admission Suicide Risk Assessment   Nursing information obtained from:    Demographic factors:    Current Mental Status:    Loss Factors:    Historical Factors:    Risk Reduction Factors:    Total Time spent with patient: 30 minutes Principal Problem: Alcohol use disorder, severe, dependence Diagnosis:   Patient Active Problem List   Diagnosis Date Noted  . Alcohol use disorder, severe, dependence [F10.20] 01/20/2015  . Alcohol withdrawal without perceptual disturbances [F10.239] 01/20/2015  . 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 Anxiety state, unspecified [F41.1] 08/29/2013  . Panic attacks [F41.0] 08/29/2013     Continued Clinical Symptoms:  Alcohol Use Disorder Identification Test Final Score (AUDIT): 26 The "Alcohol Use Disorders Identification Test", Guidelines for Use in Primary Care, Second Edition.  World Science writer [facility]). Score between 0-7:  no or low risk or alcohol related problems. Score between 8-15:  moderate risk of alcohol related problems. Score between 16-19:  high risk of alcohol related problems. Score 20 or above:  warrants further diagnostic evaluation for alcohol dependence and treatment.   CLINICAL FACTORS:   Panic Attacks Alcohol/Substance Abuse/Dependencies Unstable or Poor Therapeutic Relationship Previous Psychiatric Diagnoses and Treatments   Musculoskeletal: Strength & Muscle Tone: within normal limits Gait & Station: normal Patient leans: N/A  Psychiatric Specialty Exam: Physical Exam  Review of Systems  Gastrointestinal: Positive for nausea and diarrhea.  Psychiatric/Behavioral: Positive for depression. The patient is nervous/anxious.     Blood pressure 129/81, pulse 94, temperature 98.7 F (37.1 C), temperature source Oral, resp. rate 18, height 6' 1.75" (1.873 m), weight 96.616 kg (213 lb).Body mass index is 27.54 kg/(m^2).  General Appearance: Fairly Groomed  Patent attorney::  Fair  Speech:  Clear  and Coherent  Volume:  Normal  Mood:  Anxious  Affect:  Congruent  Thought Process:  Coherent  Orientation:  Full (Time, Place, and Person)  Thought Content:  Paranoid Ideation and Rumination  Suicidal Thoughts:  No PRESENTED WITH SI  Homicidal Thoughts:  No  Memory:  Immediate;   Fair Recent;   Fair Remote;   Fair  Judgement:  Impaired  Insight:  Shallow  Psychomotor Activity:  Normal  Concentration:  Poor  Recall:  [employer] of Knowledge:Fair  Language: Fair  Akathisia:  No  Handed:  Right  AIMS (if indicated):     Assets:  Communication Skills Desire for Improvement  Sleep:  Number of Hours: 5.75  Cognition: WNL  ADL's:  Intact     COGNITIVE FEATURES THAT CONTRIBUTE TO RISK:  Polarized thinking    SUICIDE RISK:   Moderate:  Frequent suicidal ideation with limited intensity, and duration, some specificity in terms of plans, no associated intent, good self-control, limited dysphoria/symptomatology, some risk factors present, and identifiable protective factors, including available and accessible social support.  PLAN OF CARE: Patient will benefit from inpatient treatment and stabilization.  Estimated length of stay is 5-7 days.  Reviewed past medical records,treatment plan. Patient has failed trials of  several medications in the past including prozac, paxil,zoloft,effexor,cymbalta,luvox,celexa,lexapro,klonopin . Continue CIWA/Ativan protocol. Will increase Seroquel to 200 mg po qhs for sleep as well as mood sx. Will continue Seroquel 75 mg po tid . Will continue Propranolol 20 mg po tid for anxiety/impulsivity. Trazodone po prn qhs for sleep at bedtime. Will continue to monitor vitals ,medication compliance and treatment side effects while patient is here.  Will monitor for medical issues as well as call consult as needed.  Reviewed labs ,  will order as needed.  CSW will start working on disposition. [facility] would [facility]. Patient to  participate in therapeutic milieu .       Medical Decision Making:  Review of Psycho-Social Stressors (1), Review or order clinical lab tests (1), Established Problem, Worsening (2), Review of Medication Regimen & Side Effects (2) and Review of New Medication or Change in Dosage (2)  I certify that inpatient services furnished can reasonably be expected to improve the patient's condition.   Shakil Dirk MD 01/20/2015, 2:38 PM
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
## 2015-01-20 NOTE — Progress Notes (Signed)
D:  Patient's self inventory sheet, patient slept good last night, sleep medication is helpful.  Good appetite, normal energy level, poor concentration.  Rated depression 3, anxiety 5, no hopelessness.  Has experienced upset stomach, had some diarrhea.  Headache in the past 24 hours.  Backache.  Pain medication is helpful.  Goal is to get meds regulated and to [facility] to people [facility].  [facility] to [facility].   A:  Medications administered per MD orders.  Emotional support and encouragement given patient. R:  Denied SI and HI, contracts for safety.  Denied A/V hallucinations.  Denied pain.  Safety maintained with 15 minute checks.

## 2015-01-20 NOTE — Plan of Care (Signed)
Problem: Consults Goal: Anxiety Disorder Patient Education See Patient Education Module for eduction specifics.  Outcome: Completed/Met Date Met:  01/20/15 Nurse discussed anxiety/coping skills with patient.

## 2015-01-20 NOTE — Plan of Care (Signed)
Problem: Alteration in mood & ability to function due to Goal: STG-Pt will be introduced to the 12-step program of recovery (Patient will be introduced to the 12-step program of recovery and disease concept of addiction)  Outcome: Progressing Patient attended AA group this evening.     

## 2015-01-20 NOTE — Progress Notes (Signed)
Patient ID: Steven RavelJason M [name], male   DOB: 1975/10/29, 39 y.o.   MRN: [id number] D: Client visible on the unit, seen in dayroom and on the phone. Client reports day "been pretty good", anxiety at "4" of 10, "that low for me" Client reports he was admitted because "I needed to get calm, need to get regulated on my medication" A: Writer provided emotional support, introduced self to client, reviewed medications and administered as ordered. Client encouraged to report any side effect of medications. Staff will monitor q4015min for safety. R: Client is safe on the unit, attended group.

## 2015-01-20 NOTE — [facility] Suicide Risk Assessment (Signed)
[facility] INPATIENT:  Family/Significant Other Suicide Prevention Education  Suicide Prevention Education:  Patient Refusal for Family/Significant Other Suicide Prevention Education: The patient Ailene RavelJason M Remillard has refused to provide written consent for family/significant other to be provided Family/Significant Other Suicide Prevention Education during admission and/or prior to discharge.  Physician notified.  Wynn BankerHodnett, Zvi Duplantis Hairston 01/20/2015, 4:10 PM

## 2015-01-20 NOTE — Progress Notes (Signed)
Recreation Therapy Notes  Date: 04.25.2016 Time: 9:30am Location: [address] Dayroom   Group Topic: Stress Management  Goal Area(s) Addresses:  Patient will actively participate in stress management techniques presented during session.   Behavioral Response: Engaged, Appropriate   Intervention: Stress management techniques  Activity :  Deep Breathing and Guided Visualization. LRT provided instruction and demonstration on practice of Guided Visualization. Technique was coupled with deep breathing.   Education:  Stress Management, Discharge Planning.   Education Outcome: Acknowledges education  Clinical Observations/Feedback: Patient actively participated in presenting technique, patient displayed no difficulty and expressed ability to practice post d/c.    Marykay Lexenise L Nylia Gavina, LRT/CTRS  Jearl KlinefelterBlanchfield, Tiffiney Sparrow L 01/20/2015 1:59 PM

## 2015-01-20 NOTE — Progress Notes (Signed)
Pt attended group on [address] this evening.   Tomi BambergerMariya Vesper Trant, MHT
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
## 2015-01-21 DIAGNOSIS — F1121 Opioid dependence, in remission: Secondary | ICD-10-CM | POA: Diagnosis present

## 2015-01-21 DIAGNOSIS — F10229 Alcohol dependence with intoxication, unspecified: Secondary | ICD-10-CM | POA: Diagnosis present

## 2015-01-21 DIAGNOSIS — F1190 Opioid use, unspecified, uncomplicated: Secondary | ICD-10-CM

## 2015-01-21 DIAGNOSIS — F10980 Alcohol use, unspecified with alcohol-induced anxiety disorder: Secondary | ICD-10-CM

## 2015-01-21 DIAGNOSIS — F10280 Alcohol dependence with alcohol-induced anxiety disorder: Secondary | ICD-10-CM

## 2015-01-21 DIAGNOSIS — F1321 Sedative, hypnotic or anxiolytic dependence, in remission: Secondary | ICD-10-CM | POA: Diagnosis present

## 2015-01-21 HISTORY — DX: Opioid dependence, in remission: F11.21

## 2015-01-21 LAB — LIPID PANEL
CHOLESTEROL: 170 mg/dL (ref 0–200)
HDL: 50 mg/dL (ref >39)
LDL CALC: 89 mg/dL (ref 0–99)
TRIGLYCERIDES: 157 mg/dL — AB (ref <150)
Total CHOL/HDL Ratio: 3.4 RATIO
VLDL: 31 mg/dL (ref 0–40)

## 2015-01-21 NOTE — Plan of Care (Signed)
Problem: Consults Goal: Suicide Risk Patient Education (See Patient Education module for education specifics)  Outcome: Completed/Met Date Met:  01/21/15 Nurse discussed SI thoughts/coping skills with patient.

## 2015-01-21 NOTE — [facility] Group Notes (Signed)
The focus of this group is to educate the patient on the purpose and policies of crisis stabilization and provide a format to answer questions about their admission.  The group details unit policies and expectations of patients while admitted.  Patient attended 0900 nurse education orientation group this morning.  Patient actively participated, appropriate affect, alert, appropriate insight and engagement.  Today patient will work on 3 goals for discharge.  

## 2015-01-21 NOTE — Progress Notes (Signed)
D:  Patient's self inventory sheet, patient sleeps good, sleep medication is helpful.  Good appetite, normal energy level, poor concentration.  Rated depression and hopeless 1, anxiety 5.  Denied withdrawals.  Denied SI.  Denied physical problems.  Stated he has had back in the past 24 hours, pain medication is helpful.  Goal is to get medications regulated and talk to [facility].  Plans to call [facility].  No discharge plans. A:  Medications administered per MD orders.  Emotional support and encouragement given patient. R:  Denied SI and HI, contracts for safety.  Denied A/V hallucinations.  Safety maintained with 15 minute checks. \

## 2015-01-21 NOTE — [facility] Group Notes (Signed)
[facility] LCSW Group Therapy      Feelings About Diagnosis 1:15 - 2:30 PM         01/21/2015 2:47 PM    Type of Therapy:  Group Therapy  Participation Level:  Active  Participation Quality:  Appropriate  Affect:  Appropriate  Cognitive:  Alert and Appropriate  Insight:  Developing/Improving and Engaged  Engagement in Therapy:  Developing/Improving and Engaged  Modes of Intervention:  Discussion, Education, Exploration, Problem-Solving, Rapport Building, Support  Summary of Progress/Problems:  Patient actively participated in group. Patient discussed past and present diagnosis and the effects it has had on  life.  Patient talked about family and society being judgmental and the stigma associated with having a mental health diagnosis.  He talked about the anger he feels from not being able to control drinking like the average person.  He also talked about the difficult he has with calming himself when he has anxiety.  CSW facilitated a breathing exercise with group that can be used when they feel themselves becoming anxious.  Wynn BankerHodnett, Sueanne Maniaci Hairston 01/21/2015  2:47 PM

## 2015-01-21 NOTE — Progress Notes (Signed)
Pt attended group on [address] instead of going to [facility] [address] [facility] this evening.  Pt states that he had a pretty good day but felt as if he hadn't really accomplished anything today.  This Clinical research associatewriter asked if there was anything that he felt was a positive today and pt stated that he did request a list for [facility] from his case manager and felt good about taking steps towards his recovery.   Tomi BambergerMariya Timarie Labell, MHT

## 2015-01-21 NOTE — Progress Notes (Signed)
Pt attended spiritual care group on grief and loss facilitated by chaplain Burnis KingfisherMatthew Stalnaker and counseling intern Steven Adrain Butrick. Group opened with brief discussion and psycho-social ed around grief and loss in relationships and in relation to self - identifying life patterns, circumstances, changes that cause losses. Established group norm of speaking from own life experience. Group goal of establishing open and affirming space for members to share loss and experience with grief, normalize grief experience and provide psycho social education and grief support.  Group drew on narrative and Alderian therapeutic modalities.   [name] was present for beginning and end of group, and stepped out several times for treatment team. After group, [name] stated he was hopeful to discharge to an [facility]. [name] is concerned about finding work, and has been working with sponsor. 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   [name] Counseling Intern
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
## 2015-01-21 NOTE — Tx Team (Signed)
Interdisciplinary Treatment Plan Update   Date Reviewed:  01/21/2015  Time Reviewed:  9:15 AM  Progress in Treatment:   Attending groups: Yes, patient is attending groups. Participating in groups: Yes, engages in group discussion. Taking medication as prescribed: Yes  Tolerating medication: Yes Family/Significant other contact made:  No, patient declined collateral contact Patient understands diagnosis: Yes, patient understands diagnosis and need for treatment. Discussing patient identified problems/goals with staff: Yes, patient is able to express goals for treatment and discharge. Medical problems stabilized or resolved: Yes Denies suicidal/homicidal ideation: Yes Patient has not harmed self or others: Yes  For review of initial/current patient goals, please see plan of care.  Estimated Length of Stay:  2-4 days  Reasons for Continued Hospitalization:  Anxiety Depression Medication stabilization   New Problems/Goals identified:    Discharge Plan or Barriers:  Patient is considering an [facility].  [facility]  Additional Comments:  Continue medication stabilization  Patient and CSW reviewed patient's identified goals and treatment plan.  Patient verbalized understanding and agreed to treatment plan.   Attendees:  Patient:  01/21/2015 9:15 AM   Signature:  S. Eappen  01/21/2015 9:15 AM  Signature: Geoffery LyonsIrving Lugo, MD 01/21/2015 9:15 AM  Signature:  Elliot Cousinngela Thorne, RN 01/21/2015 9:15 AM  Signature:Beverly Terrilee CroakKnight, RN 01/21/2015 9:15 AM  Signature:  Sallyanne HaversF. Cobos, MD 01/21/2015 9:15 AM  Signature:  Juline PatchQuylle Arria Naim, LCSW 01/21/2015 9:15 AM  Signature:  Belenda CruiseKristin Drinkard, LCSW-A 01/21/2015 9:15 AM  Signature:  Leisa LenzValerie Enoch, Care Coordinator [facility] 01/21/2015 9:15 AM  Signature:   01/21/2015 9:15 AM  Signature:  01/21/2015  9:15 AM  Signature:   Onnie BoerJennifer Clark, RN URCM 01/21/2015  9:15 AM  Signature:   01/21/2015  9:15 AM    Scribe for Treatment Team:   Juline PatchQuylle Zorawar Strollo,   01/21/2015 9:15 AM

## 2015-01-21 NOTE — Progress Notes (Signed)
Recreation Therapy Notes  Animal-Assisted Activity (AAA) Program Checklist/Progress Notes Patient Eligibility Criteria Checklist & Daily Group note for Rec Tx Intervention  Date: 04.26.2016 Time: 2:45pm Location: [address] Dayroom    AAA/T Program Assumption of Risk Form signed by Patient/ or Parent Legal Guardian yes  Patient is free of allergies or sever asthma yes  Patient reports no fear of animals yes  Patient reports no history of cruelty to animals yes  Patient understands his/her participation is voluntary yes  Patient washes hands before animal contact yes  Patient washes hands after animal contact yes  Behavioral Response: Engaged, Appropriate   Education: Hand Washing, Appropriate Animal Interaction   Education Outcome: Acknowledges education.   Clinical Observations/Feedback: Patient actively engaged in session, interacting appropriately with therapy dog and peers.   Vondra Aldredge L Enza Shone, LRT/CTRS  Makyah Lavigne L 01/21/2015 4:55 PM 

## 2015-01-21 NOTE — Progress Notes (Signed)
D: Client reports feeling "blah" today, "might be from the medication" client notes depression from the blah feeling. "a lot of stuff going on"  Client expressed concerns about talking Ativan, although he is feeling anxious, would prefer something that's not addictive. A: Writer provided emotional support, reviewed medication, hydroxyzine 25 mg given for anxiety. (see MAR) Staff will monitor q4515min for safety. R:Client is safe on the unit, attended AA group.

## 2015-01-22 LAB — HEMOGLOBIN A1C
Hgb A1c MFr Bld: 5.5 % (ref 4.8–5.6)
MEAN PLASMA GLUCOSE: 111 mg/dL

## 2015-01-22 MED ORDER — QUETIAPINE FUMARATE 200 MG PO TABS: 200.0000 mg | ORAL_TABLET | Freq: Every day | ORAL | Status: DC

## 2015-01-22 MED ORDER — LORAZEPAM 1 MG PO TABS: 1.0000 mg | ORAL_TABLET | Freq: Two times a day (BID) | ORAL | Status: DC

## 2015-01-22 MED ORDER — TRAZODONE HCL 50 MG PO TABS: 50.0000 mg | ORAL_TABLET | Freq: Every evening | ORAL | Status: DC | PRN

## 2015-01-22 MED ORDER — QUETIAPINE FUMARATE 50 MG PO TABS
75.0000 mg | ORAL_TABLET | Freq: Three times a day (TID) | ORAL | Status: DC
  Filled 2015-01-22 (×3): qty 63

## 2015-01-22 MED ORDER — QUETIAPINE FUMARATE 25 MG PO TABS: 75.0000 mg | ORAL_TABLET | Freq: Three times a day (TID) | ORAL | Status: DC

## 2015-01-22 MED ORDER — PROPRANOLOL HCL 20 MG PO TABS: 20.0000 mg | ORAL_TABLET | Freq: Three times a day (TID) | ORAL | Status: DC

## 2015-01-22 MED ORDER — HYDROXYZINE HCL 25 MG PO TABS: 25.0000 mg | ORAL_TABLET | Freq: Four times a day (QID) | ORAL | Status: DC | PRN

## 2015-01-22 NOTE — Plan of Care (Signed)
Problem: Alteration in mood & ability to function due to Goal: STG-Patient will report withdrawal symptoms Outcome: Progressing Client will report anxiety AEB on a continuum scale of 1-10 with one as low and ten as high. Client currently rates anxiety as "5" of 10.
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
## 2015-01-22 NOTE — [facility] Suicide Risk Assessment (Signed)
[facility] Discharge Suicide Risk Assessment   Demographic Factors:  Male and Caucasian  Total Time spent with patient: 30 minutes  Musculoskeletal: Strength & Muscle Tone: within normal limits Gait & Station: normal Patient leans: N/A  Psychiatric Specialty Exam: Physical Exam  Review of Systems  Psychiatric/Behavioral: Negative for depression, suicidal ideas, hallucinations and substance abuse. The patient is not nervous/anxious and does not have insomnia.     Blood pressure 121/94, pulse 97, temperature 98 F (36.7 C), temperature source Oral, resp. rate 19, height 6' 1.75" (1.873 m), weight 96.616 kg (213 lb).Body mass index is 27.54 kg/(m^2).  General Appearance: Casual  Eye Contact::  Good  Speech:  Clear and Coherent409  Volume:  Normal  Mood:  anxious at baseline , but reports being able to cope with it , nothing that "I cannot cope".  Affect:  Congruent  Thought Process:  Coherent  Orientation:  Full (Time, Place, and Person)  Thought Content:  WDL  Suicidal Thoughts:  No  Homicidal Thoughts:  No  Memory:  Immediate;   Fair Recent;   Fair Remote;   Fair  Judgement:  Fair  Insight:  Fair  Psychomotor Activity:  Normal  Concentration:  Fair  Recall:  [employer] of Knowledge:Fair  Language: Fair  Akathisia:  No  Handed:  Right  AIMS (if indicated):     Assets:  Communication Skills Desire for Improvement  Sleep:  Number of Hours: 6  Cognition: WNL  ADL's:  Intact   Have you used any form of tobacco in the last 30 days? (Cigarettes, Smokeless Tobacco, Cigars, and/or Pipes): Yes  Has this patient used any form of tobacco in the last 30 days? (Cigarettes, Smokeless Tobacco, Cigars, and/or Pipes) Yes, Prescription given nicotine patches  Mental Status Per Nursing Assessment::   On Admission:     Current Mental Status by Physician: pt denies SI/HI/AH/VH  Loss Factors: Financial problems/change in socioeconomic status  Historical Factors: Impulsivity  Risk  Reduction Factors:   Positive social support  Continued Clinical Symptoms:  Alcohol/Substance Abuse/Dependencies Previous Psychiatric Diagnoses and Treatments  Cognitive Features That Contribute To Risk:  None    Suicide Risk:  Minimal: No identifiable suicidal ideation.  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 Sedative, hypnotic or anxiolytic dependence, in remission [F13.21] 01/21/2015  . Opioid use disorder, severe, in sustained remission [F11.90] 01/21/2015  . Alcohol use disorder, severe, dependence [F10.20] 01/20/2015  . Alcohol withdrawal without perceptual disturbances [F10.239] 01/20/2015  . Panic attacks [F41.0] 08/29/2013    Follow-up Information    Follow up with [facility].   Why:  Please go to [facility]'s walk in clinic any weekday between 8AM - 3PM for medication management.  Please notify them upon arrival that you are an established patient so that you will not have to wait to be seen.   Contact information:   [address] [place], [place]  [address]  [phone]      Plan Of Care/Follow-up recommendations:  Activity:  No restrictions Diet:  regular Tests:  as needed Other:  follow up with after care as well as [facility] .  Is  patient on multiple antipsychotic therapies at discharge:  No   Has Patient had three or more failed trials of antipsychotic monotherapy by history:  No  Recommended Plan for Multiple Antipsychotic Therapies: NA    Rosevelt Luu md 01/22/2015, 10:46 AM

## 2015-01-22 NOTE — Progress Notes (Signed)
Recreation Therapy Notes  Date: 04.27.2016 Time: 9:30am Location: [address] Group Room   Group Topic: Stress Management  Goal Area(s) Addresses:  Patient will actively participate in stress management techniques presented during session.   Behavioral Response: Engaged, Attentive  Intervention: Stress management techniques  Activity :  Deep Breathing and Guided Imagery. LRT provided instruction and demonstration on practice of Guided Imagery. Technique was coupled with deep breathing.   Education:  Stress Management, Discharge Planning.   Education Outcome: Acknowledges education  Clinical Observations/Feedback: Patient actively engaged in listening to guided imagery script about walking through a forrest. Patient expressed no complaints during session and confidence that he could practice independently post d/c.    Marykay Lexenise L Venicia Vandall, LRT/CTRS  Jearl KlinefelterBlanchfield, Sadiyah Kangas L 01/22/2015 3:23 PM

## 2015-01-22 NOTE — Progress Notes (Signed)
Patient ID: [name], male   DOB: Feb 16, 1976, 39 y.o.   MRN: [id number]  Pt. Denies SI/HI and A/V hallucinations. Belongings returned to patient at time of discharge. Patient denies any pain or discomfort. Discharge instructions and medications were reviewed with patient. Patient verbalized understanding of both medications and discharge instructions. Patient discharged to the lobby where his friend picked him up. Patient reported he was leaving and is going to an AA meeting. Q15 minute safety checks maintained until discharge.

## 2015-01-22 NOTE — [facility] Group Notes (Signed)
   [facility]  01/22/2015  8:45 AM   Participation Quality: Alert, Appropriate and Oriented  Mood/Affect: Appropriate  Depression Rating: 1-2  Anxiety Rating: 5  Thoughts of Suicide: Pt denies SI/HI  Will you contract for safety? Yes  Current AVH: Pt denies  Plan for Discharge/Comments: Pt attended discharge planning group and actively participated in group. CSW provided pt with today's workbook. Patient reports feeling ready for discharge. He plans to return home with his girlfriend and has an interview scheduled with an [facility] in [place] on 4[place]29.   Transportation Means: Pt reports access to transportation  Supports: No supports mentioned at this time  Samuella BruinKristin Jenisha Faison, MSW, [employer]CSWA Clinical Social Worker [facility] [phone]

## 2015-01-22 NOTE — Progress Notes (Signed)
  [facility] Discharge Plan :  Will you be returning to the same living situation after discharge:  Yes,  patient plans to stay with his girlfriend At discharge, do you have transportation home?: Yes,  patient reports access to transportation Do you have the ability to pay for your medications: Yes,  patient will be provided with medication samples and prescriptions at discharge  Release of information consent forms completed and in the chart;  Patient's signature needed at discharge.  Patient to Follow up at: Follow-up Information    Follow up with [facility].   Why:  Please go to [facility]'s walk in clinic any weekday between 8AM - 3PM for medication management.  Please notify them upon arrival that you are an established patient so that you will not have to wait to be seen.   Contact information:   [address] [place], [place]  [id number]  [phone]      Patient denies SI/HI: Yes,  denies    Safety Planning and Suicide Prevention discussed: Yes,  with patient   Have you used any form of tobacco in the last 30 days? (Cigarettes, Smokeless Tobacco, Cigars, and/or Pipes): Yes  Has patient been referred to the Quitline?: Patient refused referral   [place], [place] CarboKristin L 01/22/2015, 10:20 AM

## 2015-03-03 ENCOUNTER — Emergency Department (HOSPITAL_COMMUNITY)
Admission: EM | Admit: 2015-03-03 | Discharge: 2015-03-03 | Disposition: A | Discharge status: Home / Self Care | Payer: Self-pay | Attending: Emergency Medicine | Admitting: Emergency Medicine

## 2015-03-03 ENCOUNTER — Encounter (HOSPITAL_COMMUNITY): Payer: Self-pay

## 2015-03-03 DIAGNOSIS — F419 Anxiety disorder, unspecified: Secondary | ICD-10-CM

## 2015-03-03 DIAGNOSIS — Z79899 Other long term (current) drug therapy: Secondary | ICD-10-CM | POA: Insufficient documentation

## 2015-03-03 DIAGNOSIS — Z760 Encounter for issue of repeat prescription: Secondary | ICD-10-CM | POA: Insufficient documentation

## 2015-03-03 DIAGNOSIS — Z720 Tobacco use: Secondary | ICD-10-CM | POA: Insufficient documentation

## 2015-03-03 DIAGNOSIS — F410 Panic disorder [episodic paroxysmal anxiety] without agoraphobia: Secondary | ICD-10-CM | POA: Insufficient documentation

## 2015-03-03 DIAGNOSIS — F319 Bipolar disorder, unspecified: Secondary | ICD-10-CM | POA: Insufficient documentation

## 2015-03-03 MED ORDER — LORAZEPAM 0.5 MG PO TABS
1.0000 mg | ORAL_TABLET | Freq: Once | ORAL | Status: AC
  Administered 2015-03-03: 1 mg via ORAL
  Filled 2015-03-03: qty 2

## 2015-03-03 MED ORDER — PROPRANOLOL HCL 20 MG PO TABS: 20.0000 mg | ORAL_TABLET | Freq: Three times a day (TID) | ORAL | Status: DC

## 2015-03-03 NOTE — Discharge Instructions (Signed)

## 2015-03-03 NOTE — ED Notes (Signed)
Pt was at work and started having a panic attack and had to leave and come here. Has been out of propanolol for a couple days and his appt is on the 19th to be seen and get a refill. Denies any SI or HI.
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
## 2016-09-30 ENCOUNTER — Encounter (HOSPITAL_COMMUNITY): Payer: Self-pay

## 2016-09-30 ENCOUNTER — Emergency Department (HOSPITAL_COMMUNITY)
Admission: EM | Admit: 2016-09-30 | Discharge: 2016-09-30 | Disposition: A | Discharge status: Home / Self Care | Payer: Self-pay | Attending: Emergency Medicine | Admitting: Emergency Medicine

## 2016-09-30 DIAGNOSIS — Y939 Activity, unspecified: Secondary | ICD-10-CM | POA: Insufficient documentation

## 2016-09-30 DIAGNOSIS — X58XXXA Exposure to other specified factors, initial encounter: Secondary | ICD-10-CM | POA: Insufficient documentation

## 2016-09-30 DIAGNOSIS — F17210 Nicotine dependence, cigarettes, uncomplicated: Secondary | ICD-10-CM | POA: Insufficient documentation

## 2016-09-30 DIAGNOSIS — S0502XA Injury of conjunctiva and corneal abrasion without foreign body, left eye, initial encounter: Secondary | ICD-10-CM | POA: Insufficient documentation

## 2016-09-30 DIAGNOSIS — Y929 Unspecified place or not applicable: Secondary | ICD-10-CM | POA: Insufficient documentation

## 2016-09-30 DIAGNOSIS — Y999 Unspecified external cause status: Secondary | ICD-10-CM | POA: Insufficient documentation

## 2016-09-30 DIAGNOSIS — Z79899 Other long term (current) drug therapy: Secondary | ICD-10-CM | POA: Insufficient documentation

## 2016-09-30 MED ORDER — TOBRAMYCIN 0.3 % OP SOLN: 2.0000 [drp] | Freq: Four times a day (QID) | OPHTHALMIC | 0 refills | Status: AC

## 2016-09-30 MED ORDER — KETOROLAC TROMETHAMINE 0.4 % OP SOLN: 1.0000 [drp] | Freq: Two times a day (BID) | OPHTHALMIC | 0 refills | Status: DC | PRN

## 2016-09-30 MED ORDER — TETRACAINE HCL 0.5 % OP SOLN
2.0000 [drp] | Freq: Once | OPHTHALMIC | Status: AC
  Administered 2016-09-30: 2 [drp] via OPHTHALMIC
  Filled 2016-09-30: qty 4

## 2016-09-30 MED ORDER — FLUORESCEIN SODIUM 0.6 MG OP STRP
1.0000 | ORAL_STRIP | Freq: Once | OPHTHALMIC | Status: AC
  Administered 2016-09-30: 1 via OPHTHALMIC
  Filled 2016-09-30: qty 1

## 2016-09-30 MED ORDER — TETANUS-DIPHTH-ACELL PERTUSSIS 5-2.5-18.5 LF-MCG/0.5 IM SUSP
0.5000 mL | Freq: Once | INTRAMUSCULAR | Status: AC
  Administered 2016-09-30: 0.5 mL via INTRAMUSCULAR
  Filled 2016-09-30: qty 0.5
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
## 2016-09-30 NOTE — Discharge Instructions (Signed)
Read the information below.  Use the prescribed medication as directed.  Please discuss all new medications with your pharmacist.  You may return to the Emergency Department at any time for worsening condition or any new symptoms that concern you.      If you develop worsening pain in your eye, change in your vision, swelling around your eye, difficulty moving your eye, or fevers greater than 100.4, see your eye doctor or return to the Emergency Department immediately for a recheck.    °

## 2016-09-30 NOTE — ED Triage Notes (Signed)
PT C/OLEFT EYE PAIN AND WATERING X2 DAYS. PT STS HE FELL ASLEEP WITH HIS DISPOSABLE CONTACTS, AND WHEN HE WOKE UP AT 0200, IT WAS HARD FOR HIM TO REMOVE THE CONTACT. SINCE REMOVING THE CONTACT, HE STS IT FEELS LIKE SOMETHING IS STILL IN IT. HE HAS A LOT OF PAIN WHEN BLINKING AND BLURRED VISION.
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
## 2017-01-25 HISTORY — PX: ORIF ACETABULAR FRACTURE: SHX5029

## 2017-02-07 ENCOUNTER — Inpatient Hospital Stay (HOSPITAL_COMMUNITY)
Admission: EM | Admit: 2017-02-07 | Discharge: 2017-02-14 | DRG: 516 | Disposition: A | Discharge status: Home / Self Care | Payer: 59 | Attending: Orthopedic Surgery | Admitting: Orthopedic Surgery

## 2017-02-07 ENCOUNTER — Encounter (HOSPITAL_COMMUNITY): Payer: Self-pay | Admitting: Emergency Medicine

## 2017-02-07 ENCOUNTER — Inpatient Hospital Stay (HOSPITAL_COMMUNITY): Payer: Self-pay

## 2017-02-07 ENCOUNTER — Emergency Department (HOSPITAL_COMMUNITY): Payer: Self-pay

## 2017-02-07 DIAGNOSIS — W11XXXA Fall on and from ladder, initial encounter: Secondary | ICD-10-CM | POA: Diagnosis present

## 2017-02-07 DIAGNOSIS — S32432A Displaced fracture of anterior column [iliopubic] of left acetabulum, initial encounter for closed fracture: Principal | ICD-10-CM | POA: Diagnosis present

## 2017-02-07 DIAGNOSIS — F410 Panic disorder [episodic paroxysmal anxiety] without agoraphobia: Secondary | ICD-10-CM | POA: Diagnosis present

## 2017-02-07 DIAGNOSIS — D62 Acute posthemorrhagic anemia: Secondary | ICD-10-CM | POA: Diagnosis not present

## 2017-02-07 DIAGNOSIS — F419 Anxiety disorder, unspecified: Secondary | ICD-10-CM | POA: Diagnosis present

## 2017-02-07 DIAGNOSIS — S32402A Unspecified fracture of left acetabulum, initial encounter for closed fracture: Secondary | ICD-10-CM | POA: Diagnosis present

## 2017-02-07 DIAGNOSIS — W19XXXA Unspecified fall, initial encounter: Secondary | ICD-10-CM | POA: Diagnosis present

## 2017-02-07 DIAGNOSIS — Y990 Civilian activity done for income or pay: Secondary | ICD-10-CM

## 2017-02-07 DIAGNOSIS — S329XXA Fracture of unspecified parts of lumbosacral spine and pelvis, initial encounter for closed fracture: Secondary | ICD-10-CM

## 2017-02-07 DIAGNOSIS — S50312A Abrasion of left elbow, initial encounter: Secondary | ICD-10-CM | POA: Diagnosis present

## 2017-02-07 DIAGNOSIS — S32409A Unspecified fracture of unspecified acetabulum, initial encounter for closed fracture: Secondary | ICD-10-CM

## 2017-02-07 DIAGNOSIS — F17210 Nicotine dependence, cigarettes, uncomplicated: Secondary | ICD-10-CM | POA: Diagnosis present

## 2017-02-07 DIAGNOSIS — F319 Bipolar disorder, unspecified: Secondary | ICD-10-CM | POA: Diagnosis present

## 2017-02-07 DIAGNOSIS — F17200 Nicotine dependence, unspecified, uncomplicated: Secondary | ICD-10-CM | POA: Diagnosis present

## 2017-02-07 DIAGNOSIS — Z419 Encounter for procedure for purposes other than remedying health state, unspecified: Secondary | ICD-10-CM

## 2017-02-07 LAB — CBC WITH DIFFERENTIAL/PLATELET
BASOS ABS: 0 10*3/uL (ref 0.0–0.1)
BASOS PCT: 0 %
Eosinophils Absolute: 0.1 10*3/uL (ref 0.0–0.7)
Eosinophils Relative: 1 %
HEMATOCRIT: 42.1 % (ref 39.0–52.0)
Hemoglobin: 14.5 g/dL (ref 13.0–17.0)
Lymphocytes Relative: 18 %
Lymphs Abs: 2.8 10*3/uL (ref 0.7–4.0)
MCH: 28.6 pg (ref 26.0–34.0)
MCHC: 34.4 g/dL (ref 30.0–36.0)
MCV: 83 fL (ref 78.0–100.0)
MONO ABS: 1.8 10*3/uL — AB (ref 0.1–1.0)
Monocytes Relative: 12 %
NEUTROS ABS: 10.6 10*3/uL — AB (ref 1.7–7.7)
NEUTROS PCT: 69 %
Platelets: 206 10*3/uL (ref 150–400)
RBC: 5.07 MIL/uL (ref 4.22–5.81)
RDW: 13.6 % (ref 11.5–15.5)
WBC: 15.2 10*3/uL — AB (ref 4.0–10.5)

## 2017-02-07 LAB — URINALYSIS, ROUTINE W REFLEX MICROSCOPIC
Bilirubin Urine: NEGATIVE
Glucose, UA: NEGATIVE mg/dL
Hgb urine dipstick: NEGATIVE
Ketones, ur: NEGATIVE mg/dL
LEUKOCYTES UA: NEGATIVE
NITRITE: NEGATIVE
PROTEIN: NEGATIVE mg/dL
Specific Gravity, Urine: 1.018 (ref 1.005–1.030)
pH: 6 (ref 5.0–8.0)

## 2017-02-07 LAB — BASIC METABOLIC PANEL
ANION GAP: 8 (ref 5–15)
BUN: 15 mg/dL (ref 6–20)
CO2: 24 mmol/L (ref 22–32)
CREATININE: 1.04 mg/dL (ref 0.61–1.24)
Calcium: 8.9 mg/dL (ref 8.9–10.3)
Chloride: 104 mmol/L (ref 101–111)
GFR calc non Af Amer: >60 mL/min (ref >60)
Glucose, Bld: 115 mg/dL — ABNORMAL HIGH (ref 65–99)
POTASSIUM: 4.1 mmol/L (ref 3.5–5.1)
Sodium: 136 mmol/L (ref 135–145)

## 2017-02-07 MED ORDER — OXYCODONE HCL 5 MG PO TABS
5.0000 mg | ORAL_TABLET | ORAL | Status: DC | PRN
  Administered 2017-02-07 – 2017-02-14 (×23): 10 mg via ORAL
  Filled 2017-02-07 (×24): qty 2

## 2017-02-07 MED ORDER — HYDROCODONE-ACETAMINOPHEN 5-325 MG PO TABS
1.0000 | ORAL_TABLET | Freq: Once | ORAL | Status: AC
  Administered 2017-02-07: 1 via ORAL
  Filled 2017-02-07: qty 1

## 2017-02-07 MED ORDER — DOCUSATE SODIUM 100 MG PO CAPS
100.0000 mg | ORAL_CAPSULE | Freq: Two times a day (BID) | ORAL | Status: DC
  Administered 2017-02-07 – 2017-02-12 (×9): 100 mg via ORAL
  Filled 2017-02-07 (×12): qty 1

## 2017-02-07 MED ORDER — ACETAMINOPHEN 650 MG RE SUPP: 650.0000 mg | Freq: Four times a day (QID) | RECTAL | Status: DC | PRN

## 2017-02-07 MED ORDER — SODIUM CHLORIDE 0.45 % IV SOLN
INTRAVENOUS | Status: DC
  Administered 2017-02-08: 02:00:00 via INTRAVENOUS

## 2017-02-07 MED ORDER — TETANUS-DIPHTH-ACELL PERTUSSIS 5-2.5-18.5 LF-MCG/0.5 IM SUSP
0.5000 mL | Freq: Once | INTRAMUSCULAR | Status: AC
  Administered 2017-02-07: 0.5 mL via INTRAMUSCULAR
  Filled 2017-02-07: qty 0.5

## 2017-02-07 MED ORDER — ENOXAPARIN SODIUM 40 MG/0.4ML ~~LOC~~ SOLN
40.0000 mg | SUBCUTANEOUS | Status: DC
  Administered 2017-02-08 – 2017-02-09 (×2): 40 mg via SUBCUTANEOUS
  Filled 2017-02-07 (×2): qty 0.4

## 2017-02-07 MED ORDER — HYDROMORPHONE HCL 1 MG/ML IJ SOLN
0.5000 mg | INTRAMUSCULAR | Status: DC | PRN
  Administered 2017-02-07 – 2017-02-12 (×20): 0.5 mg via INTRAVENOUS
  Filled 2017-02-07 (×20): qty 1

## 2017-02-07 MED ORDER — ACETAMINOPHEN 325 MG PO TABS
650.0000 mg | ORAL_TABLET | Freq: Four times a day (QID) | ORAL | Status: DC | PRN
  Administered 2017-02-07 – 2017-02-08 (×2): 650 mg via ORAL
  Filled 2017-02-07 (×2): qty 2

## 2017-02-07 NOTE — ED Notes (Signed)
Attempted report x1. 
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
## 2017-02-07 NOTE — ED Notes (Signed)
Delay in lab draw,  Pt not in room 

## 2017-02-07 NOTE — ED Notes (Signed)
Pt in radiology at this time. 

## 2017-02-07 NOTE — ED Notes (Signed)
Pt still in XR, updated family. Pt's friend left to get pt food.

## 2017-02-07 NOTE — ED Triage Notes (Signed)
Pt reports falling from 8 foot ladder and landing on left hip and arm. Denies hitting his head, c/o left hip pain. Pt a/ox4 resp e/u, nad.

## 2017-02-07 NOTE — ED Notes (Addendum)
Pt verbalized he is in excruciating pain post XR. Pt tearful, pt requesting time to rest before his other scans. Will hold transport to CT until pt pain is more controlled.

## 2017-02-07 NOTE — ED Notes (Signed)
Pt abrasions cleaned with hydrogen peroxide and dressed with sterile gauze.
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
## 2017-02-07 NOTE — ED Notes (Signed)
Pt stated he would like to get his CT done now before going upstairs. Called CT and informed them.

## 2017-02-07 NOTE — ED Notes (Signed)
Provider at the bedside, will give medication when provider is finished assessing pt.

## 2017-02-08 ENCOUNTER — Encounter (HOSPITAL_COMMUNITY): Payer: Self-pay | Admitting: Orthopedic Surgery

## 2017-02-08 DIAGNOSIS — F17200 Nicotine dependence, unspecified, uncomplicated: Secondary | ICD-10-CM | POA: Diagnosis present

## 2017-02-08 DIAGNOSIS — F419 Anxiety disorder, unspecified: Secondary | ICD-10-CM | POA: Diagnosis present

## 2017-02-08 LAB — HIV ANTIBODY (ROUTINE TESTING W REFLEX): HIV SCREEN 4TH GENERATION: NONREACTIVE

## 2017-02-08 MED ORDER — GUAIFENESIN-DM 100-10 MG/5ML PO SYRP: 10.0000 mL | ORAL_SOLUTION | ORAL | Status: DC | PRN

## 2017-02-08 MED ORDER — TRAZODONE HCL 100 MG PO TABS
100.0000 mg | ORAL_TABLET | Freq: Every day | ORAL | Status: DC
  Administered 2017-02-08 – 2017-02-09 (×3): 100 mg via ORAL
  Administered 2017-02-10 – 2017-02-13 (×4): 200 mg via ORAL
  Filled 2017-02-08 (×4): qty 2
  Filled 2017-02-08: qty 1
  Filled 2017-02-08 (×2): qty 2

## 2017-02-08 MED ORDER — DIPHENHYDRAMINE HCL 12.5 MG/5ML PO ELIX
12.5000 mg | ORAL_SOLUTION | ORAL | Status: DC | PRN
  Administered 2017-02-09 – 2017-02-10 (×2): 25 mg via ORAL
  Filled 2017-02-08 (×2): qty 10

## 2017-02-08 MED ORDER — MENTHOL 3 MG MT LOZG: 1.0000 | LOZENGE | OROMUCOSAL | Status: DC | PRN

## 2017-02-08 MED ORDER — ONDANSETRON HCL 4 MG PO TABS: 4.0000 mg | ORAL_TABLET | Freq: Four times a day (QID) | ORAL | Status: DC | PRN

## 2017-02-08 MED ORDER — OXYCODONE-ACETAMINOPHEN 5-325 MG PO TABS
1.0000 | ORAL_TABLET | Freq: Four times a day (QID) | ORAL | Status: DC | PRN
  Administered 2017-02-09 – 2017-02-14 (×14): 2 via ORAL
  Filled 2017-02-08 (×15): qty 2

## 2017-02-08 MED ORDER — LORATADINE 10 MG PO TABS: 10.0000 mg | ORAL_TABLET | Freq: Every day | ORAL | Status: DC | PRN

## 2017-02-08 MED ORDER — ACETAMINOPHEN 650 MG RE SUPP: 650.0000 mg | Freq: Four times a day (QID) | RECTAL | Status: DC | PRN

## 2017-02-08 MED ORDER — PROPRANOLOL HCL 10 MG PO TABS
20.0000 mg | ORAL_TABLET | Freq: Four times a day (QID) | ORAL | Status: DC
  Administered 2017-02-08 – 2017-02-10 (×10): 20 mg via ORAL
  Filled 2017-02-08 (×10): qty 2

## 2017-02-08 MED ORDER — ONDANSETRON HCL 4 MG/2ML IJ SOLN
4.0000 mg | Freq: Four times a day (QID) | INTRAMUSCULAR | Status: DC | PRN
  Administered 2017-02-09 – 2017-02-10 (×4): 4 mg via INTRAVENOUS
  Filled 2017-02-08 (×4): qty 2

## 2017-02-08 MED ORDER — POLYETHYLENE GLYCOL 3350 17 G PO PACK
17.0000 g | PACK | Freq: Two times a day (BID) | ORAL | Status: DC
  Administered 2017-02-08 – 2017-02-12 (×8): 17 g via ORAL
  Filled 2017-02-08 (×10): qty 1

## 2017-02-08 MED ORDER — PHENOL 1.4 % MT LIQD: 1.0000 | OROMUCOSAL | Status: DC | PRN

## 2017-02-08 MED ORDER — HYDROXYZINE HCL 25 MG PO TABS
50.0000 mg | ORAL_TABLET | Freq: Four times a day (QID) | ORAL | Status: DC
  Administered 2017-02-08 – 2017-02-10 (×10): 50 mg via ORAL
  Filled 2017-02-08 (×10): qty 2

## 2017-02-08 MED ORDER — METHOCARBAMOL 500 MG PO TABS
500.0000 mg | ORAL_TABLET | Freq: Four times a day (QID) | ORAL | Status: DC | PRN
  Administered 2017-02-09: 1000 mg via ORAL
  Administered 2017-02-09: 500 mg via ORAL
  Administered 2017-02-10 – 2017-02-12 (×7): 1000 mg via ORAL
  Filled 2017-02-08 (×2): qty 2
  Filled 2017-02-08: qty 1
  Filled 2017-02-08 (×6): qty 2

## 2017-02-08 MED ORDER — ALUM & MAG HYDROXIDE-SIMETH 200-200-20 MG/5ML PO SUSP
30.0000 mL | Freq: Four times a day (QID) | ORAL | Status: DC | PRN
  Administered 2017-02-09 – 2017-02-13 (×4): 30 mL via ORAL
  Filled 2017-02-08 (×4): qty 30

## 2017-02-08 MED ORDER — SALINE SPRAY 0.65 % NA SOLN
1.0000 | NASAL | Status: DC | PRN
  Administered 2017-02-09: 1 via NASAL
  Filled 2017-02-08: qty 44

## 2017-02-08 MED ORDER — ACETAMINOPHEN 325 MG PO TABS: 650.0000 mg | ORAL_TABLET | Freq: Four times a day (QID) | ORAL | Status: DC | PRN

## 2017-02-08 MED ORDER — POTASSIUM CHLORIDE IN NACL 20-0.9 MEQ/L-% IV SOLN
INTRAVENOUS | Status: DC
  Administered 2017-02-08 – 2017-02-09 (×3): via INTRAVENOUS
  Filled 2017-02-08 (×4): qty 1000
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
## 2017-02-08 NOTE — Clinical Social Work Note (Signed)
Clinical Social Work Assessment  Patient Details  Name: [name] MRN: [id number] Date of Birth: 04-16-1976  Date of referral:  02/08/17               Reason for consult:  Insurance Barriers                Permission sought to share information with:  Family Supports Permission granted to share information::  Yes, Verbal Permission Granted  Name::     [name]  Agency::     Relationship::  Fiance  Contact Information:  [phone]  Housing/Transportation Living arrangements for the past 2 months:  [place] of Information:  Patient Patient Interpreter Needed:  None Criminal Activity/Legal Involvement Pertinent to Current Situation/Hospitalization:  No - Comment as needed Significant Relationships:  Significant Other Lives with:  Significant Other Do you feel safe going back to the place where you live?  Yes Need for family participation in patient care:  No (Coment)  Care giving concerns:  No care giving concerns identified.    Social Worker assessment / plan:  CSW met with pt at bedside to address consult for "Acetabular fx , medicaid application." Pt fiance-[name] present at bedside. Pt stated he wanted information about medicaid as he does not have insurance and no way to pay medical bills. Pt stated a Development worker, community spoke with him today, but he does not remember the conversation as he was on pain medication. CSW spoke with Horatio Pel [phone]) in financial counseling and confirmed she did speak with him today and they are following him to see if he would be eligible for Medicaid or financial assistance with medical bill.   CSW will convey this information to pt and fiance, as well as provide financial counselors contact information for further questions. CSW signing off as no further needs identified. Please reconsult if new SW needs arise.   Employment status:  [employer] information:  Self Pay (Medicaid Pending) PT  Recommendations:  Not assessed at this time Information / Referral to community resources:  Other (Comment Required) (Discussed Financial Counseling/Medicaid)  Patient/Family's Response to care:  Pt and fiance appreciative of CSW guidance and support.   Patient/Family's Understanding of and Emotional Response to Diagnosis, Current Treatment, and Prognosis:  Pt and fiance understand diagnosis, prognosis, and current treatment. Pt concerned about ability to cover medical bills.   Emotional Assessment Appearance:  Appears stated age Attitude/Demeanor/Rapport:  Other (Appropriate) Affect (typically observed):  Accepting, Adaptable, Pleasant Orientation:  Oriented to Self, Oriented to Place, Oriented to  Time, Oriented to Situation Alcohol / Substance use:  Other Psych involvement (Current and /or in the community):  No (Comment)  Discharge Needs  Concerns to be addressed:  Financial / Insurance Concerns Readmission within the last 30 days:  No Current discharge risk:  Physical Impairment Barriers to Discharge:  Continued Medical Work up, Inadequate or no insurance   Truitt Merle, LCSW 02/08/2017, 4:45 PM

## 2017-02-09 LAB — CBC
HEMATOCRIT: 41.3 % (ref 39.0–52.0)
Hemoglobin: 13.9 g/dL (ref 13.0–17.0)
MCH: 28.3 pg (ref 26.0–34.0)
MCHC: 33.7 g/dL (ref 30.0–36.0)
MCV: 83.9 fL (ref 78.0–100.0)
Platelets: 194 10*3/uL (ref 150–400)
RBC: 4.92 MIL/uL (ref 4.22–5.81)
RDW: 13.4 % (ref 11.5–15.5)
WBC: 11 10*3/uL — AB (ref 4.0–10.5)

## 2017-02-09 LAB — BASIC METABOLIC PANEL
ANION GAP: 4 — AB (ref 5–15)
BUN: 10 mg/dL (ref 6–20)
CALCIUM: 8.5 mg/dL — AB (ref 8.9–10.3)
CO2: 31 mmol/L (ref 22–32)
Chloride: 101 mmol/L (ref 101–111)
Creatinine, Ser: 0.91 mg/dL (ref 0.61–1.24)
GFR calc Af Amer: >60 mL/min (ref >60)
GFR calc non Af Amer: >60 mL/min (ref >60)
GLUCOSE: 110 mg/dL — AB (ref 65–99)
POTASSIUM: 4.3 mmol/L (ref 3.5–5.1)
SODIUM: 136 mmol/L (ref 135–145)

## 2017-02-09 NOTE — Evaluation (Signed)
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 Pt landed on his L side and was found to have a L anterior column posterior hemitransverse acetabulum fx. Pt was evaluated by ortho trauma, planning for OR on 5/18 for pelvic fixation. PMH including but not limited to bipolar disorder and anxiety.  Clinical Impression  Pt presented supine in bed with HOB elevated, awake and willing to participate in therapy session. Prior to admission, pt reported that he was independent with all functional mobility and ADLs. Pt works as a Architectural technologistcontract painter. Pt lives with his fiance and will have 24/7 assistance upon d/c. Pt able to transfer from bed to chair with RW and min A. He was able to maintain TWB L LE independently throughout. Pt would continue to benefit from skilled physical therapy services at this time while admitted to address the below listed limitations in order to improve overall safety and independence with functional mobility.     Follow Up Recommendations Supervision/Assistance - 24 hour    Equipment Recommendations  Rolling walker with 5" wheels;3in1 (PT);Wheelchair (measurements PT);Wheelchair cushion (measurements PT) (w/c with elevating leg rests)    Recommendations for Other Services       Precautions / Restrictions Precautions Precautions: Fall Restrictions Weight Bearing Restrictions: Yes LLE Weight Bearing: Touchdown weight bearing      Mobility  Bed Mobility Overal bed mobility: Needs Assistance Bed Mobility: Supine to Sit     Supine to sit: Min assist;HOB elevated     General bed mobility comments: increased time, use of bed rails, min A with L LE movement  Transfers Overall transfer level: Needs assistance Equipment used: Rolling walker (2 wheeled) Transfers: Sit to/from W. R. BerkleyStand;Stand  Pivot Transfers Sit to Stand: Min guard;From elevated surface Stand pivot transfers: Min assist       General transfer comment: increased time, good technique, vc'ing and demonstration for sequencing, min guard for safety with rise from elevated bed position and min A with movement of RW during pivotal movements to chair.  Ambulation/Gait                Stairs            Wheelchair Mobility    Modified Rankin (Stroke Patients Only)       Balance Overall balance assessment: Needs assistance Sitting-balance support: Feet supported Sitting balance-Leahy Scale: Good     Standing balance support: During functional activity;Bilateral upper extremity supported Standing balance-Leahy Scale: Poor Standing balance comment: pt reliant on bilateral UEs on RW                             Pertinent Vitals/Pain Pain Assessment: 0-10 Pain Score: 6  Pain Location: L hip Pain Descriptors / Indicators: Sore;Grimacing Pain Intervention(s): Monitored during session;Repositioned;Premedicated before session    Home Living Family/patient expects to be discharged to:: Private residence Living Arrangements: Spouse/significant other Available Help at Discharge: Family;Available 24 hours/day Type of Home: House Home Access: Stairs to enter Entrance Stairs-Rails: None Entrance Stairs-Number of Steps: 1 Home Layout: One level Home Equipment: None      Prior Function Level of Independence: Independent         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 Pt with general weakness secondary to pain and required physical assistance with active movement against gravity during bed mobility.  Pt able to maintain TWB throughout transfer independently.    Cervical / Trunk Assessment Cervical / Trunk Assessment: Normal  Communication   Communication: No difficulties  Cognition Arousal/Alertness: Awake/alert Behavior During Therapy: WFL for tasks assessed/performed Overall Cognitive Status: Within Functional Limits for tasks assessed                                        General Comments      Exercises General Exercises - Lower Extremity Ankle Circles/Pumps: AROM;Left;10 reps;Seated   Assessment/Plan    PT Assessment Patient needs continued PT services  PT Problem List Decreased strength;Decreased activity tolerance;Decreased balance;Decreased range of motion;Decreased mobility;Decreased coordination;Decreased knowledge of use of DME;Decreased safety awareness;Decreased knowledge of precautions;Pain       PT Treatment Interventions DME instruction;Gait training;Stair training;Functional mobility training;Therapeutic exercise;Balance training;Therapeutic activities;Neuromuscular re-education;Patient/family education    PT Goals (Current goals can be found in the Care Plan section)  Acute Rehab PT Goals Patient Stated Goal: decrease pain, return to PLOF PT Goal Formulation: With patient Time For Goal Achievement: 02/23/17 Potential to Achieve Goals: Good    Frequency Min 5X/week   Barriers to discharge        Co-evaluation               AM-PAC PT "6 Clicks" Daily Activity  Outcome Measure Difficulty turning over in bed (including adjusting bedclothes, sheets and blankets)?: A Lot Difficulty moving from lying on back to sitting on the side of the bed? : Total Difficulty sitting down on and standing up from a chair with arms (e.g., wheelchair, bedside commode, etc,.)?: A Lot Help needed moving to and from a bed to chair (including a wheelchair)?: A Little Help needed walking in hospital room?: A Lot Help needed climbing 3-5 steps with a  railing? : Total 6 Click Score: 11    End of Session Equipment Utilized During Treatment: Gait belt Activity Tolerance: Patient limited by pain Patient left: in chair;with call bell/phone within reach;with chair alarm set Nurse Communication: Mobility status PT Visit Diagnosis: Other abnormalities of gait and mobility (R26.89);Pain Pain - Right/Left: Left Pain - part of body: Hip    Time: 1610-9604 PT Time Calculation (min) (ACUTE ONLY): 17 min   Charges:   PT Evaluation $PT Eval Moderate Complexity: 1 Procedure     PT G Codes:        Deborah Chalk, PT, DPT [phone]   [name] 02/09/2017, 10:12 AM

## 2017-02-09 NOTE — Progress Notes (Signed)
Orthopedic Trauma Service Progress Note    Subjective:  Doing ok Pain exacerbated with movement in bed No other issues noted  Tolerating diet   Review of Systems  Constitutional: Negative for chills and fever.  Respiratory: Negative for shortness of breath and wheezing.   Cardiovascular: Negative for chest pain and palpitations.  Gastrointestinal: Negative for abdominal pain and vomiting.  Neurological: Negative for sensory change.    Objective:   VITALS:   Vitals:   02/08/17 0510 02/08/17 1428 02/08/17 2043 02/09/17 0556  BP: 121/74 120/73 125/74 116/77  Pulse: 77 76 72 64  Resp: 16 18 18 20   Temp: 98.1 F (36.7 C) 98.2 F (36.8 C) 98.5 F (36.9 C) 97.5 F (36.4 C)  TempSrc: Oral Oral Oral Oral  SpO2: 100% 100% 98% 94%  Weight:    106.7 kg (235 lb 3.2 oz)  Height:        Intake/Output      05/15 0701 - 05/16 0700 05/16 0701 - 05/17 0700   P.O. 1182    I.V. (mL/kg) 750 (7)    Total Intake(mL/kg) 1932 (18.1)    Urine (mL/kg/hr) 2525 (1)    Total Output 2525     Net -593            LABS  Results for orders placed or performed during the hospital encounter of 02/07/17 (from the past 24 hour(s))  Basic metabolic panel     Status: Abnormal   Collection Time: 02/09/17  5:04 AM  Result Value Ref Range   Sodium 136 135 - 145 mmol/L   Potassium 4.3 3.5 - 5.1 mmol/L   Chloride 101 101 - 111 mmol/L   CO2 31 22 - 32 mmol/L   Glucose, Bld 110 (H) 65 - 99 mg/dL   BUN 10 6 - 20 mg/dL   Creatinine, Ser 0.980.91 0.61 - 1.24 mg/dL   Calcium 8.5 (L) 8.9 - 10.3 mg/dL   GFR calc non Af Amer >60 >60 mL/min   GFR calc Af Amer >60 >60 mL/min   Anion gap 4 (L) 5 - 15  CBC     Status: Abnormal   Collection Time: 02/09/17  5:04 AM  Result Value Ref Range   WBC 11.0 (H) 4.0 - 10.5 K/uL   RBC 4.92 4.22 - 5.81 MIL/uL   Hemoglobin 13.9 13.0 - 17.0 g/dL   HCT 11.941.3 14.739.0 - 82.952.0 %   MCV 83.9 78.0 - 100.0 fL   MCH 28.3 26.0 - 34.0 pg   MCHC 33.7 30.0 - 36.0  g/dL   RDW 56.213.4 13.011.5 - 86.515.5 %   Platelets 194 150 - 400 K/uL     PHYSICAL EXAM:   Gen: resting comfortably in bed, NAD Lungs: clear anterior fields Cardiac: regular  Abd: + BS, NTND Pelvis/Left lower extremity   Distal motor and sensory functions intact  Ext is warm  + DP pulse   Swelling well controlled    Assessment/Plan:     Principal Problem:   Closed left acetabular fracture (HCC) Active Problems:   Panic attacks   Nicotine dependence   Anxiety   Anti-infectives    None    .  POD/HD#: 2  41 y/o male s/p fall off ladder with L acetabulum fracture    - fall   - L anterior column posterior hemitransverse acetabulum fracture             OR Friday  Pt will be TDWB x 8 weeks                           Ok for pt to get out of bed with assist and work with PT/OT   - Pain management:             Percocet, Oxy IR   - ABL anemia/Hemodynamics             Stable             Monitor    - Medical issues              Home meds               Nicotine dependence                          Discussed importance of refraining from nicotine use with respect to bone and soft tissue healing   - DVT/PE prophylaxis:             Lovenox              SCDs     - Metabolic Bone Disease:              labs pending    - FEN/GI prophylaxis/Foley/Lines:             Reg diet             IVF    - Impediments to fracture healing:             Nicotine use   - Dispo:             OR Friday for fixation of pelvis             Ok to work with therapies      Mearl Latin, PA-C [facility] [phone] (P) [phone] (O) 02/09/2017, 8:12 AM

## 2017-02-09 NOTE — Care Management (Signed)
    Durable Medical Equipment        Start     Ordered   02/09/17 1109  For home use only DME lightweight manual wheelchair with seat cushion  Once    Comments:  Patient suffers from Left acetabulum fracture which impairs their ability to perform daily activities like ambulating  in the home.  A cane  will not resolve  issue with performing activities of daily living. A wheelchair will allow patient to safely perform daily activities. Patient is not able to propel themselves in the home using a standard weight wheelchair due to Left acetabulum fracture. Patient can self propel in the lightweight wheelchair.  Accessories: elevating leg rests (ELRs), wheel locks, extensions and anti-tippers.   Wheelchair ;Wheelchair cushion  (w/c with elevating leg rests)     02/09/17 1110   02/09/17 1107  For home use only DME 3 n 1  Once     02/09/17 1108   02/09/17 1106  For home use only DME Walker rolling  Once    Question:  Patient needs a walker to treat with the following condition  Answer:  Closed fracture of anterior lip of left acetabulum without additional fracture (HCC)   02/09/17 1108

## 2017-02-09 NOTE — Evaluation (Addendum)
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 Pt landed on his L side and was found to have a L anterior column posterior hemitransverse acetabulum fx. Pt was evaluated by ortho trauma, planning for OR on 5/18 for pelvic fixation. PMH including but not limited to bipolar disorder and anxiety.   Clinical Impression   Pt admitted with above. He demonstrates the below listed deficits and will benefit from continued OT to maximize safety and independence with BADLs.  Pt is very motivated to regain independence.  Currently, he is limited by pain and requires max A for LB ADLs and min A for functional transfers.  Expect good progress.  Will follow.       Follow Up Recommendations  No OT follow up;Supervision - Intermittent    Equipment Recommendations  3 in 1 bedside commode;Wheelchair (measurements OT)    Recommendations for Other Services       Precautions / Restrictions Precautions Precautions: Fall Restrictions Weight Bearing Restrictions: Yes LLE Weight Bearing: Touchdown weight bearing      Mobility Bed Mobility                  Transfers Overall transfer level: Needs assistance Equipment used: Rolling walker (2 wheeled) Transfers: Sit to/from [employer] Transfers Sit to Stand: Min guard;From elevated surface Stand pivot transfers: Min assist       General transfer comment: pt requires increased time.  He demonstrates good safety awareness     Balance Overall balance assessment: Needs assistance Sitting-balance support: Feet supported Sitting balance-Leahy Scale: Good     Standing balance support: During functional activity;Bilateral upper extremity supported Standing balance-Leahy Scale: Poor Standing balance comment: pt reliant on bilateral UEs on RW                           ADL either performed or assessed with clinical judgement   ADL Overall ADL's : Needs assistance/impaired Eating/Feeding: Independent   Grooming: Wash/dry hands;Wash/dry face;Oral care;Applying deodorant;Brushing hair;Set up;Sitting   Upper Body Bathing: Set up;Sitting   Lower Body Bathing: Maximal assistance;Sit to/from stand   Upper Body Dressing : Set up;Sitting   Lower Body Dressing: Total assistance;Sit to/from stand   Toilet Transfer: Minimal assistance;Stand-pivot;BSC;RW   Toileting- Clothing Manipulation and Hygiene: Total assistance;Sit to/from stand       Functional mobility during ADLs: Minimal assistance;Rolling walker General ADL Comments: Pt stood x 25 mins with min guard assist.  He was able to take one step forward and back, but limited by pain.       Vision         Perception     Praxis      Pertinent Vitals/Pain Pain Assessment: 0-10 Pain Score: 7  Pain Location: L hip Pain Descriptors / Indicators: Sore;Grimacing Pain Intervention(s): Repositioned;Monitored during session     Hand Dominance Right   Extremity/Trunk Assessment Upper Extremity Assessment Upper Extremity Assessment: Overall WFL for tasks assessed   Lower Extremity Assessment Lower Extremity Assessment: Defer to PT evaluation   Cervical / Trunk Assessment Cervical / Trunk Assessment: Normal   Communication Communication Communication: No difficulties   Cognition Arousal/Alertness: Awake/alert Behavior During Therapy: WFL for tasks assessed/performed Overall Cognitive Status: Within Functional Limits for tasks assessed  General Comments  fiancee' present     Exercises     Shoulder Instructions      Home Living Family/patient expects to be discharged to:: Private residence Living Arrangements: Spouse/significant other Available Help at Discharge: Family;Available 24 hours/day Type of Home:  House Home Access: Stairs to enter Entergy CorporationEntrance Stairs-Number of Steps: 1 Entrance Stairs-Rails: None Home Layout: One level     Bathroom Shower/Tub: Tub/shower unit;Walk-in Human resources officershower   Bathroom Toilet: Standard     Home Equipment: Shower seat          Prior Functioning/Environment Level of Independence: Independent        Comments: pt works as a Designer, industrial/productcontract painter        OT Problem List: Decreased strength;Decreased activity tolerance;Decreased safety awareness;Decreased knowledge of use of DME or AE;Pain      OT Treatment/Interventions: Self-care/ADL training;DME and/or AE instruction;Therapeutic activities;Patient/family education    OT Goals(Current goals can be found in the care plan section) Acute Rehab OT Goals Patient Stated Goal: to get better  OT Goal Formulation: With patient Time For Goal Achievement: 02/16/17 Potential to Achieve Goals: Good ADL Goals Pt Will Perform Grooming: with modified independence;standing Pt Will Perform Lower Body Bathing: with modified independence;sit to/from stand;with adaptive equipment Pt Will Perform Lower Body Dressing: with modified independence;sit to/from stand;with adaptive equipment Pt Will Transfer to Toilet: with modified independence;ambulating;regular height toilet;bedside commode;grab bars Pt Will Perform Toileting - Clothing Manipulation and hygiene: with modified independence;sit to/from stand Pt Will Perform Tub/Shower Transfer: Tub transfer;with min assist;ambulating;shower seat;rolling walker  OT Frequency: Min 2X/week   Barriers to D/C:            Co-evaluation              AM-PAC PT "6 Clicks" Daily Activity     Outcome Measure Help from another person eating meals?: None Help from another person taking care of personal grooming?: A Little Help from another person toileting, which includes using toliet, bedpan, or urinal?: A Lot Help from another person bathing (including washing, rinsing, drying)?: A  Lot Help from another person to put on and taking off regular upper body clothing?: A Little Help from another person to put on and taking off regular lower body clothing?: A Lot 6 Click Score: 16   End of Session Equipment Utilized During Treatment: Gait belt;Rolling walker Nurse Communication: Mobility status  Activity Tolerance: Patient limited by pain Patient left: in chair;with call bell/phone within reach;with chair alarm set;with family/visitor present  OT Visit Diagnosis: Pain Pain - Right/Left: Left Pain - part of body: Leg;Hip                Time: 1430-1507 OT Time Calculation (min): 37 min Charges:  OT General Charges $OT Visit: 1 Procedure OT Evaluation $OT Eval Low Complexity: 1 Procedure OT Treatments $Therapeutic Activity:8-22 mins G-Codes:     Reynolds AmericanWendi Ansh Fauble, OTR/L [phone]   Jeani HawkingConarpe, Shailynn Fong M 02/09/2017, 4:58 PM

## 2017-02-09 NOTE — Progress Notes (Signed)
Orthopedic Tech Progress Note Patient Details:  [name] 28-Apr-1976 [id number]  Ortho Devices Ortho Device/Splint Location: trapeze bar patient helper Ortho Device/Splint Interventions: Application   [name], [name] 02/09/2017, 8:59 AM

## 2017-02-09 NOTE — Care Management Note (Signed)
Case Management Note  Patient Details  Name: Ailene RavelJason M Vandyke MRN: [id number] Date of Birth: [birth year]  Subjective/Objective:                    Action/Plan:  Lives with girl friend . Plan surgery Friday 02-11-17  percutaneous fixation . Will continue to follow. Expected Discharge Date:                  Expected Discharge Plan:  Home/Self Care  In-House Referral:     Discharge planning Services  CM Consult  Post Acute Care Choice:  Durable Medical Equipment Choice offered to:     DME Arranged:  3-N-1, Lightweight manual wheelchair with seat cushion, Walker rolling DME Agency:  [facility]  HH Arranged:    HH Agency:     Status of Service:  In process, will continue to follow  If discussed at Long Length of Stay Meetings, dates discussed:    Additional Comments:  Kingsley PlanWile, Gowri Suchan Marie, RN 02/09/2017, 11:12 AM

## 2017-02-10 ENCOUNTER — Encounter (HOSPITAL_COMMUNITY): Payer: Self-pay | Admitting: General Practice

## 2017-02-10 LAB — VITAMIN D 25 HYDROXY (VIT D DEFICIENCY, FRACTURES): VIT D 25 HYDROXY: 43.1 ng/mL (ref 30.0–100.0)

## 2017-02-10 LAB — SURGICAL PCR SCREEN
MRSA, PCR: NEGATIVE
Staphylococcus aureus: POSITIVE — AB

## 2017-02-10 MED ORDER — HYDROXYZINE HCL 25 MG PO TABS
50.0000 mg | ORAL_TABLET | Freq: Once | ORAL | Status: AC
  Administered 2017-02-10: 50 mg via ORAL
  Filled 2017-02-10: qty 2

## 2017-02-10 MED ORDER — PROPRANOLOL HCL 20 MG PO TABS
20.0000 mg | ORAL_TABLET | Freq: Four times a day (QID) | ORAL | Status: DC
  Administered 2017-02-10 – 2017-02-14 (×15): 20 mg via ORAL
  Filled 2017-02-10 (×3): qty 1
  Filled 2017-02-10: qty 2
  Filled 2017-02-10: qty 1
  Filled 2017-02-10: qty 2
  Filled 2017-02-10 (×4): qty 1
  Filled 2017-02-10: qty 2
  Filled 2017-02-10 (×4): qty 1

## 2017-02-10 MED ORDER — CEFAZOLIN SODIUM-DEXTROSE 2-4 GM/100ML-% IV SOLN
2.0000 g | Freq: Once | INTRAVENOUS | Status: DC
  Filled 2017-02-10: qty 100

## 2017-02-10 MED ORDER — POTASSIUM CHLORIDE IN NACL 20-0.9 MEQ/L-% IV SOLN
INTRAVENOUS | Status: DC
  Administered 2017-02-10 – 2017-02-12 (×3): via INTRAVENOUS
  Filled 2017-02-10 (×3): qty 1000

## 2017-02-10 MED ORDER — PROPRANOLOL HCL 10 MG PO TABS
20.0000 mg | ORAL_TABLET | Freq: Once | ORAL | Status: AC
  Administered 2017-02-10: 20 mg via ORAL
  Filled 2017-02-10: qty 2

## 2017-02-10 MED ORDER — CEFAZOLIN SODIUM-DEXTROSE 2-4 GM/100ML-% IV SOLN
2.0000 g | INTRAVENOUS | Status: AC
  Administered 2017-02-11: 2 g via INTRAVENOUS
  Filled 2017-02-10: qty 100

## 2017-02-10 MED ORDER — CHLORHEXIDINE GLUCONATE CLOTH 2 % EX PADS
6.0000 | MEDICATED_PAD | Freq: Every day | CUTANEOUS | Status: DC
  Administered 2017-02-12 – 2017-02-14 (×3): 6 via TOPICAL

## 2017-02-10 MED ORDER — HYDROXYZINE HCL 25 MG PO TABS
50.0000 mg | ORAL_TABLET | Freq: Four times a day (QID) | ORAL | Status: DC
  Administered 2017-02-10 – 2017-02-14 (×16): 50 mg via ORAL
  Filled 2017-02-10 (×16): qty 2

## 2017-02-10 MED ORDER — MUPIROCIN 2 % EX OINT
1.0000 "application " | TOPICAL_OINTMENT | Freq: Two times a day (BID) | CUTANEOUS | Status: DC
  Administered 2017-02-10 – 2017-02-14 (×7): 1 via NASAL
  Filled 2017-02-10 (×3): qty 22

## 2017-02-10 NOTE — Anesthesia Preprocedure Evaluation (Addendum)
Anesthesia Evaluation  Patient identified by MRN, date of birth, ID band Patient awake    Reviewed: Allergy & Precautions, NPO status , Patient's Chart, lab work & pertinent test results  Airway Mallampati: II  TM Distance: >3 FB Neck ROM: Full    Dental  (+) Dental Advisory Given, Edentulous Upper, Edentulous Lower   Pulmonary neg pulmonary ROS, Current Smoker,    Pulmonary exam normal        Cardiovascular negative cardio ROS Normal cardiovascular exam     Neuro/Psych Seizures -,  PSYCHIATRIC DISORDERS Anxiety Depression Bipolar Disorder    GI/Hepatic negative GI ROS, Neg liver ROS,   Endo/Other  negative endocrine ROS  Renal/GU negative Renal ROS  negative genitourinary   Musculoskeletal negative musculoskeletal ROS (+)   Abdominal Normal abdominal exam  (+)   Peds  Hematology negative hematology ROS (+)   Anesthesia Other Findings   Reproductive/Obstetrics                          Anesthesia Physical Anesthesia Plan  ASA: II  Anesthesia Plan: General   Post-op Pain Management:    Induction: Intravenous  Airway Management Planned: Oral ETT  Additional Equipment:   Intra-op Plan:   Post-operative Plan: Extubation in OR  Informed Consent: I have reviewed the patients History and Physical, chart, labs and discussed the procedure including the risks, benefits and alternatives for the proposed anesthesia with the patient or authorized representative who has indicated his/her understanding and acceptance.   Dental advisory given  Plan Discussed with: CRNA and Surgeon  Anesthesia Plan Comments:       Anesthesia Quick Evaluation

## 2017-02-10 NOTE — Progress Notes (Signed)
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 Pt landed on his L side and was found to have a L anterior column posterior hemitransverse acetabulum fx. Pt was evaluated by ortho trauma, planning for OR on 5/18 for pelvic fixation. PMH including but not limited to bipolar disorder and anxiety.    PT Comments    Pt progressing well with mobility, he ambulated 8150' with RW, good adherence to TDWB status. Instructed pt in isometric exercises for quads and gluteal muscles to prevent atrophy. Noted plan for surgery tomorrow. Will re-assess Saturday.    Follow Up Recommendations  Supervision/Assistance - 24 hour;Home health PT     Equipment Recommendations  Rolling walker with 5" wheels;3in1 (PT);Wheelchair (measurements PT);Wheelchair cushion (measurements PT) (w/c with elevating leg rests)    Recommendations for Other Services       Precautions / Restrictions Precautions Precautions: Fall Restrictions Weight Bearing Restrictions: Yes LLE Weight Bearing: Touchdown weight bearing    Mobility  Bed Mobility Overal bed mobility: Needs Assistance Bed Mobility: Supine to Sit     Supine to sit: Min guard     General bed mobility comments: instructed pt to self assist LLE with RLE, increased time but no physical assist needed  Transfers Overall transfer level: Needs assistance Equipment used: Rolling walker (2 wheeled) Transfers: Sit to/from Stand Sit to Stand: From elevated surface;Min assist         General transfer comment: increased time, good technique, VCs hand placement, min guard for safety with rise from elevated bed position and min A to rise  Ambulation/Gait Ambulation/Gait assistance: Min guard Ambulation Distance (Feet): 50 Feet Assistive device: Rolling walker (2 wheeled) Gait  Pattern/deviations: Step-to pattern     General Gait Details: good adherence to TDWB LLE, steady with no LOB, distance limited by pain   Stairs            Wheelchair Mobility    Modified Rankin (Stroke Patients Only)       Balance Overall balance assessment: Needs assistance Sitting-balance support: Feet supported Sitting balance-Leahy Scale: Good     Standing balance support: During functional activity;Bilateral upper extremity supported Standing balance-Leahy Scale: Poor Standing balance comment: pt reliant on bilateral UEs on RW                            Cognition Arousal/Alertness: Awake/alert Behavior During Therapy: WFL for tasks assessed/performed Overall Cognitive Status: Within Functional Limits for tasks assessed                                        Exercises General Exercises - Lower Extremity Ankle Circles/Pumps: AROM;10 reps;Supine;Both Quad Sets: AROM;Left;5 reps;Seated Gluteal Sets: AROM;Both;5 reps;Seated    General Comments        Pertinent Vitals/Pain Pain Score: 7  Pain Location: L hip Pain Descriptors / Indicators: Sore;Grimacing Pain Intervention(s): Limited activity within patient's tolerance;Monitored during session;Premedicated before session;Ice applied    Home Living                      Prior Function            PT Goals (current goals can now be found in  the care plan section) Acute Rehab PT Goals Patient Stated Goal: decrease pain, return to PLOF PT Goal Formulation: With patient/family Time For Goal Achievement: 02/23/17 Potential to Achieve Goals: Good Progress towards PT goals: Progressing toward goals    Frequency    Min 5X/week      PT Plan Current plan remains appropriate    Co-evaluation              AM-PAC PT "6 Clicks" Daily Activity  Outcome Measure  Difficulty turning over in bed (including adjusting bedclothes, sheets and blankets)?: A  Little Difficulty moving from lying on back to sitting on the side of the bed? : A Little Difficulty sitting down on and standing up from a chair with arms (e.g., wheelchair, bedside commode, etc,.)?: A Little Help needed moving to and from a bed to chair (including a wheelchair)?: A Little Help needed walking in hospital room?: A Little Help needed climbing 3-5 steps with a railing? : A Lot 6 Click Score: 17    End of Session Equipment Utilized During Treatment: Gait belt Activity Tolerance: Patient limited by pain Patient left: in chair;with call bell/phone within reach;with family/visitor present Nurse Communication: Mobility status PT Visit Diagnosis: Other abnormalities of gait and mobility (R26.89);Pain Pain - Right/Left: Left Pain - part of body: Hip     Time: 1610-9604 PT Time Calculation (min) (ACUTE ONLY): 26 min  Charges:  $Gait Training: 8-22 mins $Therapeutic Exercise: 8-22 mins                    G Codes:          Tamala Ser 02/10/2017, 10:10 AM [phone]

## 2017-02-10 NOTE — Progress Notes (Signed)
Orthopedic Trauma Service Progress Note    Subjective:  Doing well Sitting up in bedside chair NAD Ready for surgery    Review of Systems  Constitutional: Negative for chills and fever.  Respiratory: Negative for shortness of breath.   Cardiovascular: Negative for chest pain and palpitations.  Gastrointestinal: Negative for abdominal pain, nausea and vomiting.  Neurological: Negative for tingling and sensory change.    Objective:   VITALS:   Vitals:   02/09/17 0556 02/09/17 1322 02/09/17 2117 02/10/17 0518  BP: 116/77 119/79 125/72 134/75  Pulse: 64 77 71 84  Resp: 20 18 18 18   Temp: 97.5 F (36.4 C) 98.2 F (36.8 C) 98.2 F (36.8 C) 98 F (36.7 C)  TempSrc: Oral Oral Oral Oral  SpO2: 94% 100% 100% 97%  Weight: 106.7 kg (235 lb 3.2 oz)     Height:        Intake/Output      05/16 0701 - 05/17 0700 05/17 0701 - 05/18 0700   P.O. 480    I.V. (mL/kg) 825 (7.7)    Total Intake(mL/kg) 1305 (12.2)    Urine (mL/kg/hr) 2500 (1)    Total Output 2500     Net -1195            Labs  Results for Steven, [name] (MRN [id number]) as of 02/10/2017 10:13  Ref. Range 02/09/2017 05:04  Vitamin D, 25-Hydroxy Latest Ref Range: 30.0 - 100.0 ng/mL 43.1    PHYSICAL EXAM:  Gen: resting comfortably in chair,NAD Lungs: breathing unlabored Cardiac: regular  Abd: + BS, NTND Pelvis/Left lower extremity              Distal motor and sensory functions intact             Ext is warm             + DP pulse              Swelling well controlled  Assessment/Plan:     Principal Problem:   Closed left acetabular fracture (HCC) Active Problems:   Panic attacks   Nicotine dependence   Anxiety   Anti-infectives    None    .  POD/HD#: 64  41 y/o male s/p fall off ladder with L acetabulum fracture    - fall   - L anterior column posterior hemitransverse acetabulum fracture             OR tomorrow             Pt will be TDWB x 8 weeks    No formal  ROM restrictions post op              Ok for pt to get out of bed with assist and work with PT/OT   - Pain management:             Percocet, Oxy IR    - ABL anemia/Hemodynamics             Stable             Monitor    - Medical issues              Home meds               Nicotine dependence                          Discussed importance of refraining from nicotine use with respect  to bone and soft tissue healing   - DVT/PE prophylaxis:             Lovenox- hold lovenox today, resume post op              SCDs     - Metabolic Bone Disease:              vitamin d looks good   - FEN/GI prophylaxis/Foley/Lines:             Reg diet  NPO after MN              IVF    - Impediments to fracture healing:             Nicotine use   - Dispo:             OR tomorrow   Continue with therapies    Mearl LatinKeith W. Howell Groesbeck, PA-C [facility] [phone] (P) [phone] (O) 02/10/2017, 10:12 AM

## 2017-02-11 ENCOUNTER — Inpatient Hospital Stay (HOSPITAL_COMMUNITY): Payer: Self-pay

## 2017-02-11 ENCOUNTER — Inpatient Hospital Stay (HOSPITAL_COMMUNITY): Payer: Self-pay | Admitting: Anesthesiology

## 2017-02-11 ENCOUNTER — Encounter (HOSPITAL_COMMUNITY)
Admission: EM | Disposition: A | Discharge status: Home / Self Care | Payer: Self-pay | Source: Home / Self Care | Attending: Orthopedic Surgery

## 2017-02-11 ENCOUNTER — Encounter (HOSPITAL_COMMUNITY): Payer: Self-pay | Admitting: Certified Registered Nurse Anesthetist

## 2017-02-11 HISTORY — PX: ORIF ACETABULAR FRACTURE: SHX5029

## 2017-02-11 LAB — CBC
HCT: 38.4 % — ABNORMAL LOW (ref 39.0–52.0)
HEMATOCRIT: 35.8 % — AB (ref 39.0–52.0)
HEMOGLOBIN: 12.2 g/dL — AB (ref 13.0–17.0)
Hemoglobin: 12.8 g/dL — ABNORMAL LOW (ref 13.0–17.0)
MCH: 28.2 pg (ref 26.0–34.0)
MCH: 28.4 pg (ref 26.0–34.0)
MCHC: 33.3 g/dL (ref 30.0–36.0)
MCHC: 34.1 g/dL (ref 30.0–36.0)
MCV: 84.6 fL (ref 78.0–100.0)
MCV: 83.3 fL (ref 78.0–100.0)
PLATELETS: 173 10*3/uL (ref 150–400)
Platelets: 178 10*3/uL (ref 150–400)
RBC: 4.54 MIL/uL (ref 4.22–5.81)
RBC: 4.3 MIL/uL (ref 4.22–5.81)
RDW: 13.3 % (ref 11.5–15.5)
RDW: 13.5 % (ref 11.5–15.5)
WBC: 7.7 10*3/uL (ref 4.0–10.5)
WBC: 12.8 10*3/uL — ABNORMAL HIGH (ref 4.0–10.5)

## 2017-02-11 LAB — COMPREHENSIVE METABOLIC PANEL
ALT: 56 U/L (ref 17–63)
ANION GAP: 8 (ref 5–15)
AST: 38 U/L (ref 15–41)
Albumin: 3.3 g/dL — ABNORMAL LOW (ref 3.5–5.0)
Alkaline Phosphatase: 71 U/L (ref 38–126)
BILIRUBIN TOTAL: 0.8 mg/dL (ref 0.3–1.2)
BUN: 10 mg/dL (ref 6–20)
CHLORIDE: 99 mmol/L — AB (ref 101–111)
CO2: 29 mmol/L (ref 22–32)
Calcium: 8.4 mg/dL — ABNORMAL LOW (ref 8.9–10.3)
Creatinine, Ser: 0.95 mg/dL (ref 0.61–1.24)
Glucose, Bld: 113 mg/dL — ABNORMAL HIGH (ref 65–99)
POTASSIUM: 4.1 mmol/L (ref 3.5–5.1)
Sodium: 136 mmol/L (ref 135–145)
TOTAL PROTEIN: 6 g/dL — AB (ref 6.5–8.1)

## 2017-02-11 LAB — APTT: aPTT: 31 seconds (ref 24–36)

## 2017-02-11 LAB — PROTIME-INR
INR: 1.07
PROTHROMBIN TIME: 13.9 s (ref 11.4–15.2)

## 2017-02-11 SURGERY — OPEN REDUCTION INTERNAL FIXATION (ORIF) ACETABULAR FRACTURE
Anesthesia: General | Site: Pelvis | Laterality: Left

## 2017-02-11 MED ORDER — PHENYLEPHRINE 40 MCG/ML (10ML) SYRINGE FOR IV PUSH (FOR BLOOD PRESSURE SUPPORT)
PREFILLED_SYRINGE | INTRAVENOUS | Status: AC
  Filled 2017-02-11: qty 10

## 2017-02-11 MED ORDER — DEXAMETHASONE SODIUM PHOSPHATE 10 MG/ML IJ SOLN
INTRAMUSCULAR | Status: AC
  Filled 2017-02-11: qty 1

## 2017-02-11 MED ORDER — 0.9 % SODIUM CHLORIDE (POUR BTL) OPTIME
TOPICAL | Status: DC | PRN
  Administered 2017-02-11: 1000 mL

## 2017-02-11 MED ORDER — DEXAMETHASONE SODIUM PHOSPHATE 10 MG/ML IJ SOLN
INTRAMUSCULAR | Status: DC | PRN
  Administered 2017-02-11: 10 mg via INTRAVENOUS

## 2017-02-11 MED ORDER — PHENYLEPHRINE 40 MCG/ML (10ML) SYRINGE FOR IV PUSH (FOR BLOOD PRESSURE SUPPORT)
PREFILLED_SYRINGE | INTRAVENOUS | Status: DC | PRN
  Administered 2017-02-11: 80 ug via INTRAVENOUS

## 2017-02-11 MED ORDER — MIDAZOLAM HCL 5 MG/5ML IJ SOLN
INTRAMUSCULAR | Status: DC | PRN
Start: 2017-02-11 — End: 2017-02-11
  Administered 2017-02-11: 2 mg via INTRAVENOUS

## 2017-02-11 MED ORDER — METOCLOPRAMIDE HCL 5 MG PO TABS: 5.0000 mg | ORAL_TABLET | Freq: Three times a day (TID) | ORAL | Status: DC | PRN

## 2017-02-11 MED ORDER — PROPOFOL 10 MG/ML IV BOLUS
INTRAVENOUS | Status: AC
  Filled 2017-02-11: qty 20

## 2017-02-11 MED ORDER — SUGAMMADEX SODIUM 200 MG/2ML IV SOLN
INTRAVENOUS | Status: AC
  Filled 2017-02-11: qty 2

## 2017-02-11 MED ORDER — LIDOCAINE 2% (20 MG/ML) 5 ML SYRINGE
INTRAMUSCULAR | Status: DC | PRN
  Administered 2017-02-11: 100 mg via INTRAVENOUS

## 2017-02-11 MED ORDER — ONDANSETRON HCL 4 MG/2ML IJ SOLN: 4.0000 mg | Freq: Once | INTRAMUSCULAR | Status: DC | PRN

## 2017-02-11 MED ORDER — HYDROMORPHONE HCL 1 MG/ML IJ SOLN
0.2500 mg | INTRAMUSCULAR | Status: DC | PRN
  Administered 2017-02-11 (×2): 0.5 mg via INTRAVENOUS

## 2017-02-11 MED ORDER — CEFAZOLIN SODIUM-DEXTROSE 2-4 GM/100ML-% IV SOLN
2.0000 g | Freq: Four times a day (QID) | INTRAVENOUS | Status: AC
  Administered 2017-02-11 – 2017-02-12 (×3): 2 g via INTRAVENOUS
  Filled 2017-02-11 (×4): qty 100

## 2017-02-11 MED ORDER — METOCLOPRAMIDE HCL 5 MG/ML IJ SOLN: 5.0000 mg | Freq: Three times a day (TID) | INTRAMUSCULAR | Status: DC | PRN

## 2017-02-11 MED ORDER — ONDANSETRON HCL 4 MG/2ML IJ SOLN
INTRAMUSCULAR | Status: AC
  Filled 2017-02-11: qty 2

## 2017-02-11 MED ORDER — ONDANSETRON HCL 4 MG PO TABS: 4.0000 mg | ORAL_TABLET | Freq: Four times a day (QID) | ORAL | Status: DC | PRN

## 2017-02-11 MED ORDER — SUGAMMADEX SODIUM 200 MG/2ML IV SOLN
INTRAVENOUS | Status: DC | PRN
  Administered 2017-02-11: 200 mg via INTRAVENOUS

## 2017-02-11 MED ORDER — FENTANYL CITRATE (PF) 100 MCG/2ML IJ SOLN
INTRAMUSCULAR | Status: DC | PRN
  Administered 2017-02-11: 150 ug via INTRAVENOUS
  Administered 2017-02-11 (×2): 50 ug via INTRAVENOUS

## 2017-02-11 MED ORDER — LACTATED RINGERS IV SOLN
INTRAVENOUS | Status: DC | PRN
  Administered 2017-02-11 (×2): via INTRAVENOUS

## 2017-02-11 MED ORDER — ROCURONIUM BROMIDE 10 MG/ML (PF) SYRINGE
PREFILLED_SYRINGE | INTRAVENOUS | Status: AC
  Filled 2017-02-11: qty 5

## 2017-02-11 MED ORDER — MEPERIDINE HCL 25 MG/ML IJ SOLN: 6.2500 mg | INTRAMUSCULAR | Status: DC | PRN

## 2017-02-11 MED ORDER — LIDOCAINE 2% (20 MG/ML) 5 ML SYRINGE
INTRAMUSCULAR | Status: AC
  Filled 2017-02-11: qty 5

## 2017-02-11 MED ORDER — MIDAZOLAM HCL 2 MG/2ML IJ SOLN
INTRAMUSCULAR | Status: AC
  Filled 2017-02-11: qty 2

## 2017-02-11 MED ORDER — ROCURONIUM BROMIDE 10 MG/ML (PF) SYRINGE
PREFILLED_SYRINGE | INTRAVENOUS | Status: DC | PRN
  Administered 2017-02-11: 50 mg via INTRAVENOUS

## 2017-02-11 MED ORDER — ONDANSETRON HCL 4 MG/2ML IJ SOLN
INTRAMUSCULAR | Status: DC | PRN
  Administered 2017-02-11: 4 mg via INTRAVENOUS

## 2017-02-11 MED ORDER — HYDROMORPHONE HCL 1 MG/ML IJ SOLN
INTRAMUSCULAR | Status: AC
  Filled 2017-02-11: qty 1

## 2017-02-11 MED ORDER — FENTANYL CITRATE (PF) 250 MCG/5ML IJ SOLN
INTRAMUSCULAR | Status: AC
  Filled 2017-02-11: qty 5

## 2017-02-11 MED ORDER — ENOXAPARIN SODIUM 40 MG/0.4ML ~~LOC~~ SOLN
40.0000 mg | SUBCUTANEOUS | Status: DC
Start: 2017-02-12 — End: 2017-02-14
  Administered 2017-02-12 – 2017-02-14 (×3): 40 mg via SUBCUTANEOUS
  Filled 2017-02-11 (×3): qty 0.4

## 2017-02-11 MED ORDER — ACETAMINOPHEN 10 MG/ML IV SOLN: 1000.0000 mg | Freq: Once | INTRAVENOUS | Status: DC | PRN

## 2017-02-11 MED ORDER — ONDANSETRON HCL 4 MG/2ML IJ SOLN: 4.0000 mg | Freq: Four times a day (QID) | INTRAMUSCULAR | Status: DC | PRN

## 2017-02-11 MED ORDER — PROPOFOL 10 MG/ML IV BOLUS
INTRAVENOUS | Status: DC | PRN
  Administered 2017-02-11: 200 mg via INTRAVENOUS

## 2017-02-11 SURGICAL SUPPLY — 64 items
BIT DRILL 4.9 CANNULATED (BIT) ×1
BIT DRILL CANN QC 4.9 LRG (BIT) IMPLANT
BIT DRILL STEP 3.5 (DRILL) IMPLANT
BLADE CLIPPER SURG (BLADE) IMPLANT
BRUSH SCRUB SURG 4.25 DISP (MISCELLANEOUS) ×6 IMPLANT
CLOSURE WOUND 1/2 X4 (GAUZE/BANDAGES/DRESSINGS) ×1
COVER SURGICAL LIGHT HANDLE (MISCELLANEOUS) ×3 IMPLANT
DRAPE C-ARM 42X72 X-RAY (DRAPES) ×3 IMPLANT
DRAPE C-ARMOR (DRAPES) ×3 IMPLANT
DRAPE INCISE IOBAN 66X45 STRL (DRAPES) ×3 IMPLANT
DRAPE INCISE IOBAN 85X60 (DRAPES) ×3 IMPLANT
DRAPE ORTHO SPLIT 77X108 STRL (DRAPES) ×6
DRAPE SURG ORHT 6 SPLT 77X108 (DRAPES) ×2 IMPLANT
DRAPE U-SHAPE 47X51 STRL (DRAPES) ×3 IMPLANT
DRILL BIT CANNULATED 4.9 (BIT) ×3
DRILL STEP 3.5 (DRILL)
DRSG MEPILEX BORDER 4X12 (GAUZE/BANDAGES/DRESSINGS) IMPLANT
DRSG MEPILEX BORDER 4X4 (GAUZE/BANDAGES/DRESSINGS) ×4 IMPLANT
DRSG MEPILEX BORDER 4X8 (GAUZE/BANDAGES/DRESSINGS) IMPLANT
ELECT BLADE 6.5 EXT (BLADE) ×3 IMPLANT
ELECT REM PT RETURN 9FT ADLT (ELECTROSURGICAL) ×3
ELECTRODE REM PT RTRN 9FT ADLT (ELECTROSURGICAL) ×1 IMPLANT
GLOVE BIO SURGEON STRL SZ7.5 (GLOVE) ×3 IMPLANT
GLOVE BIO SURGEON STRL SZ8 (GLOVE) ×3 IMPLANT
GLOVE BIOGEL PI IND STRL 7.5 (GLOVE) ×1 IMPLANT
GLOVE BIOGEL PI IND STRL 8 (GLOVE) ×1 IMPLANT
GLOVE BIOGEL PI INDICATOR 7.5 (GLOVE) ×2
GLOVE BIOGEL PI INDICATOR 8 (GLOVE) ×2
GOWN STRL REUS W/ TWL LRG LVL3 (GOWN DISPOSABLE) ×2 IMPLANT
GOWN STRL REUS W/ TWL XL LVL3 (GOWN DISPOSABLE) ×2 IMPLANT
GOWN STRL REUS W/TWL LRG LVL3 (GOWN DISPOSABLE) ×6
GOWN STRL REUS W/TWL XL LVL3 (GOWN DISPOSABLE) ×6
GUIDEWIRE ASNIS 3.2 NONCAL (WIRE) ×4 IMPLANT
HANDPIECE INTERPULSE COAX TIP (DISPOSABLE) ×3
KIT BASIN OR (CUSTOM PROCEDURE TRAY) ×3 IMPLANT
KIT ROOM TURNOVER OR (KITS) ×3 IMPLANT
MANIFOLD NEPTUNE II (INSTRUMENTS) ×3 IMPLANT
NS IRRIG 1000ML POUR BTL (IV SOLUTION) ×3 IMPLANT
PACK TOTAL JOINT (CUSTOM PROCEDURE TRAY) ×3 IMPLANT
PAD ARMBOARD 7.5X6 YLW CONV (MISCELLANEOUS) ×6 IMPLANT
RETRIEVER SUT HEWSON (MISCELLANEOUS) ×3 IMPLANT
SCREW CANN 8X115 (Screw) ×1 IMPLANT
SCREW CANN 8X80 (Screw) ×2 IMPLANT
SET HNDPC FAN SPRY TIP SCT (DISPOSABLE) ×1 IMPLANT
SPONGE LAP 18X18 X RAY DECT (DISPOSABLE) IMPLANT
STAPLER VISISTAT 35W (STAPLE) ×3 IMPLANT
STRIP CLOSURE SKIN 1/2X4 (GAUZE/BANDAGES/DRESSINGS) ×2 IMPLANT
SUCTION FRAZIER HANDLE 10FR (MISCELLANEOUS) ×2
SUCTION TUBE FRAZIER 10FR DISP (MISCELLANEOUS) ×1 IMPLANT
SUT ETHILON 2 0 PSLX (SUTURE) ×6 IMPLANT
SUT FIBERWIRE #2 38 T-5 BLUE (SUTURE) ×6
SUT VIC AB 0 CT1 27 (SUTURE) ×3
SUT VIC AB 0 CT1 27XBRD ANBCTR (SUTURE) ×1 IMPLANT
SUT VIC AB 1 CT1 18XCR BRD 8 (SUTURE) ×1 IMPLANT
SUT VIC AB 1 CT1 27 (SUTURE) ×3
SUT VIC AB 1 CT1 27XBRD ANBCTR (SUTURE) ×1 IMPLANT
SUT VIC AB 1 CT1 8-18 (SUTURE) ×3
SUT VIC AB 2-0 CT1 27 (SUTURE) ×3
SUT VIC AB 2-0 CT1 TAPERPNT 27 (SUTURE) ×1 IMPLANT
SUTURE FIBERWR #2 38 T-5 BLUE (SUTURE) ×2 IMPLANT
TOWEL OR 17X24 6PK STRL BLUE (TOWEL DISPOSABLE) ×6 IMPLANT
TOWEL OR 17X26 10 PK STRL BLUE (TOWEL DISPOSABLE) ×6 IMPLANT
TRAY FOLEY W/METER SILVER 16FR (SET/KITS/TRAYS/PACK) IMPLANT
WATER STERILE IRR 1000ML POUR (IV SOLUTION) IMPLANT

## 2017-02-11 NOTE — Progress Notes (Signed)
No new complaints just pain with any movement. Often excruciating.  I discussed with the patient the risks and benefits of surgery, including the possibility of infection, nerve injury, vessel injury, wound breakdown, arthritis, symptomatic hardware, DVT/ PE, loss of motion, and need for further surgery among others.  We also specifically discussed instability and heterotopic bone.  He acknowledged these risks and wished to proceed.  Myrene GalasMichael Keylon Labelle, MD [facility] [phone] (p)

## 2017-02-11 NOTE — Anesthesia Procedure Notes (Signed)
Procedure Name: Intubation Date/Time: 02/11/2017 8:25 AM Performed by: Rise PatienceBELL, Zeidy Tayag T Pre-anesthesia Checklist: Patient identified, Emergency Drugs available, Suction available and Patient being monitored Patient Re-evaluated:Patient Re-evaluated prior to inductionOxygen Delivery Method: Circle System Utilized Preoxygenation: Pre-oxygenation with 100% oxygen Intubation Type: IV induction Ventilation: Mask ventilation without difficulty Laryngoscope Size: Miller and 2 Grade View: Grade I Tube type: Oral Tube size: 7.5 mm Number of attempts: 1 Airway Equipment and Method: Stylet and Oral airway Placement Confirmation: ETT inserted through vocal cords under direct vision,  positive ETCO2 and breath sounds checked- equal and bilateral Secured at: 23 cm Tube secured with: Tape Dental Injury: Teeth and Oropharynx as per pre-operative assessment

## 2017-02-11 NOTE — Brief Op Note (Signed)
02/07/2017 - 02/11/2017  2:16 PM  PATIENT:  Elissa HeftyJason M Bollen  41 y.o. male  PRE-OPERATIVE DIAGNOSIS:  Left Acetabular Fracture, ANTERIOR COLUMN POSTERIOR HEMITRANSVERSE  POST-OPERATIVE DIAGNOSIS: Left Acetabular Fracture, ANTERIOR COLUMN POSTERIOR HEMITRANSVERSE  PROCEDURE:  Procedure(s): OPEN REDUCTION INTERNAL FIXATION (ORIF) ACETABULAR  ANTERIOR COLUMN POSTERIOR HEMITRANSVERSE FRACTURE W/ PERCUTANEOUS FIXATION (Left)  SURGEON:  Surgeon(s) and Role:    Myrene Galas* Demorris Choyce, MD - Primary  ASSISTANTS: none   ANESTHESIA:   general  EBL:  Total I/O In: 1300 [I.V.:1300] Out: 730 [Urine:710; Blood:20]  BLOOD ADMINISTERED:none  DRAINS: none   LOCAL MEDICATIONS USED:  NONE  SPECIMEN:  No Specimen  DISPOSITION OF SPECIMEN:  N/A  COUNTS:  YES  TOURNIQUET:  * No tourniquets in log *  DICTATION: .Other Dictation: Dictation Number [id number]  PLAN OF CARE: Admit to inpatient   PATIENT DISPOSITION:  PACU - hemodynamically stable.   Delay start of Pharmacological VTE agent (>24hrs) due to surgical blood loss or risk of bleeding: no

## 2017-02-11 NOTE — Progress Notes (Signed)
Per patient transfer to 6n, patient verbalizes that per Md he will be going to 5 north. Coordinated transfer. VSS prior to transfer, report given to receiving nurse.

## 2017-02-11 NOTE — Transfer of Care (Signed)
Immediate Anesthesia Transfer of Care Note  Patient: [name]  Procedure(s) Performed: Procedure(s): OPEN REDUCTION INTERNAL FIXATION (ORIF) ACETABULAR FRACTURE W/ PERCUTANEOUS FIXATION (Left)  Patient Location: PACU  Anesthesia Type:General  Level of Consciousness: awake, alert  and oriented  Airway & Oxygen Therapy: Patient Spontanous Breathing  Post-op Assessment: Report given to RN, Post -op Vital signs reviewed and stable and Patient moving all extremities X 4  Post vital signs: Reviewed and stable  Last Vitals:  Vitals:   02/11/17 0602 02/11/17 1040  BP: 124/64   Pulse: 84   Resp: 19   Temp: 36.5 C 36.2 C    Last Pain:  Vitals:   02/11/17 0613  TempSrc:   PainSc: 7          Complications: No apparent anesthesia complications

## 2017-02-11 NOTE — Op Note (Signed)
NAMSteward Ros:  Roskos, Talon               ACCOUNT NO.:  [id number]  MEDICAL RECORD NO.:  [id number]  LOCATION:  [id number]                        FACILITY:  [facility]  PHYSICIAN:  Doralee AlbinoMichael H. Carola FrostHandy, M.D. DATE OF BIRTH:  Aug 07, 1976  DATE OF PROCEDURE:  02/11/2017 DATE OF DISCHARGE:                              OPERATIVE REPORT   PREOPERATIVE DIAGNOSIS:  Left anterior column posterior hemitransverse acetabular fracture.  POSTOPERATIVE DIAGNOSIS:  Left anterior column posterior hemitransverse acetabular fracture.  PROCEDURE:  Open reduction and internal fixation of left anterior column posterior hemitransverse acetabular fracture (involving the dome).   SURGEON:  Doralee AlbinoMichael H. Carola FrostHandy, M.D.  ASSISTANT:  None.  ANESTHESIA:  General.  COMPLICATIONS:  None.  FLUID OUTPUT:  1300 mL out, UOP 710 mL.  ESTIMATED BLOOD LOSS:  20.  DRAINS:  None.  SPECIMENS:  None.  DISPOSITION:  To PACU.  CONDITION:  Stable.  BRIEF SUMMARY AND INDICATION FOR PROCEDURE:  Steward RosJason Leccese is a 41 year old male who fell approximately 6 feet, sustaining a left acetabular fracture with inability to ambulate.  He has been attempting to mobilize bed to chair, but has had excruciating pain doing so.  Plain films and CT scan demonstrated a fracture of the acetabular dome and an anterior column with comminution and a posterior hemitransverse fracture pattern. We discussed with him the risks and benefits of internal fixation including the possibility of infection, nerve injury, vessel injury, DVT, PE, need for further surgery, arthritis, loss of motion, and multiple others, and he did wish to proceed.  BRIEF SUMMARY OF PROCEDURE:  The patient was taken to the operating room where general anesthesia was induced.  His left pelvis was bumped to enable placement of an anterior column screw.  Standard prep and drape were performed.  C-arm was brought in and AP and Judet films obtained to identify the correct starting point for  an anterior column screw.  This was introduced through a 3 cm incision posterior and proximal to the greater trochanter and below the iliac wing.  This was advanced, again checking Judet views as well as inlet to make sure that this was entirely intraosseous and outside the joint.  Once it was driven to the appropriate length, it was measured, over drilled, and then an 8.0 mm cannulated screw from the [employer] [place] set inserted.  We achieved excellent purchase and could visualize some compression at the fracture site along the joint.  Next, a 3 cm incision was made just distal to the anterior-superior iliac spine.  A guide pin was then advanced again using Judet views and in the lateral views towards the ischial spine. It was then measured and an 80 mm screw placed again.  This gave us excellent compression and purchase and could be visualized on the plain films.  Final images showed excellent reduction and apposition through the acetabular dome with acceptable hardware trajectory and length. Wounds were irrigated thoroughly and then closed in standard layered fashion with 2-0 Vicryl and 3-0 nylon.  Sterile gently compressive dressings were applied.  The patient was taken to the PACU in stable condition.  PROGNOSIS:  Mr. Abbe AmsterdamHopkins will be on touchdown weightbearing on the left lower extremity with encouragement not  to push the extremes of his motion.  We are hopeful that in 2 weeks, he will be able to participate and ride with a recumbent bike and begin some side-lying leg raises and heel slides.  He will be on formal pharmacologic DVT prophylaxis with Lovenox while in the hospital and likely transitioned to aspirin given financial constraints, but this will be discussed with the patient.     Doralee Albino. Carola Frost, M.D.     [facility]/MEDQ  D:  02/11/2017  T:  02/11/2017  Job:  [id number]

## 2017-02-11 NOTE — Progress Notes (Signed)
Informed and educated patient of positive Staph aureus infection by PCR and the needed treatment.  Patient verbalized understanding.  Bactroban nasal swab started tonight.  Will inform Dr. Judie PetitM. Handy of this PCR result.

## 2017-02-12 LAB — BASIC METABOLIC PANEL
ANION GAP: 9 (ref 5–15)
BUN: 13 mg/dL (ref 6–20)
CHLORIDE: 101 mmol/L (ref 101–111)
CO2: 27 mmol/L (ref 22–32)
Calcium: 8 mg/dL — ABNORMAL LOW (ref 8.9–10.3)
Creatinine, Ser: 0.97 mg/dL (ref 0.61–1.24)
GFR calc non Af Amer: >60 mL/min (ref >60)
Glucose, Bld: 126 mg/dL — ABNORMAL HIGH (ref 65–99)
Potassium: 4.5 mmol/L (ref 3.5–5.1)
SODIUM: 137 mmol/L (ref 135–145)

## 2017-02-12 MED ORDER — HYDROMORPHONE HCL 1 MG/ML IJ SOLN
0.5000 mg | INTRAMUSCULAR | Status: DC | PRN
  Administered 2017-02-13: 1 mg via INTRAVENOUS
  Filled 2017-02-12 (×3): qty 1

## 2017-02-12 MED ORDER — METHOCARBAMOL 500 MG PO TABS
1000.0000 mg | ORAL_TABLET | Freq: Four times a day (QID) | ORAL | Status: DC
  Administered 2017-02-12 – 2017-02-14 (×8): 1000 mg via ORAL
  Filled 2017-02-12 (×8): qty 2

## 2017-02-12 NOTE — Progress Notes (Signed)
OT Cancellation Note  Patient Details Name: Steven RavelJason M Geoghegan MRN: [id number] DOB: [birth year]   Cancelled Treatment:    Reason Eval/Treat Not Completed: Other (comment) (Recently finished with PT.) Will reattempt.   Raynald KempKathryn Ritta Hammes OTR/L Pager: [phone]   02/12/2017, 10:16 AM

## 2017-02-12 NOTE — Progress Notes (Signed)
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 Pt landed on his L side and was found to have a L anterior column posterior hemitransverse acetabulum fx. Pt was evaluated by ortho trauma, planning for OR on 5/18 for pelvic fixation. PMH including but not limited to bipolar disorder and anxiety. Now s/p ORIF acetabular fracture.   OT comments  Pt progressing towards acute OT goals. Pt completed household distance functional mobility and toilet transfer at min guard level. Reviewed LB ADL education, discussed home setup and tub transfer. D/c plan remains appropriate.  Follow Up Recommendations  No OT follow up;Supervision - Intermittent    Equipment Recommendations  3 in 1 bedside commode;Wheelchair (measurements OT)    Recommendations for Other Services      Precautions / Restrictions Precautions Precautions: Fall Restrictions Weight Bearing Restrictions: Yes LLE Weight Bearing: Touchdown weight bearing       Mobility Bed Mobility Overal bed mobility: Needs Assistance Bed Mobility: Sit to Supine       Sit to supine: Supervision;HOB elevated   General bed mobility comments: HOB elevated. Pt plans to sleep in lift recliner at home.  Transfers Overall transfer level: Needs assistance Equipment used: Rolling walker (2 wheeled) Transfers: Sit to/from Stand Sit to Stand: Min guard         General transfer comment: increased time, good technique, min guard for safety    Balance Overall balance assessment: Needs assistance Sitting-balance support: Feet supported Sitting balance-Leahy Scale: Good     Standing balance support: During functional activity;Bilateral upper extremity supported Standing balance-Leahy Scale: Poor Standing balance comment: pt reliant on bilateral UEs on RW                            ADL either performed or assessed with clinical judgement   ADL Overall ADL's : Needs assistance/impaired                         Toilet Transfer: Min guard;Ambulation;RW (3n1 over toilet)           Functional mobility during ADLs: Min guard;Rolling walker General ADL Comments: Pt completed household distance functional mobility and toilet transfer to 3n1 as detailed above. Initial cues for technique with rw. Good adherance to TDWB. Educated on LB ADL techniques and AE. Discussed shower transfer.      Vision       Perception     Praxis      Cognition Arousal/Alertness: Awake/alert Behavior During Therapy: WFL for tasks assessed/performed Overall Cognitive Status: Within Functional Limits for tasks assessed                                          Exercises     Shoulder Instructions       General Comments      Pertinent Vitals/ Pain       Pain Assessment: 0-10 Pain Score: 5  (at end of session) Pain Location: L hip Pain Descriptors / Indicators: Sore;Grimacing Pain Intervention(s): Monitored during session;Repositioned  Home Living  Prior Functioning/Environment              Frequency  Min 2X/week        Progress Toward Goals  OT Goals(current goals can now be found in the care plan section)  Progress towards OT goals: Progressing toward goals  Acute Rehab OT Goals Patient Stated Goal: decrease pain, return to PLOF OT Goal Formulation: With patient Time For Goal Achievement: 02/16/17 Potential to Achieve Goals: Good ADL Goals Pt Will Perform Grooming: with modified independence;standing Pt Will Perform Lower Body Bathing: with modified independence;sit to/from stand;with adaptive equipment Pt Will Perform Lower Body Dressing: with modified independence;sit to/from stand;with adaptive equipment Pt Will Transfer to Toilet: with  modified independence;ambulating;regular height toilet;bedside commode;grab bars Pt Will Perform Toileting - Clothing Manipulation and hygiene: with modified independence;sit to/from stand Pt Will Perform Tub/Shower Transfer: Tub transfer;with min assist;ambulating;shower seat;rolling walker  Plan Discharge plan remains appropriate    Co-evaluation                 AM-PAC PT "6 Clicks" Daily Activity     Outcome Measure                    End of Session Equipment Utilized During Treatment: Rolling walker  OT Visit Diagnosis: Pain Pain - Right/Left: Left Pain - part of body: Leg;Hip   Activity Tolerance Patient tolerated treatment well   Patient Left in bed;with call bell/phone within reach;with family/visitor present   Nurse Communication          Time: 1610-9604 OT Time Calculation (min): 29 min  Charges: OT General Charges $OT Visit: 1 Procedure OT Treatments $Therapeutic Activity: 23-37 mins     Pilar Grammes 02/12/2017, 4:34 PM

## 2017-02-12 NOTE — Progress Notes (Signed)
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 Pt landed on his L side and was found to have a L anterior column posterior hemitransverse acetabulum fx. Pt was evaluated by ortho trauma, planning for OR on 5/18 for pelvic fixation. PMH including but not limited to bipolar disorder and anxiety. Now s/p ORIF acetabular fracture.    PT Comments    Pt seen for mobility progress and re-evaluation following surgery on 5/18. Pt continues to making steady progress and able to maintain TDWB L LE independently. Pt would continue to benefit from skilled physical therapy services at this time while admitted and after d/c to address the below listed limitations in order to improve overall safety and independence with functional mobility.    Follow Up Recommendations  Supervision/Assistance - 24 hour;Home health PT     Equipment Recommendations  Rolling walker with 5" wheels;Wheelchair (measurements PT);Wheelchair cushion (measurements PT);Other (comment) (w/c with elevating leg rests)    Recommendations for Other Services       Precautions / Restrictions Precautions Precautions: Fall Restrictions Weight Bearing Restrictions: Yes LLE Weight Bearing: Touchdown weight bearing    Mobility  Bed Mobility Overal bed mobility: Modified Independent                Transfers Overall transfer level: Needs assistance Equipment used: Rolling walker (2 wheeled) Transfers: Sit to/from Stand Sit to Stand: Min guard         General transfer comment: increased time, good technique, min guard for safety  Ambulation/Gait Ambulation/Gait assistance: Min guard Ambulation Distance (Feet): 20 Feet Assistive device: Rolling walker (2 wheeled) Gait Pattern/deviations: Step-to pattern Gait velocity: decreased Gait  velocity interpretation: Below normal speed for age/gender General Gait Details: good adherence to TDWB LLE, steady with no LOB, distance limited by pain   Stairs            Wheelchair Mobility    Modified Rankin (Stroke Patients Only)       Balance Overall balance assessment: Needs assistance Sitting-balance support: Feet supported Sitting balance-Leahy Scale: Good     Standing balance support: During functional activity;Bilateral upper extremity supported Standing balance-Leahy Scale: Poor Standing balance comment: pt reliant on bilateral UEs on RW                            Cognition Arousal/Alertness: Awake/alert Behavior During Therapy: WFL for tasks assessed/performed Overall Cognitive Status: Within Functional Limits for tasks assessed                                        Exercises General Exercises - Lower Extremity Ankle Circles/Pumps: AROM;Left;10 reps;Seated Heel Slides: AROM;AAROM;Left;10 reps;Seated    General Comments        Pertinent Vitals/Pain Pain Assessment: 0-10 Pain Score: 8  Pain Location: L hip Pain Descriptors / Indicators: Sore;Grimacing Pain Intervention(s): Monitored during session;Repositioned;Patient requesting pain meds-RN notified    Home Living                      Prior Function            PT Goals (current goals can now be found in the care plan section) Acute Rehab PT  Goals PT Goal Formulation: With patient/family Time For Goal Achievement: 02/23/17 Potential to Achieve Goals: Good Progress towards PT goals: Progressing toward goals    Frequency    Min 5X/week      PT Plan Current plan remains appropriate    Co-evaluation              AM-PAC PT "6 Clicks" Daily Activity  Outcome Measure  Difficulty turning over in bed (including adjusting bedclothes, sheets and blankets)?: None Difficulty moving from lying on back to sitting on the side of the bed? :  None Difficulty sitting down on and standing up from a chair with arms (e.g., wheelchair, bedside commode, etc,.)?: A Little Help needed moving to and from a bed to chair (including a wheelchair)?: A Little Help needed walking in hospital room?: A Little Help needed climbing 3-5 steps with a railing? : A Little 6 Click Score: 20    End of Session Equipment Utilized During Treatment: Gait belt Activity Tolerance: Patient limited by pain Patient left: in chair;with call bell/phone within reach;with family/visitor present Nurse Communication: Mobility status PT Visit Diagnosis: Other abnormalities of gait and mobility (R26.89);Pain Pain - Right/Left: Left Pain - part of body: Hip     Time: 0901-0922 PT Time Calculation (min) (ACUTE ONLY): 21 min  Charges:  $Gait Training: 8-22 mins                    G Codes:       [place], [place], [place] [phone]    Alessandra Bevels Scott Fix 02/12/2017, 10:30 AM

## 2017-02-12 NOTE — Progress Notes (Signed)
Orthopedic Trauma Service Progress Note    Subjective:  Pain slowly improving No specific complaints Worked with PT today  No new issues   Foley removed this am  Has voided since removal w/o difficulty + BM last night   Appetite improved   Review of Systems  Constitutional: Negative for chills and fever.  Respiratory: Negative for shortness of breath and wheezing.   Cardiovascular: Negative for chest pain and palpitations.  Gastrointestinal: Negative for abdominal pain, nausea and vomiting.  Genitourinary: Negative for dysuria.  Neurological: Negative for tingling and sensory change.    Objective:   VITALS:   Vitals:   02/11/17 1227 02/11/17 2213 02/12/17 0450 02/12/17 1025  BP: 124/73 116/64 118/63 116/66  Pulse: 92 100 85 83  Resp: 16     Temp: 98.6 F (37 C) 98 F (36.7 C) 97.8 F (36.6 C)   TempSrc: Oral Oral Oral   SpO2: 95% 96% 100% 98%  Weight:      Height:        Intake/Output      05/18 0701 - 05/19 0700 05/19 0701 - 05/20 0700   P.O. 240 480   I.V. (mL/kg) 2288.3 (21.4)    IV Piggyback 200    Total Intake(mL/kg) 2728.3 (25.6) 480 (4.5)   Urine (mL/kg/hr) 5760 (2.2) 300 (0.6)   Stool 1 (0)    Blood 20 (0)    Total Output 5781 300   Net -3052.7 +180        Stool Occurrence 1 x      LABS  Results for orders placed or performed during the hospital encounter of 02/07/17 (from the past 24 hour(s))  CBC     Status: Abnormal   Collection Time: 02/11/17 11:27 PM  Result Value Ref Range   WBC 12.8 (H) 4.0 - 10.5 K/uL   RBC 4.30 4.22 - 5.81 MIL/uL   Hemoglobin 12.2 (L) 13.0 - 17.0 g/dL   HCT 16.1 (L) 09.6 - 04.5 %   MCV 83.3 78.0 - 100.0 fL   MCH 28.4 26.0 - 34.0 pg   MCHC 34.1 30.0 - 36.0 g/dL   RDW 40.9 81.1 - 91.4 %   Platelets 178 150 - 400 K/uL  Basic metabolic panel     Status: Abnormal   Collection Time: 02/12/17  5:47 AM  Result Value Ref Range   Sodium 137 135 - 145 mmol/L   Potassium 4.5 3.5 - 5.1 mmol/L   Chloride 101 101 - 111 mmol/L   CO2 27 22 - 32 mmol/L   Glucose, Bld 126 (H) 65 - 99 mg/dL   BUN 13 6 - 20 mg/dL   Creatinine, Ser 7.82 0.61 - 1.24 mg/dL   Calcium 8.0 (L) 8.9 - 10.3 mg/dL   GFR calc non Af Amer >60 >60 mL/min   GFR calc Af Amer >60 >60 mL/min   Anion gap 9 5 - 15     PHYSICAL EXAM:   PHYSICAL EXAM:  Gen: resting comfortably in chair,NAD Lungs: breathing unlabored Cardiac: regular  Abd: + BS, NTND Pelvis/Left lower extremity   Dressing L hip c/d/i              Distal motor and sensory functions intact             Ext is warm             + DP pulse              Swelling well controlled  Assessment/Plan: 1 Day Post-Op   Principal Problem:   Closed left acetabular fracture (HCC) Active Problems:   Panic attacks   Nicotine dependence   Anxiety   Anti-infectives    Start     Dose/Rate Route Frequency Ordered Stop   02/11/17 1400  ceFAZolin (ANCEF) IVPB 2g/100 mL premix     2 g 200 mL/hr over 30 Minutes Intravenous Every 6 hours 02/11/17 1202 02/12/17 0614   02/11/17 0800  ceFAZolin (ANCEF) IVPB 2g/100 mL premix  Status:  Discontinued     2 g 200 mL/hr over 30 Minutes Intravenous  Once 02/10/17 1016 02/10/17 2102   02/11/17 0700  ceFAZolin (ANCEF) IVPB 2g/100 mL premix     2 g 200 mL/hr over 30 Minutes Intravenous To [facility]Stay [facility] 02/10/17 2102 02/11/17 0845    .  POD/HD#: 1  41 y/o male s/p fall off ladder with L acetabulum fracture    - fall   - L anterior column posterior hemitransverse acetabulum fracture s/p percutaneous fixation              TDWB x 8 weeks               No formal ROM restrictions, just do not stress end ranges              continue with PT/OT  Dressing changes L hip starting tomorrow as needed    - Pain management:             Percocet, Oxy IR    - ABL anemia/Hemodynamics             Stable             Monitor, CBC tomorrow     - Medical issues              Home meds               Nicotine dependence                           Discussed importance of refraining from nicotine use with respect to bone and soft tissue healing   - DVT/PE prophylaxis:             lovenox x 4 weeks   SCDs      - Metabolic Bone Disease:              vitamin d looks good   - FEN/GI prophylaxis/Foley/Lines:             Reg diet  NSL IV   - Impediments to fracture healing:             Nicotine use   - Dispo:            continue mobilizing with therapies  Likely dc home with HHPT on Monday    Mearl LatinKeith W. Jermery Caratachea, PA-C [facility] [phone] (P) [phone] (O) 02/12/2017, 11:40 AM

## 2017-02-13 LAB — CBC
HEMATOCRIT: 34 % — AB (ref 39.0–52.0)
HEMOGLOBIN: 11 g/dL — AB (ref 13.0–17.0)
MCH: 27.5 pg (ref 26.0–34.0)
MCHC: 32.4 g/dL (ref 30.0–36.0)
MCV: 85 fL (ref 78.0–100.0)
Platelets: 186 10*3/uL (ref 150–400)
RBC: 4 MIL/uL — AB (ref 4.22–5.81)
RDW: 13.7 % (ref 11.5–15.5)
WBC: 11 10*3/uL — ABNORMAL HIGH (ref 4.0–10.5)

## 2017-02-13 LAB — BASIC METABOLIC PANEL
ANION GAP: 6 (ref 5–15)
BUN: 14 mg/dL (ref 6–20)
CALCIUM: 7.9 mg/dL — AB (ref 8.9–10.3)
CHLORIDE: 102 mmol/L (ref 101–111)
CO2: 28 mmol/L (ref 22–32)
Creatinine, Ser: 0.98 mg/dL (ref 0.61–1.24)
GFR calc Af Amer: >60 mL/min (ref >60)
Glucose, Bld: 109 mg/dL — ABNORMAL HIGH (ref 65–99)
POTASSIUM: 4.2 mmol/L (ref 3.5–5.1)
SODIUM: 136 mmol/L (ref 135–145)

## 2017-02-13 MED ORDER — PANTOPRAZOLE SODIUM 40 MG PO TBEC
40.0000 mg | DELAYED_RELEASE_TABLET | Freq: Every day | ORAL | Status: DC
  Administered 2017-02-13 – 2017-02-14 (×2): 40 mg via ORAL
  Filled 2017-02-13 (×2): qty 1

## 2017-02-13 MED ORDER — PANTOPRAZOLE SODIUM 40 MG PO TBEC
40.0000 mg | DELAYED_RELEASE_TABLET | Freq: Once | ORAL | Status: AC
  Administered 2017-02-13: 40 mg via ORAL
  Filled 2017-02-13: qty 1

## 2017-02-13 NOTE — Progress Notes (Signed)
Orthopedic Trauma Service Progress Note    Subjective:  Doing well Pain improved Ambulated very well in hall with PT  ROS As above  Objective:   VITALS:   Vitals:   02/12/17 1942 02/12/17 1951 02/13/17 0403 02/13/17 1056  BP: 128/63  (!) 105/58 122/71  Pulse: 81  79 83  Resp:   18   Temp:  98 F (36.7 C) 97.5 F (36.4 C)   TempSrc:   Oral   SpO2: 99%  96% 98%  Weight:      Height:        Intake/Output      05/19 0701 - 05/20 0700 05/20 0701 - 05/21 0700   P.O. 1440 240   I.V. (mL/kg)     IV Piggyback     Total Intake(mL/kg) 1440 (13.5) 240 (2.2)   Urine (mL/kg/hr) 2675 (1)    Stool 0 (0)    Blood     Total Output 2675     Net -1235 +240        Urine Occurrence 1 x    Stool Occurrence 1 x      LABS  Results for orders placed or performed during the hospital encounter of 02/07/17 (from the past 24 hour(s))  CBC     Status: Abnormal   Collection Time: 02/12/17 11:49 PM  Result Value Ref Range   WBC 11.0 (H) 4.0 - 10.5 K/uL   RBC 4.00 (L) 4.22 - 5.81 MIL/uL   Hemoglobin 11.0 (L) 13.0 - 17.0 g/dL   HCT 95.6 (L) 21.3 - 08.6 %   MCV 85.0 78.0 - 100.0 fL   MCH 27.5 26.0 - 34.0 pg   MCHC 32.4 30.0 - 36.0 g/dL   RDW 57.8 46.9 - 62.9 %   Platelets 186 150 - 400 K/uL  Basic metabolic panel     Status: Abnormal   Collection Time: 02/13/17  3:43 AM  Result Value Ref Range   Sodium 136 135 - 145 mmol/L   Potassium 4.2 3.5 - 5.1 mmol/L   Chloride 102 101 - 111 mmol/L   CO2 28 22 - 32 mmol/L   Glucose, Bld 109 (H) 65 - 99 mg/dL   BUN 14 6 - 20 mg/dL   Creatinine, Ser 5.28 0.61 - 1.24 mg/dL   Calcium 7.9 (L) 8.9 - 10.3 mg/dL   GFR calc non Af Amer >60 >60 mL/min   GFR calc Af Amer >60 >60 mL/min   Anion gap 6 5 - 15     PHYSICAL EXAM:  Gen: resting comfortably in chair,NAD Lungs: breathing unlabored Cardiac: regular  Abd: + BS, NTND Pelvis/Left lower extremity              Dressing L hip c/d/i              Distal motor and  sensory functions intact             Ext is warm             + DP pulse              Swelling well controlled  Assessment/Plan: 2 Days Post-Op   Principal Problem:   Closed left acetabular fracture (HCC) Active Problems:   Panic attacks   Nicotine dependence   Anxiety   Anti-infectives    Start     Dose/Rate Route Frequency Ordered Stop   02/11/17 1400  ceFAZolin (ANCEF) IVPB 2g/100 mL premix     2 g 200 mL/hr  over 30 Minutes Intravenous Every 6 hours 02/11/17 1202 02/12/17 0614   02/11/17 0800  ceFAZolin (ANCEF) IVPB 2g/100 mL premix  Status:  Discontinued     2 g 200 mL/hr over 30 Minutes Intravenous  Once 02/10/17 1016 02/10/17 2102   02/11/17 0700  ceFAZolin (ANCEF) IVPB 2g/100 mL premix     2 g 200 mL/hr over 30 Minutes Intravenous To ShortStay Surgical 02/10/17 2102 02/11/17 0845    .  POD/HD#: 2  41 y/o male s/p fall off ladder with L acetabulum fracture    - fall   - L anterior column posterior hemitransverse acetabulum fracture s/p percutaneous fixation              TDWB x 8 weeks               No formal ROM restrictions, just do not stress end ranges              continue with PT/OT             Dressing changes L hip as needed   - Pain management:             Percocet, Oxy IR    - ABL anemia/Hemodynamics             Stable             - Medical issues              Home meds               Nicotine dependence                          Discussed importance of refraining from nicotine use with respect to bone and soft tissue healing   - DVT/PE prophylaxis:             lovenox x 4 weeks              SCDs      - Metabolic Bone Disease:              vitamin d looks good   - FEN/GI prophylaxis/Foley/Lines:             Reg diet             NSL IV    - Impediments to fracture healing:             Nicotine use   - Dispo:            continue mobilizing with therapies             Likely dc home with HHPT on Monday       Mearl LatinKeith W. Dajanay Northrup,  PA-C [facility] [phone] (P) [phone] (O) 02/13/2017, 12:14 PM

## 2017-02-13 NOTE — Plan of Care (Signed)
Problem: Safety: Goal: Ability to remain free from injury will improve Outcome: Progressing No incidence of falls during this admission. Call bell within reach. Bed in low and locked position. Patient alert and oriented. Clean and clear environment maintained. Nonskid footwear being utilized. 3/4 side rails in place. Patient verbalized understanding of safety instruction.  Problem: Pain Management: Goal: Pain level will decrease Outcome: Progressing Pain is being controlled with by mouth pain medication at this time. Vital signs are stable. No grimacing or moaning present. Patient resting well.

## 2017-02-13 NOTE — Progress Notes (Signed)
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 Pt landed on his L side and was found to have a L anterior column posterior hemitransverse acetabulum fx. Pt was evaluated by ortho trauma, planning for OR on 5/18 for pelvic fixation. PMH including but not limited to bipolar disorder and anxiety. Now s/p ORIF acetabular fracture.    PT Comments    Pt presents with decreased pain and improved mobility this session. Pt is able to get into and out of chair without any physical assistance from clinician and is able to stand with Min guard bordering on supervision. Pt is able to perform increased gait distance this session once clinician raised RW. Pt will not require a WC at home, but will benefit from RW for mobility.     Follow Up Recommendations  Home health PT     Equipment Recommendations  Rolling walker with 5" wheels    Recommendations for Other Services       Precautions / Restrictions Precautions Precautions: Fall Restrictions Weight Bearing Restrictions: Yes LLE Weight Bearing: Touchdown weight bearing    Mobility  Bed Mobility               General bed mobility comments: OOB in recliner when PT arrives  Transfers Overall transfer level: Needs assistance Equipment used: Rolling walker (2 wheeled) Transfers: Sit to/from Stand Sit to Stand: Min guard         General transfer comment: increased time, good technique, min guard for safety  Ambulation/Gait Ambulation/Gait assistance: Min guard Ambulation Distance (Feet): 120 Feet Assistive device: Rolling walker (2 wheeled) Gait Pattern/deviations: Step-to pattern Gait velocity: decreased Gait velocity interpretation: Below normal speed for age/gender General Gait Details: good adherence to TDWB LLE, steady with no LOB. Raised RW  and pt has good upright posture and reports improved comfort with RW.    Stairs            Wheelchair Mobility    Modified Rankin (Stroke Patients Only)       Balance Overall balance assessment: Needs assistance Sitting-balance support: Feet supported Sitting balance-Leahy Scale: Good     Standing balance support: During functional activity;Bilateral upper extremity supported;No upper extremity supported Standing balance-Leahy Scale: Fair Standing balance comment: pt reliant on bilateral UEs on RW with mobility, able to stand statically briefly.                            Cognition Arousal/Alertness: Awake/alert Behavior During Therapy: WFL for tasks assessed/performed Overall Cognitive Status: Within Functional Limits for tasks assessed                                        Exercises General Exercises - Lower Extremity Ankle Circles/Pumps: AROM;Left;10 reps;Seated Quad Sets: AROM;Left;5 reps;Seated Heel Slides: AROM;AAROM;Left;10 reps;Seated    General Comments        Pertinent Vitals/Pain Pain Assessment: 0-10 Pain Score: 3  Pain Location: L hip Pain Descriptors / Indicators: Sore;Grimacing Pain Intervention(s): Monitored during session;Premedicated before session;Repositioned    Home Living                      Prior Function  PT Goals (current goals can now be found in the care plan section) Acute Rehab PT Goals Patient Stated Goal: decrease pain, return to PLOF Progress towards PT goals: Progressing toward goals    Frequency    Min 5X/week      PT Plan Current plan remains appropriate    Co-evaluation              AM-PAC PT "6 Clicks" Daily Activity  Outcome Measure  Difficulty turning over in bed (including adjusting bedclothes, sheets and blankets)?: None Difficulty moving from lying on back to sitting on the side of the bed? : None Difficulty sitting down on and standing up  from a chair with arms (e.g., wheelchair, bedside commode, etc,.)?: A Little Help needed moving to and from a bed to chair (including a wheelchair)?: A Little Help needed walking in hospital room?: A Little Help needed climbing 3-5 steps with a railing? : A Little 6 Click Score: 20    End of Session Equipment Utilized During Treatment: Gait belt Activity Tolerance: Patient tolerated treatment well Patient left: in chair;with call bell/phone within reach;with family/visitor present Nurse Communication: Mobility status PT Visit Diagnosis: Other abnormalities of gait and mobility (R26.89);Pain Pain - Right/Left: Left Pain - part of body: Hip     Time: 1610-9604 PT Time Calculation (min) (ACUTE ONLY): 29 min  Charges:  $Gait Training: 23-37 mins                    G Codes:       Colin Broach PT, DPT  [phone]    Roxy Manns 02/13/2017, 10:02 AM

## 2017-02-14 ENCOUNTER — Encounter (HOSPITAL_COMMUNITY): Payer: Self-pay | Admitting: Orthopedic Surgery

## 2017-02-14 DIAGNOSIS — W19XXXA Unspecified fall, initial encounter: Secondary | ICD-10-CM | POA: Diagnosis present

## 2017-02-14 MED ORDER — ENOXAPARIN (LOVENOX) PATIENT EDUCATION KIT
PACK | Freq: Once | Status: AC
  Administered 2017-02-14: 10:00:00
  Filled 2017-02-14: qty 1

## 2017-02-14 MED ORDER — OXYCODONE-ACETAMINOPHEN 5-325 MG PO TABS: 1.0000 | ORAL_TABLET | Freq: Four times a day (QID) | ORAL | 0 refills | Status: DC | PRN

## 2017-02-14 MED ORDER — DOCUSATE SODIUM 100 MG PO CAPS: 100.0000 mg | ORAL_CAPSULE | Freq: Two times a day (BID) | ORAL | 0 refills | Status: DC

## 2017-02-14 MED ORDER — ENOXAPARIN SODIUM 40 MG/0.4ML ~~LOC~~ SOLN: 40.0000 mg | SUBCUTANEOUS | 0 refills | Status: DC

## 2017-02-14 MED ORDER — METHOCARBAMOL 500 MG PO TABS: 500.0000 mg | ORAL_TABLET | Freq: Four times a day (QID) | ORAL | 0 refills | Status: DC | PRN

## 2017-02-14 NOTE — Care Management Note (Signed)
Case Management Note  Patient Details  Name: Ailene RavelJason M Schlafer MRN: [id number] Date of Birth: 1975-12-19  Subjective/Objective:                    Action/Plan: [facility] Loven[facility]. CM ordered RW fro aptient. He will have family assistance at discharge.   Expected Discharge Date:  02/14/17               Expected Discharge Plan:  Home/Self Care  In-House Referral:     Discharge planning Services  CM Consult, Medication Assistance  Post Acute Care Choice:  Durable Medical Equipment Choice offered to:     DME Arranged:  3-N-1, Lightweight manual wheelchair with seat cushion, Walker rolling DME Agency:  [facility]  HH Arranged:  NA (doesnt qualify for charity [facility] HH Agency:  NA  Status of Service:  Completed, signed off  If discussed at Long Length of Stay Meetings, dates discussed:    Additional Comments:  Durenda GuthrieBrady, Jye Fariss [name], RN 02/14/2017, 1:30 PM

## 2017-02-14 NOTE — Progress Notes (Addendum)
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 Pt landed on his L side and was found to have a L anterior column posterior hemitransverse acetabulum fx. Pt was evaluated by ortho trauma, planning for OR on 5/18 for pelvic fixation. PMH including but not limited to bipolar disorder and anxiety. Now s/p ORIF acetabular fracture.    PT Comments    Pt performed complete review of HEP for carryover at home.  PTA reviewed stair training with patient and family Adult nursemember/caregiver.  Pt able to teach back method to PTA.  Pt is ready to d/c home from a mobility stand point.      Follow Up Recommendations  Home health PT     Equipment Recommendations  Rolling walker with 5" wheels    Recommendations for Other Services       Precautions / Restrictions Precautions Precautions: Fall Restrictions Weight Bearing Restrictions: Yes LLE Weight Bearing: Touchdown weight bearing    Mobility  Bed Mobility               General bed mobility comments: OOB in recliner upon arrival  Transfers Overall transfer level: Needs assistance Equipment used: Rolling walker (2 wheeled) Transfers: Sit to/from Stand Sit to Stand: Supervision Stand pivot transfers: Min guard       General transfer comment: Cues for technique.  Pt slow and guarded.    Ambulation/Gait Ambulation/Gait assistance: Min guard Ambulation Distance (Feet): 6 Feet (to and from curb step.  Pt reports hands are sore from increased ambulation yesterday.  ) Assistive device: Rolling walker (2 wheeled) Gait Pattern/deviations: Step-to pattern;Trunk flexed;Antalgic Gait velocity: decreased Gait velocity interpretation: Below normal speed for age/gender General Gait Details: good adherence to TDWB LLE, steady with no LOB.     Stairs Stairs: Yes    Stair Management: No rails;Backwards;With walker Number of Stairs: 1 General stair comments: Cues for sequencing and RW placement.    Wheelchair Mobility    Modified Rankin (Stroke Patients Only)       Balance Overall balance assessment: Needs assistance Sitting-balance support: Feet supported Sitting balance-Leahy Scale: Good     Standing balance support: During functional activity;Bilateral upper extremity supported;No upper extremity supported Standing balance-Leahy Scale: Fair Standing balance comment: pt reliant on bilateral UEs on RW with mobility, able to stand statically briefly.                            Cognition Arousal/Alertness: Awake/alert Behavior During Therapy: WFL for tasks assessed/performed Overall Cognitive Status: Within Functional Limits for tasks assessed                                        Exercises Total Joint Exercises Ankle Circles/Pumps: AROM;Both;10 reps;Supine Quad Sets: AROM;Left;10 reps;Supine Short Arc Quad: AROM;Left;10 reps;Supine Heel Slides: AROM;Left;10 reps;Supine Hip ABduction/ADduction: AROM;AAROM;Left;20 reps;Supine;Standing (1x10 in standing AROM, 1x10 in supine with AAROM.  ) Knee Flexion: AROM;Left;10 reps;Supine Marching in Standing: AROM;Left;10 reps;Supine Standing Hip Extension: AROM;Left;10 reps;Supine    General Comments General comments (skin integrity, edema, etc.): Wife present for session      Pertinent Vitals/Pain Pain Assessment: 0-10 Pain Score: 7  Faces Pain Scale: Hurts little more Pain Location: L hip Pain Descriptors /  Indicators: Sore;Grimacing Pain Intervention(s): Monitored during session;Repositioned    Home Living                      Prior Function            PT Goals (current goals can now be found in the care plan section) Acute Rehab PT Goals Patient Stated Goal: decrease pain, return to PLOF Potential to Achieve Goals: Good     Frequency    Min 5X/week      PT Plan Current plan remains appropriate    Co-evaluation              AM-PAC PT "6 Clicks" Daily Activity  Outcome Measure  Difficulty turning over in bed (including adjusting bedclothes, sheets and blankets)?: None Difficulty moving from lying on back to sitting on the side of the bed? : None Difficulty sitting down on and standing up from a chair with arms (e.g., wheelchair, bedside commode, etc,.)?: A Little Help needed moving to and from a bed to chair (including a wheelchair)?: A Little Help needed walking in hospital room?: A Little Help needed climbing 3-5 steps with a railing? : A Little 6 Click Score: 20    End of Session Equipment Utilized During Treatment: Gait belt Activity Tolerance: Patient tolerated treatment well Patient left: in chair;with call bell/phone within reach;with family/visitor present Nurse Communication: Mobility status PT Visit Diagnosis: Other abnormalities of gait and mobility (R26.89);Pain Pain - Right/Left: Left Pain - part of body: Hip     Time: 1138-1208 PT Time Calculation (min) (ACUTE ONLY): 30 min  Charges:  $Gait Training: 8-22 mins $Therapeutic Exercise: 8-22 mins                    G Codes:       Joycelyn Rua, PTA pager [phone]    Florestine Avers 02/14/2017, 12:23 PM

## 2017-02-14 NOTE — Progress Notes (Signed)
Orthopedic Trauma Service Progress Note    Subjective:  Doing great Ready to go home  Very appreciative  No new issues   Review of Systems  Constitutional: Negative for chills and fever.  Respiratory: Negative for shortness of breath and wheezing.   Cardiovascular: Negative for chest pain and palpitations.  Gastrointestinal: Negative for abdominal pain, nausea and vomiting.  Neurological: Negative for tingling and sensory change.    Objective:   VITALS:   Vitals:   02/13/17 1340 02/13/17 1532 02/13/17 2000 02/14/17 0531  BP: 121/70 124/81 119/68 (!) 120/57  Pulse: 64 76 75 75  Resp:   18 18  Temp: 97.7 F (36.5 C)  98.5 F (36.9 C) 98.4 F (36.9 C)  TempSrc: Oral  Oral Oral  SpO2: 98%  97% 97%  Weight:      Height:        Intake/Output      05/20 0701 - 05/21 0700 05/21 0701 - 05/22 0700   P.O. 720    Total Intake(mL/kg) 720 (6.7)    Urine (mL/kg/hr)     Stool     Total Output       Net +720          Urine Occurrence 3 x      LABS  No results found for this or any previous visit (from the past 24 hour(s)).   PHYSICAL EXAM:   Gen: resting comfortably in chair,NAD Lungs: breathing unlabored Cardiac: regular  Abd: + BS, NTND Pelvis/Left lower extremity              incisions look fantastic   No erythema   No drainage  New dressings applied              Distal motor and sensory functions intact             Ext is warm             + DP pulse              Swelling well controlled   Assessment/Plan: 3 Days Post-Op   Principal Problem:   Closed left acetabular fracture (HCC) Active Problems:   Panic attacks   Nicotine dependence   Anxiety   Anti-infectives    Start     Dose/Rate Route Frequency Ordered Stop   02/11/17 1400  ceFAZolin (ANCEF) IVPB 2g/100 mL premix     2 g 200 mL/hr over 30 Minutes Intravenous Every 6 hours 02/11/17 1202 02/12/17 0614   02/11/17 0800  ceFAZolin (ANCEF) IVPB 2g/100 mL premix  Status:   Discontinued     2 g 200 mL/hr over 30 Minutes Intravenous  Once 02/10/17 1016 02/10/17 2102   02/11/17 0700  ceFAZolin (ANCEF) IVPB 2g/100 mL premix     2 g 200 mL/hr over 30 Minutes Intravenous To ShortStay Surgical 02/10/17 2102 02/11/17 0845    .  POD/HD#: 1  41 y/o male s/p fall off ladder with L acetabulum fracture    - fall   - L anterior column posterior hemitransverse acetabulum fracture s/p percutaneous fixation              TDWB x 8 weeks               No formal ROM restrictions, just do not stress end ranges              continue with PT/OT             Dressing  changes L hip as needed  Ok to shower    Can clean wounds with soap and water     Do not apply lotions or ointments to wounds    - Pain management:             Percocet, robaxin for DC    - ABL anemia/Hemodynamics             Stable   - Medical issues              Home meds               Nicotine dependence                          Discussed importance of refraining from nicotine use with respect to bone and soft tissue healing   - DVT/PE prophylaxis:             lovenox x 4 weeks - will verify with CM re match program              SCDs      - Metabolic Bone Disease:              vitamin d looks good   - FEN/GI prophylaxis/Foley/Lines:             Reg diet            dc iv    - Impediments to fracture healing:             Nicotine use   - Dispo:            dc home today  Follow up with ortho in 10 days    Steven LatinKeith W. Braxton Vantrease, PA-C [facility] [phone] (P) [phone] (O) 02/14/2017, 9:18 AM

## 2017-02-14 NOTE — Plan of Care (Signed)
Problem: Safety: Goal: Ability to remain free from injury will improve Outcome: Progressing No incidence of falls during this admission. Call bell within reach. Bed in low and locked position. Patient alert and oriented. Nonskid footwear being utilized. 2/4 siderails in place. Patient ambulates well with FWW. Patient verbalized understanding of safety instruction.  Problem: Pain Management: Goal: Pain level will decrease Outcome: Progressing Pain being managed with PO pain medication at this time. Vital signs are stable. Patient resting well.

## 2017-02-14 NOTE — Progress Notes (Signed)
Discharge order received. RN educated patient on his discharge paperwork, and patient verbalized understanding. RN educated patient on lovenox injections and educated patient using the lovenox education box from pharmacy. Patient provided with the lovenox box to take home and a front wheeling walker. Patient's IV removed. Patient provided with printed prescriptions to drop off at his pharmacy. Patient's wife at bedside to take patient home.

## 2017-02-14 NOTE — Discharge Summary (Signed)
Orthopaedic Trauma Service (OTS)  Patient ID: Steven [name] [name] MRN: [id number] DOB/AGE: 1976-09-20 41 y.o.  Admit date: 02/07/2017 Discharge date: 02/14/2017  Admission Diagnoses: Fall at work Left anterior column posterior hemitransverse acetabulum fracture Panic attacks/anxiety Nicotine dependence   Discharge Diagnoses:  Principal Problem:   Closed left acetabular fracture [facility]) Active Problems:   Panic attacks   Nicotine dependence   Anxiety   Procedures Performed: 02/11/2017- Dr. Marcelino Scot Open reduction and internal fixation of left anterior column posterior hemitransverse acetabular fracture (involving the dome).   Discharged Condition: good  Hospital Course:   41 year old white male admitted on 02/07/2017 after sustaining a fall at work. Patient landed on his left hip and had resultant left acetabulum fracture. Patient was initially admitted to another orthopedic surgeon but due to the complexity injury was transferred to the orthopedic trauma service for definitive management. Patient was mobilized preoperatively and did well with this however his fracture pattern was felt to need surgical intervention given the amount of displacement. Fortunately we felt that his fracture was amenable to percutaneous fixation. Patient was covered with Lovenox during his hospital stay for DVT and PE prophylaxis. This was held the day before surgery. Patient was taken to the OR on 02/11/2017 were percutaneous fixation of his left acetabular fracture was performed. After surgery patient was transferred to the PACU for recovery from anesthesia and transferred back to the orthopedic floor for observation and pain control. He also resume therapies. Patient did very well postoperatively. No particular issues or concerns were noted. Patient continued progress well with therapies. Again he was restarted on Lovenox for DVT and PE prophylaxis. We did also obtain medication assistance with Lovenox as he  will require 4 weeks of a DVT coverage with Lovenox. Patient progressed as expected. His pain was well-controlled with IV and then oral medications. Ultimately on postoperative day #3 patient was deemed to be stable for discharge and discharged home with necessary DME. Patient does not have coverage and was unable to obtain a home health PT. Fortunately he is young enough and I do not feel that he require home health PT but we will refer her to outpatient PT once he is fully weightbearing. At the time of discharge patient was voiding without difficulty has had a bowel movement and tolerating a regular diet.  Consults: None  Significant Diagnostic Studies: labs:  Results for Steven [name], [name] (MRN [id number]) as of 02/14/2017 09:51  Ref. Range 02/12/2017 23:49 02/13/2017 50:93  BASIC METABOLIC PANEL Unknown  Rpt (A)  Sodium Latest Ref Range: 135 - 145 mmol/L  136  Potassium Latest Ref Range: 3.5 - 5.1 mmol/L  4.2  Chloride Latest Ref Range: 101 - 111 mmol/L  102  CO2 Latest Ref Range: 22 - 32 mmol/L  28  Glucose Latest Ref Range: 65 - 99 mg/dL  109 (H)  BUN Latest Ref Range: 6 - 20 mg/dL  14  Creatinine Latest Ref Range: 0.61 - 1.24 mg/dL  0.98  Calcium Latest Ref Range: 8.9 - 10.3 mg/dL  7.9 (L)  Anion gap Latest Ref Range: 5 - 15   6  EGFR (African American) Latest Ref Range: >60 mL/min  >60  EGFR (Non-African Amer.) Latest Ref Range: >60 mL/min  >60  WBC Latest Ref Range: 4.0 - 10.5 K/uL 11.0 (H)   RBC Latest Ref Range: 4.22 - 5.81 MIL/uL 4.00 (L)   Hemoglobin Latest Ref Range: 13.0 - 17.0 g/dL 11.0 (L)   HCT Latest Ref Range: 39.0 - 52.0 %  34.0 (L)   MCV Latest Ref Range: 78.0 - 100.0 fL 85.0   MCH Latest Ref Range: 26.0 - 34.0 pg 27.5   MCHC Latest Ref Range: 30.0 - 36.0 g/dL 32.4   RDW Latest Ref Range: 11.5 - 15.5 % 13.7   Platelets Latest Ref Range: 150 - 400 K/uL 186    Results for Steven [name] [name], Steven [name] [name] (MRN [id number]) as of 02/14/2017 09:51  Ref. Range 02/09/2017 05:04  Vitamin D,  25-Hydroxy Latest Ref Range: 30.0 - 100.0 ng/mL 43.1   Treatments: IV hydration, antibiotics: Ancef, analgesia: Dilaudid, Percocet, OxyIR, anticoagulation: Lovenox, therapies: PT, OT and RN and surgery: As above  Discharge Exam:     Orthopedic Trauma Service Progress Note      Subjective:   Doing great Ready to go home  Very appreciative  No new issues    Review of Systems  Constitutional: Negative for chills and fever.  Respiratory: Negative for shortness of breath and wheezing.   Cardiovascular: Negative for chest pain and palpitations.  Gastrointestinal: Negative for abdominal pain, nausea and vomiting.  Neurological: Negative for tingling and sensory change.      Objective:    VITALS:         Vitals:    02/13/17 1340 02/13/17 1532 02/13/17 2000 02/14/17 0531  BP: 121/70 124/81 119/68 (!) 120/57  Pulse: 64 76 75 75  Resp:     18 18  Temp: 97.7 F (36.5 C)   98.5 F (36.9 C) 98.4 F (36.9 C)  TempSrc: Oral   Oral Oral  SpO2: 98%   97% 97%  Weight:          Height:              Intake/Output      05/20 0701 - 05/21 0700 05/21 0701 - 05/22 0700   P.O. 720    Total Intake(mL/kg) 720 (6.7)    Urine (mL/kg/hr)     Stool     Total Output       Net +720          Urine Occurrence 3 x       LABS   Lab Results Last 24 Hours  No results found for this or any previous visit (from the past 24 hour(s)).       PHYSICAL EXAM:    Gen: resting comfortably in chair,NAD Lungs: breathing unlabored Cardiac: regular  Abd: + BS, NTND Pelvis/Left lower extremity              incisions look fantastic                         No erythema                         No drainage             New dressings applied              Distal motor and sensory functions intact             Ext is warm             + DP pulse              Swelling well controlled     Assessment/Plan: 3 Days Post-Op    Principal Problem:   Closed left acetabular fracture (HCC) Active Problems:    Panic attacks   Nicotine dependence  Anxiety               Anti-infectives     Start     Dose/Rate Route Frequency Ordered Stop    02/11/17 1400   ceFAZolin (ANCEF) IVPB 2g/100 mL premix     2 g 200 mL/hr over 30 Minutes Intravenous Every 6 hours 02/11/17 1202 02/12/17 0614    02/11/17 0800   ceFAZolin (ANCEF) IVPB 2g/100 mL premix  Status:  Discontinued     2 g 200 mL/hr over 30 Minutes Intravenous  Once 02/10/17 1016 02/10/17 2102    02/11/17 0700   ceFAZolin (ANCEF) IVPB 2g/100 mL premix     2 g 200 mL/hr over 30 Minutes Intravenous To ShortStay Surgical 02/10/17 2102 02/11/17 0845     .   POD/HD#: 72   41 y/o male s/p fall off ladder with L acetabulum fracture    - fall   - L anterior column posterior hemitransverse acetabulum fracture s/p percutaneous fixation              TDWB x 8 weeks               No formal ROM restrictions, just do not stress end ranges              continue with PT/OT             Dressing changes L hip as needed             Ok to shower                          Can clean wounds with soap and water                                              Do not apply lotions or ointments to wounds    - Pain management:             Percocet, robaxin for DC    - ABL anemia/Hemodynamics             Stable   - Medical issues              Home meds               Nicotine dependence                          Discussed importance of refraining from nicotine use with respect to bone and soft tissue healing   - DVT/PE prophylaxis:             lovenox x 4 weeks - will verify with CM re match program              SCDs      - Metabolic Bone Disease:              vitamin d looks good   - FEN/GI prophylaxis/Foley/Lines:             Reg diet            dc iv    - Impediments to fracture healing:             Nicotine use   - Dispo:            dc  home today             Follow up with ortho in 10 days    Disposition: 01-Home or Self Care  Discharge  Instructions    Call MD / Call 911    Complete by:  As directed    If you experience chest pain or shortness of breath, CALL 911 and be transported to the hospital emergency room.  If you develope a fever above 101 F, pus (white drainage) or increased drainage or redness at the wound, or calf pain, call your surgeon's office.   Constipation Prevention    Complete by:  As directed    Drink plenty of fluids.  Prune juice may be helpful.  You may use a stool softener, such as Colace (over the counter) 100 mg twice a day.  Use MiraLax (over the counter) for constipation as needed.   Diet general    Complete by:  As directed    Discharge instructions    Complete by:  As directed    Orthopaedic Trauma Service Discharge Instructions   General Discharge Instructions  WEIGHT BEARING STATUS: Touchdown weightbearing Left lower extremity   RANGE OF MOTION/ACTIVITY: as tolerated  Wound Care: daily wound care as needed. Ok to leave wounds open to air. Clean with soap and water. See below    Discharge Wound Care Instructions  Do NOT apply any ointments, solutions or lotions to pin sites or surgical wounds.  These prevent needed drainage and even though solutions like hydrogen peroxide kill bacteria, they also damage cells lining the pin sites that help fight infection.  Applying lotions or ointments can keep the wounds moist and can cause them to breakdown and open up as well. This can increase the risk for infection. When in doubt call the office.  Surgical incisions should be dressed daily.  If any drainage is noted, use one layer of adaptic, then gauze, Kerlix, and an ace wrap.  Once the incision is completely dry and without drainage, it may be left open to air out.  Showering may begin 36-48 hours later.  Cleaning gently with soap and water.  Traumatic wounds should be dressed daily as well.    One layer of adaptic, gauze, Kerlix, then ace wrap.  The adaptic can be discontinued once the  draining has ceased    If you have a wet to dry dressing: wet the gauze with saline the squeeze as much saline out so the gauze is moist (not soaking wet), place moistened gauze over wound, then place a dry gauze over the moist one, followed by Kerlix wrap, then ace wrap.   PAIN MEDICATION USE AND EXPECTATIONS  You have likely been given narcotic medications to help control your pain.  After a traumatic event that results in an fracture (broken bone) with or without surgery, it is ok to use narcotic pain medications to help control one's pain.  We understand that everyone responds to pain differently and each individual patient will be evaluated on a regular basis for the continued need for narcotic medications. Ideally, narcotic medication use should last no more than 6-8 weeks (coinciding with fracture healing).   As a patient it is your responsibility as well to monitor narcotic medication use and report the amount and frequency you use these medications when you come to your office visit.   We would also advise that if you are using narcotic medications, you should take a dose prior to therapy to maximize you participation.  IF YOU ARE ON NARCOTIC MEDICATIONS IT IS NOT PERMISSIBLE TO OPERATE A MOTOR VEHICLE (MOTORCYCLE/CAR/TRUCK/MOPED) OR HEAVY MACHINERY DO NOT MIX NARCOTICS WITH OTHER CNS (CENTRAL NERVOUS SYSTEM) DEPRESSANTS SUCH AS ALCOHOL  Diet: as you were eating previously.  Can use over the counter stool softeners and bowel preparations, such as Miralax, to help with bowel movements.  Narcotics can be constipating.  Be sure to drink plenty of fluids    STOP SMOKING OR USING NICOTINE PRODUCTS!!!!  As discussed nicotine severely impairs your body's ability to heal surgical and traumatic wounds but also impairs bone healing.  Wounds and bone heal by forming microscopic blood vessels (angiogenesis) and nicotine is a vasoconstrictor (essentially, shrinks blood vessels).  Therefore, if  vasoconstriction occurs to these microscopic blood vessels they essentially disappear and are unable to deliver necessary nutrients to the healing tissue.  This is one modifiable factor that you can do to dramatically increase your chances of healing your injury.    (This means no smoking, no nicotine gum, patches, etc)  DO NOT USE NONSTEROIDAL ANTI-INFLAMMATORY DRUGS (NSAID'S)  Using products such as Advil (ibuprofen), Aleve (naproxen), Motrin (ibuprofen) for additional pain control during fracture healing can delay and/or prevent the healing response.  If you would like to take over the counter (OTC) medication, Tylenol (acetaminophen) is ok.  However, some narcotic medications that are given for pain control contain acetaminophen as well. Therefore, you should not exceed more than 4000 mg of tylenol in a day if you do not have liver disease.  Also note that there are may OTC medicines, such as cold medicines and allergy medicines that my contain tylenol as well.  If you have any questions about medications and/or interactions please ask your doctor/PA or your pharmacist.      ICE AND ELEVATE INJURED/OPERATIVE EXTREMITY  Using ice and elevating the injured extremity above your heart can help with swelling and pain control.  Icing in a pulsatile fashion, such as 20 minutes on and 20 minutes off, can be followed.    Do not place ice directly on skin. Make sure there is a barrier between to skin and the ice pack.    Using frozen items such as frozen peas works well as the conform nicely to the are that needs to be iced.  USE AN ACE WRAP OR TED HOSE FOR SWELLING CONTROL  In addition to icing and elevation, Ace wraps or TED hose are used to help limit and resolve swelling.  It is recommended to use Ace wraps or TED hose until you are informed to stop.    When using Ace Wraps start the wrapping distally (farthest away from the body) and wrap proximally (closer to the body)   Example: If you had surgery on  your leg or thing and you do not have a splint on, start the ace wrap at the toes and work your way up to the thigh        If you had surgery on your upper extremity and do not have a splint on, start the ace wrap at your fingers and work your way up to the upper arm  IF YOU ARE IN A SPLINT OR CAST DO NOT [place]   If your splint gets wet for any reason please contact the office immediately. You may shower in your splint or cast as long as you keep it dry.  This can be done by wrapping in a cast cover or garbage back (or  similar)  Do Not stick any thing down your splint or cast such as pencils, money, or hangers to try and scratch yourself with.  If you feel itchy take benadryl as prescribed on the bottle for itching  IF YOU ARE IN A CAM BOOT (BLACK BOOT)  You may remove boot periodically. Perform daily dressing changes as noted below.  Wash the liner of the boot regularly and wear a sock when wearing the boot. It is recommended that you sleep in the boot until told otherwise  CALL THE OFFICE WITH ANY QUESTIONS OR CONCERNS: [phone]   Driving restrictions    Complete by:  As directed    No driving   Increase activity slowly as tolerated    Complete by:  As directed    Lifting restrictions    Complete by:  As directed    No lifting   Touch down weight bearing    Complete by:  As directed    Laterality:  left   Extremity:  Lower     Allergies as of 02/14/2017      Reactions   Haloperidol And Related Other (See Comments)   Locks up muscles   Asa [aspirin] Rash   Buspirone Rash   Zyprexa [olanzapine] Rash      Medication List    STOP taking these medications   hydrOXYzine 25 MG tablet Commonly known as:  ATARAX/VISTARIL   ibuprofen 200 MG tablet Commonly known as:  ADVIL,MOTRIN   ketorolac 0.4 % Soln Commonly known as:  ACULAR   LORazepam 1 MG tablet Commonly known as:  ATIVAN   QUEtiapine 200 MG tablet Commonly known as:  SEROQUEL   QUEtiapine 25  MG tablet Commonly known as:  SEROQUEL     TAKE these medications   docusate sodium 100 MG capsule Commonly known as:  COLACE Take 1 capsule (100 mg total) by mouth 2 (two) times daily.   ECHINACEA PO Take 1 capsule by mouth daily as needed (immune system boost).   enoxaparin 40 MG/0.4ML injection Commonly known as:  LOVENOX Inject 0.4 mLs (40 mg total) into the skin daily. Start taking on:  02/15/2017   hydrOXYzine 50 MG capsule Commonly known as:  VISTARIL Take 50 mg by mouth 4 (four) times daily.   methocarbamol 500 MG tablet Commonly known as:  ROBAXIN Take 1-2 tablets (500-1,000 mg total) by mouth every 6 (six) hours as needed for muscle spasms.   oxyCODONE-acetaminophen 5-325 MG tablet Commonly known as:  PERCOCET/ROXICET Take 1-2 tablets by mouth every 6 (six) hours as needed for moderate pain or severe pain.   propranolol 20 MG tablet Commonly known as:  INDERAL Take 1 tablet (20 mg total) by mouth 3 (three) times daily. What changed:  when to take this   ranitidine 150 MG tablet Commonly known as:  ZANTAC Take 150 mg by mouth every morning.   traZODone 100 MG tablet Commonly known as:  DESYREL Take 100-200 mg by mouth at bedtime. What changed:  Another medication with the same name was removed. Continue taking this medication, and follow the directions you see here.            Durable Medical Equipment        Start     Ordered   02/14/17 [phone]  For home use only DME Crutches  Once     02/14/17 0917   02/09/17 1109  For home use only DME lightweight manual wheelchair with seat cushion  Once    Comments:  Patient suffers from Left acetabulum fracture which impairs their ability to perform daily activities like ambulating  in the home.  A cane  will not resolve  issue with performing activities of daily living. A wheelchair will allow patient to safely perform daily activities. Patient is not able to propel themselves in the home using a standard weight  wheelchair due to Left acetabulum fracture. Patient can self propel in the lightweight wheelchair.  Accessories: elevating leg rests (ELRs), wheel locks, extensions and anti-tippers.   Wheelchair ;Wheelchair cushion  (w/c with elevating leg rests)     02/09/17 1110   02/09/17 1107  For home use only DME 3 n 1  Once     02/09/17 1108   02/09/17 1106  For home use only DME Walker rolling  Once    Question:  Patient needs a walker to treat with the following condition  Answer:  Closed fracture of anterior lip of left acetabulum without additional fracture ([place])   02/09/17 1108       Discharge Instructions and Plan:  41 y/o male s/p fall off ladder with L acetabulum fracture    - fall   - L anterior column posterior hemitransverse acetabulum fracture s/p percutaneous fixation              TDWB x 8 weeks               No formal ROM restrictions, just do not stress end ranges              continue with PT/OT             Dressing changes L hip as needed             Ok to shower                          Can clean wounds with soap and water                                              Do not apply lotions or ointments to wounds    Can continue with ice as needed   - Pain management:             Percocet, robaxin for DC    - ABL anemia/Hemodynamics             Stable   - Medical issues              Home meds               Nicotine dependence                          Discussed importance of refraining from nicotine use with respect to bone and soft tissue healing   - DVT/PE prophylaxis:             lovenox x 4 weeks - will verify with CM re match program              SCDs      - Metabolic Bone Disease:              vitamin d looks good   - FEN/GI prophylaxis/Foley/Lines:             Reg diet  dc iv    - Impediments to fracture healing:             Nicotine use   - Dispo:            dc home today             Follow up with ortho in 10 days     Signed:  Jari Pigg, PA-C [facility] [phone] (P) 02/14/2017, 9:47 AM

## 2017-02-14 NOTE — Progress Notes (Signed)
Occupational Therapy Treatment Patient Details Name: [name] MRN: [id number] DOB: [birth year] Today's Date: 02/14/2017    History of present illness Pt is a 41 y/o male admitted secondary to fall ~6' from ladder while painting. Pt landed on his L side and was found to have a L anterior column posterior hemitransverse acetabulum fx. Pt was evaluated by ortho trauma, planning for OR on 5/18 for pelvic fixation. PMH including but not limited to bipolar disorder and anxiety. Now s/p ORIF acetabular fracture.   OT comments  Pt demonstrating progress towards established goals. Educated pt on tub transfer with tub bench; pt demonstrated understanding and use safe technique. Recommended pt have wife present for bathing and wife agreed. Pt demonstrated good adherence to TDWB precautions during functional mobility and ADLs. Answered all pt questions and provided education in preparation for dc home today. Will continue to follow acutely as admitted to optimize safety and independence.    Follow Up Recommendations  No OT follow up;Supervision - Intermittent    Equipment Recommendations  3 in 1 bedside commode;Wheelchair (measurements OT)    Recommendations for Other Services      Precautions / Restrictions Precautions Precautions: Fall Restrictions Weight Bearing Restrictions: Yes LLE Weight Bearing: Touchdown weight bearing       Mobility Bed Mobility               General bed mobility comments: OOB in recliner upon arrival  Transfers Overall transfer level: Needs assistance Equipment used: Rolling walker (2 wheeled) Transfers: Sit to/from Stand Sit to Stand: Min guard Stand pivot transfers: Min guard       General transfer comment: increased time, good technique, min guard for safety    Balance Overall balance assessment: Needs assistance Sitting-balance support: Feet supported Sitting balance-Leahy Scale: Good     Standing balance support: During functional  activity;Bilateral upper extremity supported;No upper extremity supported Standing balance-Leahy Scale: Fair Standing balance comment: pt reliant on bilateral UEs on RW with mobility, able to stand statically briefly.                           ADL either performed or assessed with clinical judgement   ADL Overall ADL's : Needs assistance/impaired                     Lower Body Dressing: Sit to/from stand;Moderate assistance Lower Body Dressing Details (indicate cue type and reason): Pt able to reach down to adjust socks. Reports that wife will A at home Toilet Transfer: Min guard;Ambulation;RW (3n1 over toilet)   Toileting- Clothing Manipulation and Hygiene: Sit to/from stand;Min guard Toileting - Clothing Manipulation Details (indicate cue type and reason): Pt performed toilet hygiene after urination at toilet. Min gaurd for safety Tub/ Engineer, structural: Tub transfer;Tub Field seismologist Details (indicate cue type and reason): Pt performed tub transfer with safe technique Functional mobility during ADLs: Min guard;Rolling walker General ADL Comments: Pt demosntrating good adherance to TDWB precautions during ADLs and fucntional mobility. Educated on tub transfer, LB dressing, and toileting.      Vision       Perception     Praxis      Cognition Arousal/Alertness: Awake/alert Behavior During Therapy: WFL for tasks assessed/performed Overall Cognitive Status: Within Functional Limits for tasks assessed  Exercises     Shoulder Instructions       General Comments Wife present for session    Pertinent Vitals/ Pain       Pain Assessment: Faces Faces Pain Scale: Hurts little more Pain Location: L hip Pain Descriptors / Indicators: Sore;Grimacing Pain Intervention(s): Monitored during session;Patient requesting pain meds-RN notified  Home Living                                           Prior Functioning/Environment              Frequency  Min 2X/week        Progress Toward Goals  OT Goals(current goals can now be found in the care plan section)  Progress towards OT goals: Progressing toward goals  Acute Rehab OT Goals Patient Stated Goal: decrease pain, return to PLOF OT Goal Formulation: With patient Time For Goal Achievement: 02/16/17 Potential to Achieve Goals: Good ADL Goals Pt Will Perform Grooming: with modified independence;standing Pt Will Perform Lower Body Bathing: with modified independence;sit to/from stand;with adaptive equipment Pt Will Perform Lower Body Dressing: with modified independence;sit to/from stand;with adaptive equipment Pt Will Transfer to Toilet: with modified independence;ambulating;regular height toilet;bedside commode;grab bars Pt Will Perform Toileting - Clothing Manipulation and hygiene: with modified independence;sit to/from stand Pt Will Perform Tub/Shower Transfer: Tub transfer;with min assist;ambulating;shower seat;rolling walker  Plan Discharge plan remains appropriate    Co-evaluation                 AM-PAC PT "6 Clicks" Daily Activity     Outcome Measure   Help from another person eating meals?: None Help from another person taking care of personal grooming?: A Little Help from another person toileting, which includes using toliet, bedpan, or urinal?: A Lot Help from another person bathing (including washing, rinsing, drying)?: A Lot Help from another person to put on and taking off regular upper body clothing?: A Little Help from another person to put on and taking off regular lower body clothing?: A Lot 6 Click Score: 16    End of Session Equipment Utilized During Treatment: Rolling walker;Gait belt  OT Visit Diagnosis: Pain Pain - Right/Left: Left Pain - part of body: Leg;Hip   Activity Tolerance Patient tolerated treatment well   Patient Left  with call bell/phone within reach;with family/visitor present;in chair   Nurse Communication Mobility status;Patient requests pain meds        Time: [id number] OT Time Calculation (min): 29 min  Charges: OT General Charges $OT Visit: 1 Procedure OT Treatments $Self Care/Home Management : 23-37 mins  [name], OTR/L [phone]   [name] 02/14/2017, 11:44 AM

## 2017-02-14 NOTE — Discharge Instructions (Signed)
Orthopaedic Trauma Service Discharge Instructions   General Discharge Instructions  WEIGHT BEARING STATUS: Touchdown weightbearing Left lower extremity   RANGE OF MOTION/ACTIVITY: as tolerated  Wound Care: daily wound care as needed. Ok to leave wounds open to air. Clean with soap and water. See below    Discharge Wound Care Instructions  Do NOT apply any ointments, solutions or lotions to pin sites or surgical wounds.  These prevent needed drainage and even though solutions like hydrogen peroxide kill bacteria, they also damage cells lining the pin sites that help fight infection.  Applying lotions or ointments can keep the wounds moist and can cause them to breakdown and open up as well. This can increase the risk for infection. When in doubt call the office.  Surgical incisions should be dressed daily.  If any drainage is noted, use one layer of adaptic, then gauze, Kerlix, and an ace wrap.  Once the incision is completely dry and without drainage, it may be left open to air out.  Showering may begin 36-48 hours later.  Cleaning gently with soap and water.  Traumatic wounds should be dressed daily as well.    One layer of adaptic, gauze, Kerlix, then ace wrap.  The adaptic can be discontinued once the draining has ceased    If you have a wet to dry dressing: wet the gauze with saline the squeeze as much saline out so the gauze is moist (not soaking wet), place moistened gauze over wound, then place a dry gauze over the moist one, followed by Kerlix wrap, then ace wrap.   PAIN MEDICATION USE AND EXPECTATIONS  You have likely been given narcotic medications to help control your pain.  After a traumatic event that results in an fracture (broken bone) with or without surgery, it is ok to use narcotic pain medications to help control one's pain.  We understand that everyone responds to pain differently and each individual patient will be evaluated on a regular basis for the continued need  for narcotic medications. Ideally, narcotic medication use should last no more than 6-8 weeks (coinciding with fracture healing).   As a patient it is your responsibility as well to monitor narcotic medication use and report the amount and frequency you use these medications when you come to your office visit.   We would also advise that if you are using narcotic medications, you should take a dose prior to therapy to maximize you participation.  IF YOU ARE ON NARCOTIC MEDICATIONS IT IS NOT PERMISSIBLE TO OPERATE A MOTOR VEHICLE (MOTORCYCLE/CAR/TRUCK/MOPED) OR HEAVY MACHINERY DO NOT MIX NARCOTICS WITH OTHER CNS (CENTRAL NERVOUS SYSTEM) DEPRESSANTS SUCH AS ALCOHOL  Diet: as you were eating previously.  Can use over the counter stool softeners and bowel preparations, such as Miralax, to help with bowel movements.  Narcotics can be constipating.  Be sure to drink plenty of fluids    STOP SMOKING OR USING NICOTINE PRODUCTS!!!!  As discussed nicotine severely impairs your body's ability to heal surgical and traumatic wounds but also impairs bone healing.  Wounds and bone heal by forming microscopic blood vessels (angiogenesis) and nicotine is a vasoconstrictor (essentially, shrinks blood vessels).  Therefore, if vasoconstriction occurs to these microscopic blood vessels they essentially disappear and are unable to deliver necessary nutrients to the healing tissue.  This is one modifiable factor that you can do to dramatically increase your chances of healing your injury.    (This means no smoking, no nicotine gum, patches, etc)  DO NOT  USE NONSTEROIDAL ANTI-INFLAMMATORY DRUGS (NSAID'S)  Using products such as Advil (ibuprofen), Aleve (naproxen), Motrin (ibuprofen) for additional pain control during fracture healing can delay and/or prevent the healing response.  If you would like to take over the counter (OTC) medication, Tylenol (acetaminophen) is ok.  However, some narcotic medications that are given  for pain control contain acetaminophen as well. Therefore, you should not exceed more than 4000 mg of tylenol in a day if you do not have liver disease.  Also note that there are may OTC medicines, such as cold medicines and allergy medicines that my contain tylenol as well.  If you have any questions about medications and/or interactions please ask your doctor/PA or your pharmacist.      ICE AND ELEVATE INJURED/OPERATIVE EXTREMITY  Using ice and elevating the injured extremity above your heart can help with swelling and pain control.  Icing in a pulsatile fashion, such as 20 minutes on and 20 minutes off, can be followed.    Do not place ice directly on skin. Make sure there is a barrier between to skin and the ice pack.    Using frozen items such as frozen peas works well as the conform nicely to the are that needs to be iced.  USE AN ACE WRAP OR TED HOSE FOR SWELLING CONTROL  In addition to icing and elevation, Ace wraps or TED hose are used to help limit and resolve swelling.  It is recommended to use Ace wraps or TED hose until you are informed to stop.    When using Ace Wraps start the wrapping distally (farthest away from the body) and wrap proximally (closer to the body)   Example: If you had surgery on your leg or thing and you do not have a splint on, start the ace wrap at the toes and work your way up to the thigh        If you had surgery on your upper extremity and do not have a splint on, start the ace wrap at your fingers and work your way up to the upper arm  IF YOU ARE IN A SPLINT OR CAST DO NOT REMOVE IT FOR ANY REASON   If your splint gets wet for any reason please contact the office immediately. You may shower in your splint or cast as long as you keep it dry.  This can be done by wrapping in a cast cover or garbage back (or similar)  Do Not stick any thing down your splint or cast such as pencils, money, or hangers to try and scratch yourself with.  If you feel itchy take  benadryl as prescribed on the bottle for itching  IF YOU ARE IN A CAM BOOT (BLACK BOOT)  You may remove boot periodically. Perform daily dressing changes as noted below.  Wash the liner of the boot regularly and wear a sock when wearing the boot. It is recommended that you sleep in the boot until told otherwise  CALL THE OFFICE WITH ANY QUESTIONS OR CONCERNS: [phone]

## 2017-02-18 NOTE — Anesthesia Postprocedure Evaluation (Addendum)
Anesthesia Post Note  Patient: Steven HeftyJason M [name]  Procedure(s) Performed: Procedure(s) (LRB): OPEN REDUCTION INTERNAL FIXATION (ORIF) ACETABULAR FRACTURE W/ PERCUTANEOUS FIXATION (Left)  Patient location during evaluation: PACU Anesthesia Type: General Level of consciousness: awake and sedated Pain management: pain level controlled Vital Signs Assessment: post-procedure vital signs reviewed and stable Respiratory status: spontaneous breathing Cardiovascular status: stable Postop Assessment: no signs of nausea or vomiting Anesthetic complications: no        Last Vitals:  Vitals:   02/13/17 2000 02/14/17 0531  BP: 119/68 (!) 120/57  Pulse: 75 75  Resp: 18 18  Temp: 36.9 C 36.9 C    Last Pain:  Vitals:   02/14/17 1138  TempSrc:   PainSc: 7    Pain Goal: Patients Stated Pain Goal: 4 (02/12/17 1732)               Barbie Croston JR,JOHN Susann GivensFRANKLIN

## 2017-03-04 NOTE — Addendum Note (Signed)
Addendum  created 03/04/17 1248 by Leilani AbleHatchett, Kris No, MD   Sign clinical note

## 2019-05-24 ENCOUNTER — Other Ambulatory Visit: Payer: Self-pay

## 2019-05-24 ENCOUNTER — Emergency Department (HOSPITAL_COMMUNITY)
Admission: EM | Admit: 2019-05-24 | Discharge: 2019-05-25 | Disposition: A | Discharge status: Home / Self Care | Payer: Self-pay | Attending: Emergency Medicine | Admitting: Emergency Medicine

## 2019-05-24 ENCOUNTER — Encounter (HOSPITAL_COMMUNITY): Payer: Self-pay

## 2019-05-24 DIAGNOSIS — Z79899 Other long term (current) drug therapy: Secondary | ICD-10-CM | POA: Insufficient documentation

## 2019-05-24 DIAGNOSIS — L02811 Cutaneous abscess of head [any part, except face]: Secondary | ICD-10-CM | POA: Insufficient documentation

## 2019-05-24 DIAGNOSIS — F17210 Nicotine dependence, cigarettes, uncomplicated: Secondary | ICD-10-CM | POA: Insufficient documentation

## 2019-05-24 NOTE — ED Triage Notes (Signed)
Pt reports pain in the L side of his head right behind his ear. He states that when he pushes on it, it tingles up towards his ear. He denies vision changes, denies N/V/D. States that it has been worsening over the last 3 days.

## 2019-05-25 ENCOUNTER — Other Ambulatory Visit: Payer: Self-pay

## 2019-05-25 MED ORDER — PROCHLORPERAZINE EDISYLATE 10 MG/2ML IJ SOLN
10.0000 mg | Freq: Once | INTRAMUSCULAR | Status: AC
  Administered 2019-05-25: 02:00:00 10 mg via INTRAMUSCULAR
  Filled 2019-05-25: qty 2

## 2019-05-25 MED ORDER — DOXYCYCLINE HYCLATE 100 MG PO CAPS: 100.0000 mg | ORAL_CAPSULE | Freq: Two times a day (BID) | ORAL | 0 refills | Status: AC

## 2019-05-25 MED ORDER — BUPIVACAINE-EPINEPHRINE 0.5% -1:200000 IJ SOLN: 10.0000 mL | Freq: Once | INTRAMUSCULAR | Status: DC

## 2019-05-25 MED ORDER — KETOROLAC TROMETHAMINE 60 MG/2ML IM SOLN
30.0000 mg | Freq: Once | INTRAMUSCULAR | Status: AC
Start: 2019-05-25 — End: 2019-05-25
  Administered 2019-05-25: 02:00:00 30 mg via INTRAMUSCULAR
  Filled 2019-05-25: qty 2

## 2019-05-25 MED ORDER — DIPHENHYDRAMINE HCL 50 MG/ML IJ SOLN
25.0000 mg | Freq: Once | INTRAMUSCULAR | Status: AC
  Administered 2019-05-25: 25 mg via INTRAMUSCULAR
  Filled 2019-05-25: qty 1

## 2019-05-25 MED ORDER — BUPIVACAINE-EPINEPHRINE (PF) 0.5% -1:200000 IJ SOLN
30.0000 mL | Freq: Once | INTRAMUSCULAR | Status: AC
  Administered 2019-05-25: 03:00:00 30 mL
  Filled 2019-05-25: qty 30

## 2019-05-25 MED ORDER — OXYCODONE-ACETAMINOPHEN 5-325 MG PO TABS: 1.0000 | ORAL_TABLET | Freq: Three times a day (TID) | ORAL | 0 refills | Status: DC | PRN

## 2019-05-25 MED ORDER — DIPHENHYDRAMINE HCL 50 MG/ML IJ SOLN: 25.0000 mg | Freq: Once | INTRAMUSCULAR | Status: DC

## 2019-05-25 MED ORDER — ONDANSETRON 4 MG PO TBDP
4.0000 mg | ORAL_TABLET | Freq: Once | ORAL | Status: AC
  Administered 2019-05-25: 03:00:00 4 mg via ORAL
  Filled 2019-05-25: qty 1

## 2019-05-25 NOTE — Discharge Instructions (Addendum)
Thank you for allowing me to care for you today in the Emergency Department.  Clean the area at least once daily with warm water and soap (shampoo). Apply a topical antibiotics, such as bacitracin or Neosporin.  Keep the area covered with a bandage until the incision site closes.  Make sure to change the bandage at least once daily.  Take 1 tablet of doxycycline by mouth 2 times daily for the next 5 days.  Make sure to finish the entire course.  Take 650 mg of Tylenol or 600 mg of ibuprofen with food every 6 hours for pain.  You can alternate between these 2 medications every 3 hours if your pain returns.  For instance, you can take Tylenol at noon, followed by a dose of ibuprofen at 3, followed by second dose of Tylenol and 6.  For severe, uncontrollable pain, you can take 1 tablet of Percocet by mouth every 8 hours. Each tablet of Percocet contains 325 mg of Tylenol.  Do not take more than 4000 mg of Tylenol in a 24-hour period.   I would recommend changing out your clippers or razor to ensure that it is not dull.  Follow-up with primary care if your symptoms persist for another week.  Return to the emergency department if you develop fevers, chills, significant changes to your hearing, drainage from your ear, fever, unable to move your neck, develop worsening redness, swelling, or thick, mucus-like drainage from the area, particularly after you have been on antibiotics for 48 hours, or other new, concerning symptoms.

## 2019-05-25 NOTE — ED Notes (Signed)
Ultrasound at bedside

## 2019-05-25 NOTE — ED Notes (Signed)
L&D tray and lidocaine at beside.
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
## 2019-05-25 NOTE — ED Notes (Signed)
Pt was verbalized discharge instructions. Pt had no further questions at this time. NAD. 

## 2019-08-18 ENCOUNTER — Other Ambulatory Visit: Payer: Self-pay

## 2019-08-18 DIAGNOSIS — Z20822 Contact with and (suspected) exposure to covid-19: Secondary | ICD-10-CM

## 2019-08-20 LAB — NOVEL CORONAVIRUS, NAA: SARS-CoV-2, NAA: NOT DETECTED

## 2020-03-06 ENCOUNTER — Encounter (HOSPITAL_COMMUNITY): Payer: Self-pay | Admitting: Licensed Clinical Social Worker

## 2020-03-06 ENCOUNTER — Other Ambulatory Visit: Payer: Self-pay

## 2020-03-06 ENCOUNTER — Ambulatory Visit (INDEPENDENT_AMBULATORY_CARE_PROVIDER_SITE_OTHER): Payer: No Payment, Other | Admitting: Licensed Clinical Social Worker

## 2020-03-06 DIAGNOSIS — F411 Generalized anxiety disorder: Secondary | ICD-10-CM
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
## 2020-03-18 ENCOUNTER — Telehealth (INDEPENDENT_AMBULATORY_CARE_PROVIDER_SITE_OTHER): Payer: No Payment, Other | Admitting: Psychiatry

## 2020-03-18 ENCOUNTER — Encounter (HOSPITAL_COMMUNITY): Payer: Self-pay | Admitting: Psychiatry

## 2020-03-18 ENCOUNTER — Other Ambulatory Visit: Payer: Self-pay

## 2020-03-18 DIAGNOSIS — F411 Generalized anxiety disorder: Secondary | ICD-10-CM

## 2020-03-18 MED ORDER — PROPRANOLOL HCL 20 MG PO TABS: 20.0000 mg | ORAL_TABLET | Freq: Three times a day (TID) | ORAL | 2 refills | Status: DC

## 2020-03-18 MED ORDER — HYDROXYZINE PAMOATE 50 MG PO CAPS: 50.0000 mg | ORAL_CAPSULE | Freq: Four times a day (QID) | ORAL | 3 refills | Status: DC | PRN

## 2020-03-18 MED ORDER — TRAZODONE HCL 100 MG PO TABS: 100.0000 mg | ORAL_TABLET | Freq: Every day | ORAL | 2 refills | Status: DC
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
## 2020-03-19 ENCOUNTER — Other Ambulatory Visit: Payer: Self-pay

## 2020-03-19 ENCOUNTER — Ambulatory Visit (HOSPITAL_COMMUNITY): Payer: Self-pay | Admitting: Licensed Clinical Social Worker

## 2020-03-19 DIAGNOSIS — F411 Generalized anxiety disorder: Secondary | ICD-10-CM

## 2020-04-02 ENCOUNTER — Other Ambulatory Visit: Payer: Self-pay

## 2020-04-02 ENCOUNTER — Ambulatory Visit (INDEPENDENT_AMBULATORY_CARE_PROVIDER_SITE_OTHER): Payer: No Payment, Other | Admitting: Licensed Clinical Social Worker

## 2020-04-02 DIAGNOSIS — F411 Generalized anxiety disorder: Secondary | ICD-10-CM
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
## 2020-04-25 ENCOUNTER — Encounter (HOSPITAL_COMMUNITY): Payer: Self-pay | Admitting: Licensed Clinical Social Worker

## 2020-04-25 ENCOUNTER — Other Ambulatory Visit: Payer: Self-pay

## 2020-04-25 ENCOUNTER — Ambulatory Visit (INDEPENDENT_AMBULATORY_CARE_PROVIDER_SITE_OTHER): Payer: No Payment, Other | Admitting: Licensed Clinical Social Worker

## 2020-04-25 DIAGNOSIS — F411 Generalized anxiety disorder: Secondary | ICD-10-CM | POA: Diagnosis not present
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
## 2020-05-01 ENCOUNTER — Other Ambulatory Visit: Payer: Self-pay

## 2020-05-01 ENCOUNTER — Ambulatory Visit (INDEPENDENT_AMBULATORY_CARE_PROVIDER_SITE_OTHER): Payer: No Payment, Other | Admitting: Licensed Clinical Social Worker

## 2020-05-01 DIAGNOSIS — F411 Generalized anxiety disorder: Secondary | ICD-10-CM | POA: Diagnosis not present

## 2020-05-01 NOTE — Progress Notes (Signed)
   THERAPIST PROGRESS NOTE  Session Time: 77  Participation Level: Active  Behavioral Response: CasualAlertAngry and Irritable  Type of Therapy: Individual Therapy  Treatment Goals addressed: Anxiety  Interventions: CBT and Supportive  Summary: [name] is a 44 y.o. male who presents with GAD.   Suicidal/Homicidal: NAwithout intent/plan  Therapist Response:   Subjective/Objective:  [name] was alert and oriented x 5. He presented to today's therapy session irritable and angry but was well engaged. 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    Pt started session with chief stressor of family. Currently pt youngest son is in the custody of his ex-girlfriend parents. [name] has been in contact with a legal team in order to get custody back after 5 years, primary reason pt lost custody of child was because of alcohol addiction. 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 Pt is concerned with the religion his son is being brought up in called "Holism" and as of today found a YouTube of the grandparents congregation talking poorly about pt current wife with pt son present, [name] has not seen the video but received a call from his wife after one of her colleagues found it. "This will only strength my case for custody" pt reports.   [name] will use coping skill of working out after session to let out frustration. He also has been talking with his father weekly as well and pt states that this has been an added support.    Assessment/Plan: Pt endorses symptoms for anxiety or tension, worry, and irritability. He has been taking medication on time and as prescribed. [name] continues to meet criteria for GAD. Plan moving forward Continue work outs at least 3 times weekly with increase as needed, take all medication as prescribed, and last step will be to start playing guitar at night. He will follow up with LCSW in two  weeks.    Plan: Return again in 2 week.  Diagnosis: Axis I: Generalized Anxiety Disorder   [name], LCSW 05/01/2020

## 2020-05-12 ENCOUNTER — Ambulatory Visit (INDEPENDENT_AMBULATORY_CARE_PROVIDER_SITE_OTHER): Payer: No Payment, Other | Admitting: Licensed Clinical Social Worker

## 2020-05-12 ENCOUNTER — Other Ambulatory Visit: Payer: Self-pay

## 2020-05-12 DIAGNOSIS — F411 Generalized anxiety disorder: Secondary | ICD-10-CM

## 2020-05-12 NOTE — Progress Notes (Signed)
   THERAPIST PROGRESS NOTE  Session Time: 88 Therapist Response:     Subjective/Objective: Pt was alert and oriented x 5. He was dressed casually and had good eye contact. [name] engaged well throughout assessment with anxious affect/mood. He reports primary stressor as son legal guardians' maternal grandparents. [name] has been in the process of trying to go through mediation with his lawyers to gain either full custody or set times written into custody agreement. Currently he can only get his 38-year-old son when grandparents grant permission and normally it is for less than 24 hours. 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 Support systems include family, spouse, and AA and pt also reports that he is joining a church in the next two to 3 weeks.  Pt has been upset over latest video found on the internet with his kid's maternal grandparents shaming his wife. Pt did use coping skills that he has used during session including working out and playing the guitar to help process through his anger and anxiety. [name] states these sessions have been really helping. Other stressors also are relationship based, pt does have a good relationship with his spouse. However, pt spouse has PCOS which has cause her to become self-conscious about her body. [name] does reports he give her positive feedback and compliments, but their intimacy has been lacking for over a year. Pt primary goal is to obtain a better custody agreement of son 2nd goal is to gain intimacy in his relationship.     Assessment/plan:  Pt endorses symptoms for anxiety for irritability, tension, and worry. [name] does meet criteria for GAD. He has been taking medications as prescribed, he has had no reported adverse effects from medications currently taken. Plan for pt will be to continue workouts 3 times weekly, attend 1 church service per week, & attempt to provide spouse with  feedback about relationship. This will be completed by asking spouse to write 1 to 3 things to provide pt for feedback and pt will provide the same for spouse then present it to each other.   Participation Level: Active  Behavioral Response: CasualAlertAngry and Irritable  Type of Therapy: Individual Therapy  Treatment Goals addressed: Anxiety  Interventions: CBT and Supportive  Summary: [name] is a 44 y.o. male who presents with GAD.   Suicidal/Homicidal: NAwithout intent/plan  Plan: Return again in 05/26/2020.   Diagnosis: Axis I: Generalized Anxiety Disorder    Axis II: No diagnosis    [name], LCSW 05/12/2020

## 2020-05-26 ENCOUNTER — Ambulatory Visit (INDEPENDENT_AMBULATORY_CARE_PROVIDER_SITE_OTHER): Payer: No Payment, Other | Admitting: Licensed Clinical Social Worker

## 2020-05-26 ENCOUNTER — Other Ambulatory Visit: Payer: Self-pay

## 2020-05-26 DIAGNOSIS — F411 Generalized anxiety disorder: Secondary | ICD-10-CM | POA: Diagnosis not present

## 2020-05-26 NOTE — Progress Notes (Signed)
   THERAPIST PROGRESS NOTE  Session Time: 86  Therapist Response:    Subjective/objective: Pt was alert and oriented x 5. He was dressed casually with good eye contact the LCSW. Pt had euthymic mood/affect. Pt reports that family conflict and relationship have been his primary forms of stress. He states that he is still trying to get custody back of his son from sons grandparents. Currently pt is in the mediating process and is awaiting the grandparents of the son to do the same. There is a court date for the end of September for an emergency injunction to help gain more custody time with his son. Pt reports that this has been stressful, but he knows he is moving in the right direction.   [name] states that he has had some intimacy issues with his spouse. This has caused pt stress. He has been alleviating this stress by sublimating sexual relations with his partner with porn. Pt reports that this is not what he wants to do, but because of his spouse's lack of sexual intention in the relationship this is what he has been doing.    Assessment/plan: Pt endorses symptoms for stress, tension, and worry. Pt reports he has been taking all medications on time and as directed. He currently meets criteria for GAD. Plan moving forward workout 3 times weekly. Pt will also setup a time to sit down with his spouse and explain the need for sexual relation with his partner. Pt to follow up with LCSW in 2 weeks.    Participation Level: Active  Behavioral Response: CasualAlertEuthymic  Type of Therapy: Individual Therapy  Treatment Goals addressed: Anxiety  Interventions: CBT and Supportive  Summary: [name] is a 44 y.o. male who presents with GAD.   Suicidal/Homicidal: Nowithout intent/plan   Plan: Return again in 2 weeks.      [name], LCSW 05/26/2020

## 2020-06-09 ENCOUNTER — Other Ambulatory Visit: Payer: Self-pay

## 2020-06-09 ENCOUNTER — Ambulatory Visit (HOSPITAL_COMMUNITY): Payer: No Payment, Other | Admitting: Licensed Clinical Social Worker

## 2020-06-18 ENCOUNTER — Encounter (HOSPITAL_COMMUNITY): Payer: Self-pay | Admitting: Psychiatry

## 2020-06-18 ENCOUNTER — Other Ambulatory Visit: Payer: Self-pay

## 2020-06-18 ENCOUNTER — Telehealth (INDEPENDENT_AMBULATORY_CARE_PROVIDER_SITE_OTHER): Payer: No Payment, Other | Admitting: Psychiatry

## 2020-06-18 DIAGNOSIS — F411 Generalized anxiety disorder: Secondary | ICD-10-CM | POA: Diagnosis not present

## 2020-06-18 MED ORDER — HYDROXYZINE PAMOATE 50 MG PO CAPS: 50.0000 mg | ORAL_CAPSULE | Freq: Four times a day (QID) | ORAL | 2 refills | Status: DC | PRN

## 2020-06-18 MED ORDER — TRAZODONE HCL 100 MG PO TABS: 100.0000 mg | ORAL_TABLET | Freq: Every day | ORAL | 2 refills | Status: DC

## 2020-06-18 MED ORDER — PROPRANOLOL HCL 20 MG PO TABS: 20.0000 mg | ORAL_TABLET | Freq: Three times a day (TID) | ORAL | 2 refills | Status: DC
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
## 2020-06-26 ENCOUNTER — Ambulatory Visit (INDEPENDENT_AMBULATORY_CARE_PROVIDER_SITE_OTHER): Payer: No Payment, Other | Admitting: Licensed Clinical Social Worker

## 2020-06-26 ENCOUNTER — Other Ambulatory Visit: Payer: Self-pay

## 2020-06-26 DIAGNOSIS — F411 Generalized anxiety disorder: Secondary | ICD-10-CM | POA: Diagnosis not present

## 2020-06-26 NOTE — Progress Notes (Signed)
° °  THERAPIST PROGRESS NOTE  Virtual Visit via Video Note  I connected with [name] on 06/26/20 at  3:00 PM EDT by a video enabled telemedicine application and verified that I am speaking with the correct person using two identifiers.  Location: Patient: [facility]  Provider: [facility]   I discussed the limitations of evaluation and management by telemedicine and the availability of in person appointments. The patient expressed understanding and agreed to proceed.   Therapist Response:   Subjective/Objective: Pt was alert and oriented x 5. He was dressed casually and was fairly groomed. He presented today with euthymic mood/affect.   Pt reports feeling "great", he states he is excited because he got to do mediation with the current guardian of his son. Mediation was not able to get the two parties to agree. [name] stated he expected this outcome. They got a court date for 9/29 and a continuation was dated for Oct 14th. [name] reports in mediation that he was able to tell the guardian how he felt about the raising of his son. This was a huge relief and the lawyer had told him that he did it in a way that was not departmental to custody case.   [name] reports that he has finally been able to have some intimacy with his spouse. This has not occurred in the past two years. He states, "I do not know what changed but it felt great". He reports that he has not been able to conduct his other coping skills such as working out and playing the guitar but will now make that a priority.    Assessment/Plan:  Pt endorses symptoms for anxiety as worry, tension, panic attacks, and irritability. He does meet criteria for GAD. Pt has been taking all medications as prescribed. Plan moving forward will be to start play guitar 2 x weekly and workout 3 x weekly. [name] was agreeable to that plan. Pt will be seen in 3 weeks.     I discussed the assessment and treatment plan with the patient. The  patient was provided an opportunity to ask questions and all were answered. The patient agreed with the plan and demonstrated an understanding of the instructions.   The patient was advised to call back or seek an in-person evaluation if the symptoms worsen or if the condition fails to improve as anticipated.  I provided 45 minutes of non-face-to-face time during this encounter.   [name], LCSW  Participation Level: Active  Behavioral Response: CasualAlertEuthymic  Type of Therapy: Individual Therapy  Treatment Goals addressed: Anxiety  Interventions: CBT and Supportive  Summary: [name] is a 44 y.o. male who presents with GAD .   Suicidal/Homicidal: Nowithout intent/plan  Plan: Return again in 3 weeks.      [name], LCSW 06/26/2020

## 2020-07-10 ENCOUNTER — Ambulatory Visit (HOSPITAL_COMMUNITY): Payer: Self-pay | Admitting: Licensed Clinical Social Worker

## 2020-07-16 ENCOUNTER — Ambulatory Visit (HOSPITAL_COMMUNITY): Payer: No Payment, Other | Admitting: Licensed Clinical Social Worker

## 2020-07-24 ENCOUNTER — Ambulatory Visit (HOSPITAL_COMMUNITY): Payer: Self-pay | Admitting: Licensed Clinical Social Worker

## 2020-07-31 ENCOUNTER — Other Ambulatory Visit: Payer: Self-pay

## 2020-07-31 ENCOUNTER — Ambulatory Visit (INDEPENDENT_AMBULATORY_CARE_PROVIDER_SITE_OTHER): Payer: No Payment, Other | Admitting: Licensed Clinical Social Worker

## 2020-07-31 DIAGNOSIS — F411 Generalized anxiety disorder: Secondary | ICD-10-CM

## 2020-07-31 NOTE — Progress Notes (Signed)
   THERAPIST PROGRESS NOTE  Session Time: 22  Therapist Response:     Subjective/Objective: Pt was alert and oriented x 5. 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 Rochell presented today with anxious mood/affect. He was cooperative and maintained good eye contact.   Primary stressor for pt is legal. Guadalupe is currently attempting to gain custody of his youngest child. His son is currently in the custody of his ex-girlfriend parents or the child's grandparents. [place] had to go through mediation before going through the court process. Per [name] the grandparents of the child did not want to negotiate a new custody agreement, which currently only gives [place] supervised visits. Pt has a court hearing on 11/5 in [place]. He reports anxiety and tension. LCSW used solution focused therapy to attempt anxiety exercise to decrease his tension and worry. LCSW and pt discussed working out tonight as he needs to  be in court in the early morning. Lenoard was agreeable to this plan and stated that he would attempt to do this tonight.   Other than his custody case, pt was also having intimacy problems with his wife. Currently they are going through a program called "[employer]" which helps strengthen bonds in the relationship. Nickalous reports that intimacy, communication, and overall relationship have increased.   Assessment/Plan: Pt endorse symptoms for anxiety, tension, and worry. Currently he is taking all his medications as prescribed. Edson does meet criteria for GAD. Plan moving forward continue to exercise 3 x weekly and continue to take medication as prescribed. Pt reports relief stating he was at 7/10 for anxiety and decreaseed to a 4/10 after session.      Participation Level: Active  Behavioral Response: Casual and DisheveledAlertAnxious  Type of Therapy: Individual Therapy  Treatment Goals addressed: Anxiety  Interventions: Solution Focused and  Supportive  Summary: DAMARIS GEERS is a 44 y.o. male who presents with GAD.   Suicidal/Homicidal: NAwithout intent/plan  Plan: Return again in 8 weeks.     [name], LCSW 07/31/2020

## 2020-09-17 ENCOUNTER — Telehealth (HOSPITAL_COMMUNITY): Payer: No Payment, Other | Admitting: Psychiatry

## 2020-09-23 ENCOUNTER — Telehealth (HOSPITAL_COMMUNITY): Payer: Self-pay | Admitting: *Deleted

## 2020-09-23 NOTE — Telephone Encounter (Signed)
Thank you :)

## 2020-09-23 NOTE — Telephone Encounter (Signed)
FAXED REFILL REQUEST hydrOXYzine (VISTARIL) 50 MG capsule

## 2020-09-24 ENCOUNTER — Other Ambulatory Visit (HOSPITAL_COMMUNITY): Payer: Self-pay | Admitting: Psychiatry

## 2020-09-24 DIAGNOSIS — F411 Generalized anxiety disorder: Secondary | ICD-10-CM

## 2020-10-01 ENCOUNTER — Ambulatory Visit (HOSPITAL_COMMUNITY): Payer: No Payment, Other | Admitting: Licensed Clinical Social Worker

## 2020-10-15 ENCOUNTER — Ambulatory Visit (HOSPITAL_COMMUNITY): Payer: No Payment, Other | Admitting: Licensed Clinical Social Worker

## 2020-10-15 ENCOUNTER — Other Ambulatory Visit: Payer: Self-pay

## 2020-10-15 ENCOUNTER — Telehealth (INDEPENDENT_AMBULATORY_CARE_PROVIDER_SITE_OTHER): Payer: No Payment, Other | Admitting: Psychiatry

## 2020-10-15 ENCOUNTER — Encounter (HOSPITAL_COMMUNITY): Payer: Self-pay | Admitting: Psychiatry

## 2020-10-15 DIAGNOSIS — F411 Generalized anxiety disorder: Secondary | ICD-10-CM | POA: Diagnosis not present

## 2020-10-15 MED ORDER — PROPRANOLOL HCL 20 MG PO TABS: 20.0000 mg | ORAL_TABLET | Freq: Three times a day (TID) | ORAL | 2 refills | Status: DC

## 2020-10-15 MED ORDER — TRAZODONE HCL 100 MG PO TABS: 100.0000 mg | ORAL_TABLET | Freq: Every day | ORAL | 2 refills | Status: DC

## 2020-10-15 MED ORDER — HYDROXYZINE PAMOATE 50 MG PO CAPS: ORAL_CAPSULE | ORAL | 2 refills | Status: DC
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
## 2020-12-03 ENCOUNTER — Emergency Department (HOSPITAL_COMMUNITY)
Admission: EM | Admit: 2020-12-03 | Discharge: 2020-12-03 | Disposition: A | Discharge status: Home / Self Care | Payer: Self-pay | Attending: Emergency Medicine | Admitting: Emergency Medicine

## 2020-12-03 ENCOUNTER — Other Ambulatory Visit: Payer: Self-pay

## 2020-12-03 ENCOUNTER — Encounter (HOSPITAL_COMMUNITY): Payer: Self-pay | Admitting: Emergency Medicine

## 2020-12-03 DIAGNOSIS — Z87891 Personal history of nicotine dependence: Secondary | ICD-10-CM | POA: Insufficient documentation

## 2020-12-03 DIAGNOSIS — M545 Low back pain, unspecified: Secondary | ICD-10-CM | POA: Insufficient documentation

## 2020-12-03 DIAGNOSIS — M5416 Radiculopathy, lumbar region: Secondary | ICD-10-CM

## 2020-12-03 LAB — URINALYSIS, ROUTINE W REFLEX MICROSCOPIC
Bilirubin Urine: NEGATIVE
Glucose, UA: NEGATIVE mg/dL
Hgb urine dipstick: NEGATIVE
Ketones, ur: NEGATIVE mg/dL
Leukocytes,Ua: NEGATIVE
Nitrite: NEGATIVE
Protein, ur: NEGATIVE mg/dL
Specific Gravity, Urine: 1.023 (ref 1.005–1.030)
pH: 6 (ref 5.0–8.0)

## 2020-12-03 MED ORDER — DIAZEPAM 5 MG PO TABS
5.0000 mg | ORAL_TABLET | Freq: Once | ORAL | Status: AC
  Administered 2020-12-03: 5 mg via ORAL
  Filled 2020-12-03: qty 1

## 2020-12-03 MED ORDER — OXYCODONE-ACETAMINOPHEN 5-325 MG PO TABS
1.0000 | ORAL_TABLET | ORAL | Status: DC | PRN
  Administered 2020-12-03: 1 via ORAL
  Filled 2020-12-03: qty 1

## 2020-12-03 MED ORDER — ACETAMINOPHEN 325 MG PO TABS
650.0000 mg | ORAL_TABLET | Freq: Once | ORAL | Status: AC
  Administered 2020-12-03: 650 mg via ORAL
  Filled 2020-12-03: qty 2

## 2020-12-03 MED ORDER — KETOROLAC TROMETHAMINE 60 MG/2ML IM SOLN
15.0000 mg | Freq: Once | INTRAMUSCULAR | Status: AC
  Administered 2020-12-03: 15 mg via INTRAMUSCULAR
  Filled 2020-12-03: qty 2

## 2020-12-03 MED ORDER — OXYCODONE HCL 5 MG PO TABS
5.0000 mg | ORAL_TABLET | Freq: Once | ORAL | Status: AC
  Administered 2020-12-03: 5 mg via ORAL
  Filled 2020-12-03: qty 1

## 2020-12-03 NOTE — Discharge Instructions (Signed)
Your back pain is most likely due to a muscular strain.  There is been a lot of research on back pain, unfortunately the only thing that seems to really help is Tylenol and ibuprofen.  Relative rest is also important to not lift greater than 10 pounds bending or twisting at the waist.  Please follow-up with your family physician.  The other thing that really seems to benefit patients is physical therapy which your doctor may send you for.  Please return to the emergency department for new numbness or weakness to your arms or legs. Difficulty with urinating or urinating or pooping on yourself.  Also if you cannot feel toilet paper when you wipe or get a fever.   Take 4 over the counter ibuprofen tablets 3 times a day or 2 over-the-counter naproxen tablets twice a day for pain. Also take tylenol 1000mg (2 extra strength) four times a day.   I have given you information for one of the sports med docs.  You may be able to get into the office with them.

## 2020-12-03 NOTE — ED Triage Notes (Signed)
Onset 2/28 moving furniture at work and developed pain right lower back. States pain worsening overtime seen at Urgent Care. States pain not getting any better and radiating to right hip and right lower extremity. Denies any bowel or urinary incontinence. States urine color today was orange in color.
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
## 2020-12-03 NOTE — Progress Notes (Signed)
CSW met with [name] and spouse at bedside. [name] states that he bent over to pick something up and felt a "pop" and has been experiencing pain since that time. [name] does not have PCP nor insurance. CSW provided resources for [facility] for follow up appointment.

## 2020-12-04 ENCOUNTER — Ambulatory Visit (INDEPENDENT_AMBULATORY_CARE_PROVIDER_SITE_OTHER): Payer: Self-pay | Admitting: Family Medicine

## 2020-12-04 ENCOUNTER — Encounter (HOSPITAL_BASED_OUTPATIENT_CLINIC_OR_DEPARTMENT_OTHER): Payer: Self-pay | Admitting: Family Medicine

## 2020-12-04 VITALS — BP 142/86 | HR 81 | Ht 75.0 in | Wt 233.0 lb

## 2020-12-04 DIAGNOSIS — G8929 Other chronic pain: Secondary | ICD-10-CM | POA: Insufficient documentation

## 2020-12-04 DIAGNOSIS — M545 Low back pain, unspecified: Secondary | ICD-10-CM | POA: Insufficient documentation

## 2020-12-04 MED ORDER — CYCLOBENZAPRINE HCL 10 MG PO TABS: ORAL_TABLET | ORAL | 0 refills | Status: DC

## 2020-12-04 MED ORDER — MELOXICAM 15 MG PO TABS: ORAL_TABLET | ORAL | 3 refills | Status: DC

## 2020-12-04 NOTE — Assessment & Plan Note (Signed)
New problem, not at goal Discussed etiology and expected prognosis of current issue At this time, do not suspect neurologic involvement given patient's history and physical exam Recommend treating with meloxicam as prescribed, cautioned on avoiding other NSAIDs while taking meloxicam, be sure to take meloxicam with food We will also provide prescription for cyclobenzaprine which patient reportedly tolerated previously.  Cautioned on sedating side effects of muscle relaxer, avoid driving or operating heavy machinery while using medication Continue with conservative measures including ice or heat topically, home exercises.  Provided handout with home exercises -recommended referral to physical therapy, however lack of health insurance makes it difficult for patient We will order x-rays of lumbar spine and plan for follow-up in about 4 weeks or sooner as needed

## 2020-12-04 NOTE — Patient Instructions (Addendum)
Medication Instructions:  Your physician has recommended you make the following change in your medication:  -- Meloxicam 15 mg - Take 1 tablet by mouth every morning with a meal for 2 weeks, then take 1 tablet by mouth daily as needed for pain -- Flexeril 10 mg - Take 0.5 to 1 tablet by mouth every night, then increase gradually to 1 tablet by mouth 3 times daily as needed for pain  *If you need a refill on any your medications before your next appointment, please call your pharmacy first. If no refills are authorized on file call the office.*  Procedures: Your physician recommends that you have an X-ray of your Lumbar Spine.  X-rays use invisible electromagnetic energy beams to produce images of internal tissues, bones, and organs on film or digital media. Standard X-rays are performed for many reasons, including diagnosing tumors or bone injuries.   You may have these images done at the [facility] [address] on the ground floor, [phone]. They are a walk-in imaging facility. The hours of operation are:   Sunday:Closed Monday:7:30 AM - 5:00 PM Tuesday:7:30 AM - 5:00 PM Wednesday:7:30 AM - 5:00 PM Thursday:7:30 AM - 5:00 PM Friday:7:30 AM - 5:00 PM Saturday:Closed   Lab Work: None Ordered If you have labs (blood work) drawn today and your tests are completely normal, you will receive your results only by: Marland Kitchen. MyChart Message (if you have MyChart) OR . A phone call from our staff. Please ensure you check your voicemail in the event that you authorized detailed messages to be left on a delegated number. If you have any lab test that is abnormal or we need to change your treatment, we will call you to review the results.  Follow-Up: Your next appointment:   Your physician recommends that you schedule a follow-up appointment in: 4-6 WEEKS with Dr. De Peruuba  We recommend signing up for the patient portal called "MyChart".  Sign up information  is provided on this After Visit Summary.  MyChart is used to connect with patients for Virtual Visits (Telemedicine).  Patients are able to view lab/test results, encounter notes, upcoming appointments, etc.  Non-urgent messages can be sent to your provider as well.   To learn more about what you can do with MyChart, go to [email or web address].                Low Back Sprain or Strain Rehab Ask your health care provider which exercises are safe for you. Do exercises exactly as told by your health care provider and adjust them as directed. It is normal to feel mild stretching, pulling, tightness, or discomfort as you do these exercises. Stop right away if you feel sudden pain or your pain gets worse. Do not begin these exercises until told by your health care provider. Stretching and range-of-motion exercises These exercises warm up your muscles and joints and improve the movement and flexibility of your back. These exercises also help to relieve pain, numbness, and tingling. Lumbar rotation 1. Lie on your back on a firm surface and bend your knees. 2. Straighten your arms out to your sides so each arm forms a 90-degree angle (right angle) with a side of your body. 3. Slowly move (rotate) both of your knees to one side of your body until you feel a stretch in your lower back (lumbar). Try not to let your shoulders lift off the floor. 4. Hold this position for __________ seconds. 5. Tense your  abdominal muscles and slowly move your knees back to the starting position. 6. Repeat this exercise on the other side of your body. Repeat __________ times. Complete this exercise __________ times a day.   Single knee to chest 1. Lie on your back on a firm surface with both legs straight. 2. Bend one of your knees. Use your hands to move your knee up toward your chest until you feel a gentle stretch in your lower back and buttock. ? Hold your leg in this position by holding on to the front of your  knee. ? Keep your other leg as straight as possible. 3. Hold this position for __________ seconds. 4. Slowly return to the starting position. 5. Repeat with your other leg. Repeat __________ times. Complete this exercise __________ times a day.   Prone extension on elbows 1. Lie on your abdomen on a firm surface (prone position). 2. Prop yourself up on your elbows. 3. Use your arms to help lift your chest up until you feel a gentle stretch in your abdomen and your lower back. ? This will place some of your body weight on your elbows. If this is uncomfortable, try stacking pillows under your chest. ? Your hips should stay down, against the surface that you are lying on. Keep your hip and back muscles relaxed. 4. Hold this position for __________ seconds. 5. Slowly relax your upper body and return to the starting position. Repeat __________ times. Complete this exercise __________ times a day.   Strengthening exercises These exercises build strength and endurance in your back. Endurance is the ability to use your muscles for a long time, even after they get tired. Pelvic tilt This exercise strengthens the muscles that lie deep in the abdomen. 1. Lie on your back on a firm surface. Bend your knees and keep your feet flat on the floor. 2. Tense your abdominal muscles. Tip your pelvis up toward the ceiling and flatten your lower back into the floor. ? To help with this exercise, you may place a small towel under your lower back and try to push your back into the towel. 3. Hold this position for __________ seconds. 4. Let your muscles relax completely before you repeat this exercise. Repeat __________ times. Complete this exercise __________ times a day. Alternating arm and leg raises 1. Get on your hands and knees on a firm surface. If you are on a hard floor, you may want to use padding, such as an exercise mat, to cushion your knees. 2. Line up your arms and legs. Your hands should be  directly below your shoulders, and your knees should be directly below your hips. 3. Lift your left leg behind you. At the same time, raise your right arm and straighten it in front of you. ? Do not lift your leg higher than your hip. ? Do not lift your arm higher than your shoulder. ? Keep your abdominal and back muscles tight. ? Keep your hips facing the ground. ? Do not arch your back. ? Keep your balance carefully, and do not hold your breath. 4. Hold this position for __________ seconds. 5. Slowly return to the starting position. 6. Repeat with your right leg and your left arm. Repeat __________ times. Complete this exercise __________ times a day.   Abdominal set with straight leg raise 1. Lie on your back on a firm surface. 2. Bend one of your knees and keep your other leg straight. 3. Tense your abdominal muscles and lift your  straight leg up, 4-6 inches (10-15 cm) off the ground. 4. Keep your abdominal muscles tight and hold this position for __________ seconds. ? Do not hold your breath. ? Do not arch your back. Keep it flat against the ground. 5. Keep your abdominal muscles tense as you slowly lower your leg back to the starting position. 6. Repeat with your other leg. Repeat __________ times. Complete this exercise __________ times a day.   Single leg lower with bent knees 1. Lie on your back on a firm surface. 2. Tense your abdominal muscles and lift your feet off the floor, one foot at a time, so your knees and hips are bent in 90-degree angles (right angles). ? Your knees should be over your hips and your lower legs should be parallel to the floor. 3. Keeping your abdominal muscles tense and your knee bent, slowly lower one of your legs so your toe touches the ground. 4. Lift your leg back up to return to the starting position. ? Do not hold your breath. ? Do not let your back arch. Keep your back flat against the ground. 5. Repeat with your other leg. Repeat __________  times. Complete this exercise __________ times a day. Posture and body mechanics Good posture and healthy body mechanics can help to relieve stress in your body's tissues and joints. Body mechanics refers to the movements and positions of your body while you do your daily activities. Posture is part of body mechanics. Good posture means:  Your spine is in its natural S-curve position (neutral).  Your shoulders are pulled back slightly.  Your head is not tipped forward. Follow these guidelines to improve your posture and body mechanics in your everyday activities. Standing  When standing, keep your spine neutral and your feet about hip width apart. Keep a slight bend in your knees. Your ears, shoulders, and hips should line up.  When you do a task in which you stand in one place for a long time, place one foot up on a stable object that is 2-4 inches (5-10 cm) high, such as a footstool. This helps keep your spine neutral.   Sitting  When sitting, keep your spine neutral and keep your feet flat on the floor. Use a footrest, if necessary, and keep your thighs parallel to the floor. Avoid rounding your shoulders, and avoid tilting your head forward.  When working at a desk or a computer, keep your desk at a height where your hands are slightly lower than your elbows. Slide your chair under your desk so you are close enough to maintain good posture.  When working at a computer, place your monitor at a height where you are looking straight ahead and you do not have to tilt your head forward or downward to look at the screen.   Resting  When lying down and resting, avoid positions that are most painful for you.  If you have pain with activities such as sitting, bending, stooping, or squatting, lie in a position in which your body does not bend very much. For example, avoid curling up on your side with your arms and knees near your chest (fetal position).  If you have pain with activities such as  standing for a long time or reaching with your arms, lie with your spine in a neutral position and bend your knees slightly. Try the following positions: ? Lying on your side with a pillow between your knees. ? Lying on your back with a pillow under  your knees. Lifting  When lifting objects, keep your feet at least shoulder width apart and tighten your abdominal muscles.  Bend your knees and hips and keep your spine neutral. It is important to lift using the strength of your legs, not your back. Do not lock your knees straight out.  Always ask for help to lift heavy or awkward objects.   This information is not intended to replace advice given to you by your health care provider. Make sure you discuss any questions you have with your health care provider. Document Revised: 01/05/2019 Document Reviewed: 10/05/2018 Elsevier Patient Education  2021 [employer].
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
## 2020-12-08 ENCOUNTER — Ambulatory Visit (INDEPENDENT_AMBULATORY_CARE_PROVIDER_SITE_OTHER): Payer: No Payment, Other | Admitting: Licensed Clinical Social Worker

## 2020-12-08 ENCOUNTER — Other Ambulatory Visit: Payer: Self-pay

## 2020-12-08 DIAGNOSIS — F411 Generalized anxiety disorder: Secondary | ICD-10-CM

## 2020-12-08 NOTE — Progress Notes (Signed)
   THERAPIST PROGRESS NOTE  Session Time: 67  Virtual Visit via Video Note  I connected with [name] on 12/08/20 at  1:00 PM EDT by a video enabled telemedicine application and verified that I am speaking with the correct person using two identifiers.  Location: Patient: [facility] Provider: [facility]    I discussed the limitations of evaluation and management by telemedicine and the availability of in person appointments. The patient expressed understanding and agreed to proceed.  Therapist Response:    Subjective/Objective: Pt was alert and oriented x 5. He was dressed casually and fairly groomed. Pt presented today with euthymic mod/affect. He was cooperative and maintained good eye contact.    Primary stressor for pt is illness and family conflict. [name] is still working with court system about a restructured custody agreement with for his son. Primary custody is with child's maternal grandparent. Pt has been maintaining sobriety, has steady employment, and is in a stable marriage.   Pt other stressors also include illness. Pt recently hurt his back and has been attempting to rehab his back so that he can return to work without pain. [name] has been working with a sports medicine doctor for treatment.     Assessment/Plan: Pt endorse symptoms for anxiety as tension, worry, and minor irritability. He reports that he is taking his medications as directed. Plan moving forward will be to maintain workout 3 x weekly, play guitar 2 x weekly, and walk as needed to help decrease his anxiety      I discussed the assessment and treatment plan with the patient. The patient was provided an opportunity to ask questions and all were answered. The patient agreed with the plan and demonstrated an understanding of the instructions.   The patient was advised to call back or seek an in-person evaluation if the symptoms worsen or if the condition fails to improve as anticipated.  I  provided 45 minutes of non-face-to-face time during this encounter.   [name], LCSW   Participation Level: Active  Behavioral Response: Casual, Fairly Groomed and NeatAlertAnxious  Type of Therapy: Individual Therapy  Treatment Goals addressed: Diagnosis: GAD  Interventions: CBT and Supportive  Summary: [name] is a 45 y.o. male who presents with GAD .   Suicidal/Homicidal: NAwithout intent/plan   Plan: Return again in 2 weeks.     [name], LCSW 12/08/2020

## 2021-01-01 ENCOUNTER — Other Ambulatory Visit: Payer: Self-pay

## 2021-01-01 ENCOUNTER — Ambulatory Visit (INDEPENDENT_AMBULATORY_CARE_PROVIDER_SITE_OTHER): Payer: No Payment, Other | Admitting: Licensed Clinical Social Worker

## 2021-01-01 DIAGNOSIS — F411 Generalized anxiety disorder: Secondary | ICD-10-CM | POA: Diagnosis not present

## 2021-01-01 NOTE — Progress Notes (Signed)
   THERAPIST PROGRESS NOTE  Session Time: 35   Therapist Response:    Subjective/Objective:  Pt was alert and oriented x 5. He was dressed casually and engaged well in therapy session. Pt presented today with anxious mood/affect. He was cooperative and maintained good eye contact.   Primary stressor today was legal. In 6 days pt is to have a custody hearing with the legal guardians of his youngest son. [name] has been attempting to get primary custody of his child for the last 12 months. He has attended mediations which was unsuccessful with the legal guardian of his son. He reports tension, worry, irritability, and restlessness for anxiety symptoms. [name] primary coping strategies are his guitar and working out. Pt is working out 3 x per week and play guitar 2 times per weeks. [name] is agreeable to continue utilizing these coping skills as he reports it decreases his tension and worry.    Assessment/Plan: Pt endorses symptoms listed above. He does meet criteria for GAD. He reports that he is taking his medications as prescribed. He will continue to f/u with LCSW 1 x monthly moving forward   Participation Level: Active  Behavioral Response: Casual and Fairly GroomedAlertAnxious and Depressed  Type of Therapy: Individual Therapy  Treatment Goals addressed: Diagnosis: GAD   Interventions: CBT and Supportive  Summary: [name] is a 45 y.o. male who presents with GAD.   Suicidal/Homicidal: NAwithout intent/plan    Plan: Return again in 45 weeks.     [name], LCSW 01/01/2021

## 2021-01-13 ENCOUNTER — Other Ambulatory Visit: Payer: Self-pay

## 2021-01-13 ENCOUNTER — Encounter (HOSPITAL_COMMUNITY): Payer: Self-pay | Admitting: Psychiatry

## 2021-01-13 ENCOUNTER — Telehealth (INDEPENDENT_AMBULATORY_CARE_PROVIDER_SITE_OTHER): Payer: No Payment, Other | Admitting: Psychiatry

## 2021-01-13 DIAGNOSIS — F411 Generalized anxiety disorder: Secondary | ICD-10-CM | POA: Diagnosis not present

## 2021-01-13 MED ORDER — HYDROXYZINE PAMOATE 50 MG PO CAPS: ORAL_CAPSULE | ORAL | 2 refills | Status: DC

## 2021-01-13 MED ORDER — TRAZODONE HCL 100 MG PO TABS: 100.0000 mg | ORAL_TABLET | Freq: Every day | ORAL | 2 refills | Status: DC

## 2021-01-13 MED ORDER — PROPRANOLOL HCL 20 MG PO TABS: 20.0000 mg | ORAL_TABLET | Freq: Three times a day (TID) | ORAL | 2 refills | Status: DC
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
## 2021-01-14 ENCOUNTER — Other Ambulatory Visit (HOSPITAL_COMMUNITY): Payer: Self-pay | Admitting: Psychiatry

## 2021-01-14 DIAGNOSIS — F411 Generalized anxiety disorder: Secondary | ICD-10-CM

## 2021-01-15 ENCOUNTER — Ambulatory Visit (HOSPITAL_BASED_OUTPATIENT_CLINIC_OR_DEPARTMENT_OTHER): Payer: Self-pay | Admitting: Family Medicine

## 2021-01-16 ENCOUNTER — Ambulatory Visit (HOSPITAL_COMMUNITY): Payer: No Payment, Other | Admitting: Licensed Clinical Social Worker

## 2021-01-28 ENCOUNTER — Ambulatory Visit (HOSPITAL_BASED_OUTPATIENT_CLINIC_OR_DEPARTMENT_OTHER): Payer: Self-pay | Admitting: Family Medicine

## 2021-02-11 ENCOUNTER — Ambulatory Visit (HOSPITAL_COMMUNITY): Payer: No Payment, Other | Admitting: Licensed Clinical Social Worker

## 2021-02-18 ENCOUNTER — Other Ambulatory Visit (HOSPITAL_COMMUNITY): Payer: Self-pay | Admitting: Psychiatry

## 2021-02-18 DIAGNOSIS — F411 Generalized anxiety disorder: Secondary | ICD-10-CM

## 2021-03-17 ENCOUNTER — Other Ambulatory Visit (HOSPITAL_COMMUNITY): Payer: Self-pay | Admitting: Psychiatry

## 2021-03-17 DIAGNOSIS — F411 Generalized anxiety disorder: Secondary | ICD-10-CM

## 2021-04-14 ENCOUNTER — Telehealth (INDEPENDENT_AMBULATORY_CARE_PROVIDER_SITE_OTHER): Payer: No Payment, Other | Admitting: Psychiatry

## 2021-04-14 ENCOUNTER — Other Ambulatory Visit: Payer: Self-pay

## 2021-04-14 ENCOUNTER — Encounter (HOSPITAL_COMMUNITY): Payer: Self-pay | Admitting: Psychiatry

## 2021-04-14 DIAGNOSIS — F411 Generalized anxiety disorder: Secondary | ICD-10-CM | POA: Diagnosis not present

## 2021-04-14 MED ORDER — TRAZODONE HCL 100 MG PO TABS: 100.0000 mg | ORAL_TABLET | Freq: Every day | ORAL | 2 refills | Status: DC

## 2021-04-14 MED ORDER — HYDROXYZINE PAMOATE 50 MG PO CAPS: ORAL_CAPSULE | ORAL | 2 refills | Status: DC

## 2021-04-14 MED ORDER — PROPRANOLOL HCL 20 MG PO TABS: 20.0000 mg | ORAL_TABLET | Freq: Three times a day (TID) | ORAL | 2 refills | Status: DC
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
## 2021-05-30 DIAGNOSIS — Z20822 Contact with and (suspected) exposure to covid-19: Secondary | ICD-10-CM | POA: Diagnosis not present

## 2021-05-30 DIAGNOSIS — R0981 Nasal congestion: Secondary | ICD-10-CM | POA: Diagnosis not present

## 2021-07-08 ENCOUNTER — Encounter (HOSPITAL_COMMUNITY): Payer: Self-pay | Admitting: Psychiatry

## 2021-07-08 ENCOUNTER — Telehealth (INDEPENDENT_AMBULATORY_CARE_PROVIDER_SITE_OTHER): Payer: No Payment, Other | Admitting: Psychiatry

## 2021-07-08 ENCOUNTER — Other Ambulatory Visit: Payer: Self-pay

## 2021-07-08 DIAGNOSIS — F411 Generalized anxiety disorder: Secondary | ICD-10-CM

## 2021-07-08 MED ORDER — TRAZODONE HCL 100 MG PO TABS: 100.0000 mg | ORAL_TABLET | Freq: Every day | ORAL | 3 refills | Status: DC

## 2021-07-08 MED ORDER — HYDROXYZINE PAMOATE 50 MG PO CAPS: ORAL_CAPSULE | ORAL | 3 refills | Status: DC

## 2021-07-08 MED ORDER — PROPRANOLOL HCL 20 MG PO TABS: 20.0000 mg | ORAL_TABLET | Freq: Three times a day (TID) | ORAL | 3 refills | Status: DC
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
## 2021-07-14 ENCOUNTER — Telehealth (HOSPITAL_COMMUNITY): Payer: No Payment, Other | Admitting: Psychiatry

## 2021-07-15 DIAGNOSIS — H0288B Meibomian gland dysfunction left eye, upper and lower eyelids: Secondary | ICD-10-CM | POA: Diagnosis not present

## 2021-07-15 DIAGNOSIS — H0288A Meibomian gland dysfunction right eye, upper and lower eyelids: Secondary | ICD-10-CM | POA: Diagnosis not present

## 2021-07-15 DIAGNOSIS — H5213 Myopia, bilateral: Secondary | ICD-10-CM | POA: Diagnosis not present

## 2021-07-15 DIAGNOSIS — H52222 Regular astigmatism, left eye: Secondary | ICD-10-CM | POA: Diagnosis not present

## 2021-07-15 DIAGNOSIS — H0102A Squamous blepharitis right eye, upper and lower eyelids: Secondary | ICD-10-CM | POA: Diagnosis not present

## 2021-07-15 DIAGNOSIS — H40013 Open angle with borderline findings, low risk, bilateral: Secondary | ICD-10-CM | POA: Diagnosis not present

## 2021-10-08 ENCOUNTER — Encounter (HOSPITAL_COMMUNITY): Payer: Self-pay | Admitting: Psychiatry

## 2021-10-08 ENCOUNTER — Telehealth (INDEPENDENT_AMBULATORY_CARE_PROVIDER_SITE_OTHER): Payer: No Payment, Other | Admitting: Psychiatry

## 2021-10-08 DIAGNOSIS — F411 Generalized anxiety disorder: Secondary | ICD-10-CM

## 2021-10-08 MED ORDER — PROPRANOLOL HCL 20 MG PO TABS: 20.0000 mg | ORAL_TABLET | Freq: Three times a day (TID) | ORAL | 3 refills | Status: DC

## 2021-10-08 MED ORDER — HYDROXYZINE PAMOATE 50 MG PO CAPS: ORAL_CAPSULE | ORAL | 3 refills | Status: DC

## 2021-10-08 MED ORDER — TRAZODONE HCL 100 MG PO TABS: 100.0000 mg | ORAL_TABLET | Freq: Every day | ORAL | 3 refills | Status: DC
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
## 2021-10-13 ENCOUNTER — Telehealth (HOSPITAL_COMMUNITY): Payer: No Payment, Other | Admitting: Psychiatry

## 2021-11-08 DIAGNOSIS — Z20828 Contact with and (suspected) exposure to other viral communicable diseases: Secondary | ICD-10-CM | POA: Diagnosis not present

## 2021-11-08 DIAGNOSIS — Z20822 Contact with and (suspected) exposure to covid-19: Secondary | ICD-10-CM | POA: Diagnosis not present

## 2021-11-10 DIAGNOSIS — B349 Viral infection, unspecified: Secondary | ICD-10-CM | POA: Diagnosis not present

## 2021-11-10 DIAGNOSIS — J029 Acute pharyngitis, unspecified: Secondary | ICD-10-CM | POA: Diagnosis not present

## 2021-11-10 DIAGNOSIS — R059 Cough, unspecified: Secondary | ICD-10-CM | POA: Diagnosis not present

## 2021-11-10 DIAGNOSIS — Z20822 Contact with and (suspected) exposure to covid-19: Secondary | ICD-10-CM | POA: Diagnosis not present

## 2021-11-12 ENCOUNTER — Encounter (HOSPITAL_BASED_OUTPATIENT_CLINIC_OR_DEPARTMENT_OTHER): Payer: Self-pay

## 2021-11-12 ENCOUNTER — Encounter (HOSPITAL_BASED_OUTPATIENT_CLINIC_OR_DEPARTMENT_OTHER): Payer: Self-pay | Admitting: Family Medicine

## 2021-11-12 ENCOUNTER — Ambulatory Visit (INDEPENDENT_AMBULATORY_CARE_PROVIDER_SITE_OTHER): Payer: Self-pay | Admitting: Family Medicine

## 2021-11-12 ENCOUNTER — Other Ambulatory Visit: Payer: Self-pay

## 2021-11-12 VITALS — BP 132/84 | HR 72 | Ht 74.0 in | Wt 239.0 lb

## 2021-11-12 DIAGNOSIS — Z113 Encounter for screening for infections with a predominantly sexual mode of transmission: Secondary | ICD-10-CM | POA: Diagnosis not present

## 2021-11-12 DIAGNOSIS — G8929 Other chronic pain: Secondary | ICD-10-CM

## 2021-11-12 DIAGNOSIS — Z0000 Encounter for general adult medical examination without abnormal findings: Secondary | ICD-10-CM | POA: Diagnosis not present

## 2021-11-12 DIAGNOSIS — M545 Low back pain, unspecified: Secondary | ICD-10-CM

## 2021-11-12 DIAGNOSIS — N509 Disorder of male genital organs, unspecified: Secondary | ICD-10-CM

## 2021-11-12 NOTE — Progress Notes (Signed)
° ° °  Procedures performed today:    None.  Independent interpretation of notes and tests performed by another provider:   None.  Brief History, Exam, Impression, and Recommendations:    BP 132/84    Pulse 72    Ht 6\' 2"  (1.88 m)    Wt 239 lb (108.4 kg)    SpO2 98%    BMI 30.69 kg/m   Scrotal skin lesion Reports that he has noticed lesions on skin of scrotum for the past few years.  Reports that he typically does not have any pain or itching related to these lesions.  Occasionally will have some bleeding if they become irritated due to friction.  Lesions are primarily on scrotum, has not noticed any other lesions and genital region.  Feels that there has not been any significant change in lesions, no change in number of lesions or location.  Does have some concern for possible sexually transmitted infection, however is concerned for this seems to be low. On exam, scrotum with punctate red lesions, primarily noted at lateral surface bilaterally.  No inguinal swelling, no other skin lesions identified in inguinal area penile shaft.  No oozing, crusting, diffuse erythema. Uncertain etiology, possibly related to local skin irritation/blisters. Discussed options with patient, will proceed with initial STD screening today If any positive finding, treat accordingly If testing is negative, likely referred to neurology for further evaluation and recommendations Patient also voiced concerns regarding erectile dysfunction and that this has been going on for couple years.  If proceeding with urology referral, likely proceed with further discussion with urologist  Chronic low back pain Had been seen about 1 year ago for evaluation of episode of acute low back pain.  Continues to have chronic, intermittent low back pain, generally left worse than right.  No radicular symptoms, no associated numbness or tingling.  Has not had any weakness or changes in gait. On exam, no significant tenderness to palpation  over spinous processes in lumbar region, no significant tenderness to palpation over paraspinal muscles bilaterally.  Distal neurovascular exam intact Discussed general conservative measures which patient can utilize Recommend referral to physical therapy, patient wishes to hold off on this for now Did provide handout with home exercises for patient to initiate to help with symptoms Continue to monitor at future visits, if he does decide he wants to proceed with physical therapy, we can place referral  Plan for follow-up in about 1 month for CPE, will complete baseline labs today to be completed in anticipation of upcoming wellness visit   ___________________________________________ Tyia Binford de , MD, ABFM, CAQSM Primary Care and Sports Medicine [facility]

## 2021-11-12 NOTE — Assessment & Plan Note (Signed)
Reports that he has noticed lesions on skin of scrotum for the past few years.  Reports that he typically does not have any pain or itching related to these lesions.  Occasionally will have some bleeding if they become irritated due to friction.  Lesions are primarily on scrotum, has not noticed any other lesions and genital region.  Feels that there has not been any significant change in lesions, no change in number of lesions or location.  Does have some concern for possible sexually transmitted infection, however is concerned for this seems to be low. On exam, scrotum with punctate red lesions, primarily noted at lateral surface bilaterally.  No inguinal swelling, no other skin lesions identified in inguinal area penile shaft.  No oozing, crusting, diffuse erythema. Uncertain etiology, possibly related to local skin irritation/blisters. Discussed options with patient, will proceed with initial STD screening today If any positive finding, treat accordingly If testing is negative, likely referred to neurology for further evaluation and recommendations Patient also voiced concerns regarding erectile dysfunction and that this has been going on for couple years.  If proceeding with urology referral, likely proceed with further discussion with urologist

## 2021-11-12 NOTE — Assessment & Plan Note (Signed)
Had been seen about 1 year ago for evaluation of episode of acute low back pain.  Continues to have chronic, intermittent low back pain, generally left worse than right.  No radicular symptoms, no associated numbness or tingling.  Has not had any weakness or changes in gait. On exam, no significant tenderness to palpation over spinous processes in lumbar region, no significant tenderness to palpation over paraspinal muscles bilaterally.  Distal neurovascular exam intact Discussed general conservative measures which patient can utilize Recommend referral to physical therapy, patient wishes to hold off on this for now Did provide handout with home exercises for patient to initiate to help with symptoms Continue to monitor at future visits, if he does decide he wants to proceed with physical therapy, we can place referral

## 2021-11-12 NOTE — Patient Instructions (Signed)
°  Medication Instructions:  Your physician recommends that you continue on your current medications as directed. Please refer to the Current Medication list given to you today. --If you need a refill on any your medications before your next appointment, please call your pharmacy first. If no refills are authorized on file call the office.-- Lab Work: Your physician has recommended that you have lab work today: CBC, CMP, Lipid, A1C, TSH, HIV, Gonorrhea and Chlamydia, and Syphilis.  If you have labs (blood work) drawn today and your tests are completely normal, you will receive your results via MyChart message OR a phone call from our staff.  Please ensure you check your voicemail in the event that you authorized detailed messages to be left on a delegated number. If you have any lab test that is abnormal or we need to change your treatment, we will call you to review the results.  Follow-Up: Your next appointment:   Your physician recommends that you schedule a follow-up appointment in: 1 MONTH for CPE with Dr. de Peru  You will receive a text message or e-mail with a link to a survey about your care and experience with [place] today! We would greatly appreciate your feedback!   Thanks for letting us be apart of your health journey!!  Primary Care and Sports Medicine   Dr. Ceasar Mons Peru   We encourage you to activate your patient portal called "MyChart".  Sign up information is provided on this After Visit Summary.  MyChart is used to connect with patients for Virtual Visits (Telemedicine).  Patients are able to view lab/test results, encounter notes, upcoming appointments, etc.  Non-urgent messages can be sent to your provider as well. To learn more about what you can do with MyChart, please visit --  [email or web address].

## 2021-11-13 LAB — COMPREHENSIVE METABOLIC PANEL
ALT: 86 IU/L — ABNORMAL HIGH (ref 0–44)
AST: 43 IU/L — ABNORMAL HIGH (ref 0–40)
Albumin/Globulin Ratio: 1.3 (ref 1.2–2.2)
Albumin: 4.1 g/dL (ref 4.0–5.0)
Alkaline Phosphatase: 49 IU/L (ref 44–121)
BUN/Creatinine Ratio: 8 — ABNORMAL LOW (ref 9–20)
BUN: 11 mg/dL (ref 6–24)
Bilirubin Total: 0.5 mg/dL (ref 0.0–1.2)
CO2: 23 mmol/L (ref 20–29)
Calcium: 9.1 mg/dL (ref 8.7–10.2)
Chloride: 98 mmol/L (ref 96–106)
Creatinine, Ser: 1.31 mg/dL — ABNORMAL HIGH (ref 0.76–1.27)
Globulin, Total: 3.1 g/dL (ref 1.5–4.5)
Glucose: 122 mg/dL — ABNORMAL HIGH (ref 70–99)
Potassium: 4.2 mmol/L (ref 3.5–5.2)
Sodium: 137 mmol/L (ref 134–144)
Total Protein: 7.2 g/dL (ref 6.0–8.5)
eGFR: 69 mL/min/{1.73_m2} (ref >59)

## 2021-11-13 LAB — HIV ANTIBODY (ROUTINE TESTING W REFLEX): HIV Screen 4th Generation wRfx: NONREACTIVE

## 2021-11-13 LAB — HEMOGLOBIN A1C
Est. average glucose Bld gHb Est-mCnc: 120 mg/dL
Hgb A1c MFr Bld: 5.8 % — ABNORMAL HIGH (ref 4.8–5.6)

## 2021-11-13 LAB — CBC WITH DIFFERENTIAL/PLATELET
Basophils Absolute: 0.1 10*3/uL (ref 0.0–0.2)
Basos: 0 %
EOS (ABSOLUTE): 0.2 10*3/uL (ref 0.0–0.4)
Eos: 2 %
Hematocrit: 45.4 % (ref 37.5–51.0)
Hemoglobin: 15.3 g/dL (ref 13.0–17.7)
Immature Grans (Abs): 0 10*3/uL (ref 0.0–0.1)
Immature Granulocytes: 0 %
Lymphocytes Absolute: 3.1 10*3/uL (ref 0.7–3.1)
Lymphs: 25 %
MCH: 28.3 pg (ref 26.6–33.0)
MCHC: 33.7 g/dL (ref 31.5–35.7)
MCV: 84 fL (ref 79–97)
Monocytes Absolute: 0.8 10*3/uL (ref 0.1–0.9)
Monocytes: 6 %
Neutrophils Absolute: 8.2 10*3/uL — ABNORMAL HIGH (ref 1.4–7.0)
Neutrophils: 67 %
Platelets: 267 10*3/uL (ref 150–450)
RBC: 5.41 x10E6/uL (ref 4.14–5.80)
RDW: 13.1 % (ref 11.6–15.4)
WBC: 12.3 10*3/uL — ABNORMAL HIGH (ref 3.4–10.8)

## 2021-11-13 LAB — LIPID PANEL
Chol/HDL Ratio: 8.9 ratio — ABNORMAL HIGH (ref 0.0–5.0)
Cholesterol, Total: 170 mg/dL (ref 100–199)
HDL: 19 mg/dL — ABNORMAL LOW (ref >39)
LDL Chol Calc (NIH): 124 mg/dL — ABNORMAL HIGH (ref 0–99)
Triglycerides: 149 mg/dL (ref 0–149)
VLDL Cholesterol Cal: 27 mg/dL (ref 5–40)

## 2021-11-13 LAB — TSH RFX ON ABNORMAL TO FREE T4: TSH: 0.726 u[IU]/mL (ref 0.450–4.500)

## 2021-11-13 LAB — RPR, QUANT+TP ABS (REFLEX)
Rapid Plasma Reagin, Quant: 1:1 {titer} — ABNORMAL HIGH
T Pallidum Abs: REACTIVE — AB

## 2021-11-13 LAB — RPR: RPR Ser Ql: REACTIVE — AB

## 2021-11-15 LAB — CHLAMYDIA/GONOCOCCUS/TRICHOMONAS, NAA
Chlamydia by NAA: NEGATIVE
Gonococcus by NAA: NEGATIVE
Trich vag by NAA: NEGATIVE

## 2021-11-16 ENCOUNTER — Other Ambulatory Visit: Payer: Self-pay

## 2021-11-16 ENCOUNTER — Encounter (HOSPITAL_BASED_OUTPATIENT_CLINIC_OR_DEPARTMENT_OTHER): Payer: Self-pay

## 2021-11-16 ENCOUNTER — Ambulatory Visit (HOSPITAL_BASED_OUTPATIENT_CLINIC_OR_DEPARTMENT_OTHER): Payer: BC Managed Care – PPO

## 2021-11-18 ENCOUNTER — Other Ambulatory Visit: Payer: Self-pay

## 2021-11-18 ENCOUNTER — Ambulatory Visit (HOSPITAL_BASED_OUTPATIENT_CLINIC_OR_DEPARTMENT_OTHER): Payer: BC Managed Care – PPO

## 2021-11-18 ENCOUNTER — Telehealth (HOSPITAL_BASED_OUTPATIENT_CLINIC_OR_DEPARTMENT_OTHER): Payer: Self-pay

## 2021-11-18 DIAGNOSIS — A539 Syphilis, unspecified: Secondary | ICD-10-CM | POA: Diagnosis not present

## 2021-11-18 MED ORDER — PENICILLIN G BENZATHINE 1200000 UNIT/2ML IM SUSY
2.4000 10*6.[IU] | PREFILLED_SYRINGE | INTRAMUSCULAR | Status: AC
  Administered 2021-11-18: 2.4 10*6.[IU] via INTRAMUSCULAR

## 2021-11-18 NOTE — Telephone Encounter (Signed)
I called patient to let him know the penicillin was here unable to reach him no answer and no voicemail to leave a message. I did send a message to Maralyn Sago and ask her to try and reach patient.

## 2021-11-19 ENCOUNTER — Telehealth (HOSPITAL_BASED_OUTPATIENT_CLINIC_OR_DEPARTMENT_OTHER): Payer: Self-pay

## 2021-11-19 NOTE — Telephone Encounter (Signed)
Patient is aware and agreeable to lab results and recommendations Patient verbalized understanding Patient has questions about a hormonal supplement he is taking (OTC) and if is has any affect on his elevated liver enzymes I explained to patient that I would forward his question to Dr. de Peru for him to discuss with him

## 2021-11-19 NOTE — Telephone Encounter (Signed)
-----   Message from Hosie Poisson Peru, MD sent at 11/16/2021  9:12 AM EST ----- Low blood cell count slightly elevated, red blood cell count normal.  Electrolytes normal.  Slightly decreased kidney function, elevated liver enzymes.  Thyroid function is normal.  Lipid panel with elevated "bad" cholesterol.  Would initially focus on lifestyle modifications to address cholesterol issues - including dietary changes such as incorporating fresh fruits and vegetables, lean protein in the diet and reducing consumption of red meats, saturated fats, processed foods.  Recommend gradually increasing level of activity with eventual goal of about 150 minutes/week of moderate intensity aerobic exercise. Hemoglobin A1c which measures approximately 54-month average of blood sugars is slightly elevated at 5.8%.  This falls within "prediabetes" range.  This finding indicates an increased risk of developing diabetes in the future.  Primary recommendations are for lifestyle modifications including dietary changes and increase in weekly physical activity. Eventual goal should be started about 150 minutes/week of moderate intensity aerobic exercise.  Testing for chlamydia and gonorrhea is negative.  HIV testing is negative.  Syphilis testing is positive and thus warrants treatment with penicillin.  Will need to have titers drawn at time of antibiotic administration.  Will need to schedule for follow-up visits at 3, 6 and 12 months.

## 2021-11-20 LAB — RPR, QUANT+TP ABS (REFLEX)
Rapid Plasma Reagin, Quant: 1:1 {titer} — ABNORMAL HIGH
T Pallidum Abs: REACTIVE — AB

## 2021-11-20 LAB — RPR: RPR Ser Ql: REACTIVE — AB

## 2021-11-25 ENCOUNTER — Ambulatory Visit (INDEPENDENT_AMBULATORY_CARE_PROVIDER_SITE_OTHER): Payer: BC Managed Care – PPO | Admitting: Family Medicine

## 2021-11-25 ENCOUNTER — Other Ambulatory Visit: Payer: Self-pay

## 2021-11-25 DIAGNOSIS — A539 Syphilis, unspecified: Secondary | ICD-10-CM | POA: Diagnosis not present

## 2021-11-25 HISTORY — DX: Syphilis, unspecified: A53.9

## 2021-11-25 MED ORDER — PENICILLIN G BENZATHINE 1200000 UNIT/2ML IM SUSY
2400000.0000 [IU] | PREFILLED_SYRINGE | Freq: Once | INTRAMUSCULAR | Status: AC
  Administered 2021-11-25: 2400000 [IU] via INTRAMUSCULAR

## 2021-11-25 NOTE — Progress Notes (Signed)
Injection was tolerated well. ?

## 2021-12-02 ENCOUNTER — Other Ambulatory Visit: Payer: Self-pay

## 2021-12-02 ENCOUNTER — Ambulatory Visit (HOSPITAL_BASED_OUTPATIENT_CLINIC_OR_DEPARTMENT_OTHER): Payer: BC Managed Care – PPO

## 2021-12-02 DIAGNOSIS — A539 Syphilis, unspecified: Secondary | ICD-10-CM

## 2021-12-02 MED ORDER — PENICILLIN G BENZATHINE 1200000 UNIT/2ML IM SUSY: 2.4000 10*6.[IU] | PREFILLED_SYRINGE | Freq: Once | INTRAMUSCULAR | Status: DC

## 2021-12-02 NOTE — Progress Notes (Unsigned)
Patient came in today for penicillin injection ? # 3 for treatment. This was given intermuscular in bilateral buttocks. Dr Ihor Dow was available but not needed. All questions were answered. Patient made follow up appointment for 3 months to return to see Dr Ihor Dow. ?

## 2021-12-15 ENCOUNTER — Ambulatory Visit (INDEPENDENT_AMBULATORY_CARE_PROVIDER_SITE_OTHER): Payer: BC Managed Care – PPO | Admitting: Family Medicine

## 2021-12-15 ENCOUNTER — Other Ambulatory Visit: Payer: Self-pay

## 2021-12-15 ENCOUNTER — Encounter (HOSPITAL_BASED_OUTPATIENT_CLINIC_OR_DEPARTMENT_OTHER): Payer: Self-pay | Admitting: Family Medicine

## 2021-12-15 DIAGNOSIS — N529 Male erectile dysfunction, unspecified: Secondary | ICD-10-CM | POA: Insufficient documentation

## 2021-12-15 DIAGNOSIS — Z0000 Encounter for general adult medical examination without abnormal findings: Secondary | ICD-10-CM
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
## 2021-12-15 NOTE — Assessment & Plan Note (Signed)
Routine HCM labs reviewed. HCM reviewed/discussed. Anticipatory guidance regarding healthy weight, lifestyle and choices given. Recommend healthy diet.  Recommend approximately 150 minutes/week of moderate intensity exercise Recommend regular dental and vision exams Always use seatbelt/lap and shoulder restraints Recommend using smoke alarms and checking batteries at least twice a year Recommend using sunscreen when outside 

## 2021-12-15 NOTE — Patient Instructions (Signed)

## 2021-12-15 NOTE — Assessment & Plan Note (Addendum)
Reports that symptoms have been present for about 1 year.  Denies any issues with libido.  Denies any mood related concerns, no depressive symptoms at present, does have history of anxiety, however feels that symptoms are well controlled.  Reports that he has been receiving sildenafil through [facility] which he felt was helpful initially, however has been less effective recently. ?Will plan to check testosterone levels.  Did discuss role of replacement therapy if indicated.  Also discussed that occasionally insurance will require 2 readings indicating low testosterone such that if first test is low, would plan to repeat testing about 1 to 2 weeks later.  Patient will return to complete testing with labs between 8 to 10 AM ?

## 2021-12-18 DIAGNOSIS — M5134 Other intervertebral disc degeneration, thoracic region: Secondary | ICD-10-CM | POA: Diagnosis not present

## 2021-12-18 DIAGNOSIS — S39012A Strain of muscle, fascia and tendon of lower back, initial encounter: Secondary | ICD-10-CM | POA: Diagnosis not present

## 2021-12-23 ENCOUNTER — Telehealth (HOSPITAL_COMMUNITY): Payer: No Payment, Other | Admitting: Psychiatry

## 2021-12-30 ENCOUNTER — Telehealth (INDEPENDENT_AMBULATORY_CARE_PROVIDER_SITE_OTHER): Payer: BC Managed Care – PPO | Admitting: Psychiatry

## 2021-12-30 DIAGNOSIS — F411 Generalized anxiety disorder: Secondary | ICD-10-CM | POA: Diagnosis not present

## 2021-12-30 MED ORDER — PROPRANOLOL HCL 20 MG PO TABS: 20.0000 mg | ORAL_TABLET | Freq: Three times a day (TID) | ORAL | 2 refills | Status: DC

## 2021-12-30 MED ORDER — TRAZODONE HCL 100 MG PO TABS: 100.0000 mg | ORAL_TABLET | Freq: Every day | ORAL | 2 refills | Status: DC

## 2021-12-30 MED ORDER — HYDROXYZINE PAMOATE 50 MG PO CAPS: ORAL_CAPSULE | ORAL | 2 refills | Status: DC
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
## 2022-01-13 DIAGNOSIS — M5451 Vertebrogenic low back pain: Secondary | ICD-10-CM | POA: Diagnosis not present

## 2022-01-18 DIAGNOSIS — M5451 Vertebrogenic low back pain: Secondary | ICD-10-CM | POA: Diagnosis not present

## 2022-01-25 DIAGNOSIS — M5451 Vertebrogenic low back pain: Secondary | ICD-10-CM | POA: Diagnosis not present

## 2022-03-04 ENCOUNTER — Encounter (HOSPITAL_BASED_OUTPATIENT_CLINIC_OR_DEPARTMENT_OTHER): Payer: Self-pay | Admitting: Family Medicine

## 2022-03-04 ENCOUNTER — Ambulatory Visit (INDEPENDENT_AMBULATORY_CARE_PROVIDER_SITE_OTHER): Payer: BC Managed Care – PPO | Admitting: Family Medicine

## 2022-03-04 DIAGNOSIS — A539 Syphilis, unspecified: Secondary | ICD-10-CM | POA: Insufficient documentation

## 2022-03-04 NOTE — Assessment & Plan Note (Signed)
Patient completed prior penicillin injections without issue.  Denies experiencing any issues with fevers, sweats, flushing, no side effects at all or transient reaction which can occur with treatment of syphilis.  He denies any new rashes or lesions of concern.  He is not concerned currently about any exposure to STDs.  At this time, can continue with monitoring, we will plan for follow-up in about 3 months and will check labs at that time including RPR titer to compare to pretreatment testing

## 2022-03-04 NOTE — Progress Notes (Signed)
    Procedures performed today:    None.  Independent interpretation of notes and tests performed by another provider:   None.  Brief History, Exam, Impression, and Recommendations:    BP 127/84   Pulse 76   Ht 6\' 3"  (1.905 m)   Wt 229 lb 12.8 oz (104.2 kg)   SpO2 98%   BMI 28.72 kg/m   Syphilis Patient completed prior penicillin injections without issue.  Denies experiencing any issues with fevers, sweats, flushing, no side effects at all or transient reaction which can occur with treatment of syphilis.  He denies any new rashes or lesions of concern.  He is not concerned currently about any exposure to STDs.  At this time, can continue with monitoring, we will plan for follow-up in about 3 months and will check labs at that time including RPR titer to compare to pretreatment testing  Return in about 3 months (around 06/04/2022) for Syphilis follow-up, nurse visit for labs a few days prior.   ___________________________________________ Maigen Mozingo de 08/04/2022, MD, ABFM, CAQSM Primary Care and Sports Medicine [facility]

## 2022-03-23 ENCOUNTER — Encounter (HOSPITAL_COMMUNITY): Payer: Self-pay | Admitting: Psychiatry

## 2022-03-23 ENCOUNTER — Telehealth (INDEPENDENT_AMBULATORY_CARE_PROVIDER_SITE_OTHER): Payer: BC Managed Care – PPO | Admitting: Psychiatry

## 2022-03-23 DIAGNOSIS — F411 Generalized anxiety disorder: Secondary | ICD-10-CM | POA: Diagnosis not present

## 2022-03-23 MED ORDER — HYDROXYZINE PAMOATE 50 MG PO CAPS: ORAL_CAPSULE | ORAL | 3 refills | Status: DC

## 2022-03-23 MED ORDER — PROPRANOLOL HCL 20 MG PO TABS: 20.0000 mg | ORAL_TABLET | Freq: Three times a day (TID) | ORAL | 3 refills | Status: DC

## 2022-03-23 MED ORDER — TRAZODONE HCL 100 MG PO TABS: 100.0000 mg | ORAL_TABLET | Freq: Every day | ORAL | 3 refills | Status: DC
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
## 2022-04-05 DIAGNOSIS — M545 Low back pain, unspecified: Secondary | ICD-10-CM | POA: Diagnosis not present

## 2022-05-12 DIAGNOSIS — L03116 Cellulitis of left lower limb: Secondary | ICD-10-CM | POA: Diagnosis not present

## 2022-05-24 ENCOUNTER — Ambulatory Visit (HOSPITAL_BASED_OUTPATIENT_CLINIC_OR_DEPARTMENT_OTHER): Payer: BC Managed Care – PPO

## 2022-05-24 DIAGNOSIS — A539 Syphilis, unspecified: Secondary | ICD-10-CM | POA: Diagnosis not present

## 2022-05-24 DIAGNOSIS — N529 Male erectile dysfunction, unspecified: Secondary | ICD-10-CM | POA: Diagnosis not present

## 2022-05-25 LAB — RAPID PLASMA REAGIN,QUANTITATION: RPR Titer: NONREACTIVE

## 2022-05-26 DIAGNOSIS — Z8781 Personal history of (healed) traumatic fracture: Secondary | ICD-10-CM | POA: Diagnosis not present

## 2022-05-26 DIAGNOSIS — R1032 Left lower quadrant pain: Secondary | ICD-10-CM | POA: Diagnosis not present

## 2022-05-27 DIAGNOSIS — R1032 Left lower quadrant pain: Secondary | ICD-10-CM | POA: Diagnosis not present

## 2022-05-27 DIAGNOSIS — K4090 Unilateral inguinal hernia, without obstruction or gangrene, not specified as recurrent: Secondary | ICD-10-CM | POA: Diagnosis not present

## 2022-05-31 LAB — TESTOSTERONE,FREE AND TOTAL
Testosterone, Free: 14.2 pg/mL (ref 6.8–21.5)
Testosterone: 297 ng/dL (ref 264–916)

## 2022-06-01 DIAGNOSIS — Z6828 Body mass index (BMI) 28.0-28.9, adult: Secondary | ICD-10-CM | POA: Diagnosis not present

## 2022-06-01 DIAGNOSIS — U071 COVID-19: Secondary | ICD-10-CM | POA: Diagnosis not present

## 2022-06-03 ENCOUNTER — Ambulatory Visit (HOSPITAL_BASED_OUTPATIENT_CLINIC_OR_DEPARTMENT_OTHER): Payer: Self-pay | Admitting: Family Medicine

## 2022-06-04 ENCOUNTER — Encounter (HOSPITAL_BASED_OUTPATIENT_CLINIC_OR_DEPARTMENT_OTHER): Payer: Self-pay | Admitting: Family Medicine

## 2022-06-04 DIAGNOSIS — K4090 Unilateral inguinal hernia, without obstruction or gangrene, not specified as recurrent: Secondary | ICD-10-CM

## 2022-06-07 DIAGNOSIS — K4090 Unilateral inguinal hernia, without obstruction or gangrene, not specified as recurrent: Secondary | ICD-10-CM | POA: Diagnosis not present

## 2022-06-16 ENCOUNTER — Ambulatory Visit (HOSPITAL_BASED_OUTPATIENT_CLINIC_OR_DEPARTMENT_OTHER): Payer: BC Managed Care – PPO | Admitting: Family Medicine

## 2022-06-17 ENCOUNTER — Ambulatory Visit (HOSPITAL_BASED_OUTPATIENT_CLINIC_OR_DEPARTMENT_OTHER): Payer: BC Managed Care – PPO | Admitting: Family Medicine

## 2022-06-17 ENCOUNTER — Encounter (HOSPITAL_BASED_OUTPATIENT_CLINIC_OR_DEPARTMENT_OTHER): Payer: Self-pay | Admitting: Family Medicine

## 2022-06-17 VITALS — BP 138/94 | HR 85 | Ht 75.0 in | Wt 273.0 lb

## 2022-06-17 DIAGNOSIS — A539 Syphilis, unspecified: Secondary | ICD-10-CM

## 2022-06-17 DIAGNOSIS — K4090 Unilateral inguinal hernia, without obstruction or gangrene, not specified as recurrent: Secondary | ICD-10-CM | POA: Diagnosis not present

## 2022-06-17 NOTE — Assessment & Plan Note (Signed)
Patient has been having ongoing left abdominal/hip/inguinal pain.  He did have evaluation at urgent care since last visit with me and ultimately had CT scan completed which showed evidence of fat containing left inguinal hernia.  Due to his symptoms and findings on CT scan, he was referred to a general surgeon near the urgent care, however he request to have a referral to provider here in [place].  This referral was placed and he has scheduled appointment for next week.  He does continue to have notable pain with suboptimal control with OTC medications, NSAIDs.  He feels that pain has been gradually progressive.  It is worsened with activities at work given that his work is relatively labor-intensive.  He reports having intermittent diarrhea, however has not had any blood in the stool.  No fevers, chills, sweats. On exam, patient is in no acute distress, vital signs stable, afebrile.  Cardiovascular exam with regular rate and rhythm, no murmur appreciated.  Abdomen with normal bowel sounds, soft, mild tenderness to palpation in the left lower quadrant, no rebound or guarding.  He is in some discomfort and transitioning from a seated position to laying Discussed that ultimately he needs to have evaluation with surgeon for definitive treatment.  Discussed concerning findings and symptoms.  Particularly if having progressive symptoms, bloody stool, vomiting, would present to emergency department for further evaluation.  Due to this, would not want to progress pain medication any further for fear that it may mask worsening symptoms for which he should have more emergent evaluation for.  At this time, would continue with NSAIDs for pain control.  Discussed option of Toradol in the office today which he was amenable to, administered in office today Can continue with oral NSAID, advised to resume this tomorrow however given Toradol administration today

## 2022-06-17 NOTE — Progress Notes (Signed)
    Procedures performed today:    None.  Independent interpretation of notes and tests performed by another provider:   None.  Brief History, Exam, Impression, and Recommendations:    BP (!) 138/94   Pulse 85   Ht 6\' 3"  (1.905 m)   Wt 273 lb (123.8 kg)   SpO2 100%   BMI 34.12 kg/m   Syphilis Patient reports that he is doing well.  He did have labs completed recently which showed that RPR was nonreactive with less than a one-to-one titer.  Discussed that lab findings are reassuring and would indicate good response to antibiotics previously administered We will plan to follow-up in about 6 months to repeat RPR titer again and assess for any interval changes or new concerns  Inguinal hernia Patient has been having ongoing left abdominal/hip/inguinal pain.  He did have evaluation at urgent care since last visit with me and ultimately had CT scan completed which showed evidence of fat containing left inguinal hernia.  Due to his symptoms and findings on CT scan, he was referred to a general surgeon near the urgent care, however he request to have a referral to provider here in [place].  This referral was placed and he has scheduled appointment for next week.  He does continue to have notable pain with suboptimal control with OTC medications, NSAIDs.  He feels that pain has been gradually progressive.  It is worsened with activities at work given that his work is relatively labor-intensive.  He reports having intermittent diarrhea, however has not had any blood in the stool.  No fevers, chills, sweats. On exam, patient is in no acute distress, vital signs stable, afebrile.  Cardiovascular exam with regular rate and rhythm, no murmur appreciated.  Abdomen with normal bowel sounds, soft, mild tenderness to palpation in the left lower quadrant, no rebound or guarding.  He is in some discomfort and transitioning from a seated position to laying Discussed that ultimately he needs to have  evaluation with surgeon for definitive treatment.  Discussed concerning findings and symptoms.  Particularly if having progressive symptoms, bloody stool, vomiting, would present to emergency department for further evaluation.  Due to this, would not want to progress pain medication any further for fear that it may mask worsening symptoms for which he should have more emergent evaluation for.  At this time, would continue with NSAIDs for pain control.  Discussed option of Toradol in the office today which he was amenable to, administered in office today Can continue with oral NSAID, advised to resume this tomorrow however given Toradol administration today  Return in about 6 months (around 12/16/2022) for NV for labs a few days prior.   ___________________________________________ [name] de [place], MD, ABFM, CAQSM [facility] [place]

## 2022-06-17 NOTE — Assessment & Plan Note (Signed)
Patient reports that he is doing well.  He did have labs completed recently which showed that RPR was nonreactive with less than a one-to-one titer.  Discussed that lab findings are reassuring and would indicate good response to antibiotics previously administered We will plan to follow-up in about 6 months to repeat RPR titer again and assess for any interval changes or new concerns

## 2022-06-17 NOTE — Patient Instructions (Signed)
  Medication Instructions:  Your physician recommends that you continue on your current medications as directed. Please refer to the Current Medication list given to you today. --If you need a refill on any your medications before your next appointment, please call your pharmacy first. If no refills are authorized on file call the office.-- Lab Work: Your physician has recommended that you have lab work today: No If you have labs (blood work) drawn today and your tests are completely normal, you will receive your results via MyChart message OR a phone call from our staff.  Please ensure you check your voicemail in the event that you authorized detailed messages to be left on a delegated number. If you have any lab test that is abnormal or we need to change your treatment, we will call you to review the results.  Referrals/Procedures/Imaging: No  Follow-Up: Your next appointment:   Your physician recommends that you schedule a follow-up appointment in: 6 month follow-up with Dr. de [place].  You will receive a text message or e-mail with a link to a survey about your care and experience with us today! We would greatly appreciate your feedback!   Thanks for letting us be apart of your health journey!!  Primary Care and Sports Medicine   Dr. Raymond de [place]   We encourage you to activate your patient portal called "MyChart".  Sign up information is provided on this After Visit Summary.  MyChart is used to connect with patients for Virtual Visits (Telemedicine).  Patients are able to view lab/test results, encounter notes, upcoming appointments, etc.  Non-urgent messages can be sent to your provider as well. To learn more about what you can do with MyChart, please visit --  [email or web address].    

## 2022-06-25 ENCOUNTER — Other Ambulatory Visit: Payer: Self-pay | Admitting: Surgery

## 2022-06-25 DIAGNOSIS — K4090 Unilateral inguinal hernia, without obstruction or gangrene, not specified as recurrent: Secondary | ICD-10-CM | POA: Diagnosis not present

## 2022-06-28 ENCOUNTER — Encounter (HOSPITAL_COMMUNITY): Payer: Self-pay | Admitting: Psychiatry

## 2022-06-28 ENCOUNTER — Telehealth (INDEPENDENT_AMBULATORY_CARE_PROVIDER_SITE_OTHER): Payer: BC Managed Care – PPO | Admitting: Psychiatry

## 2022-06-28 DIAGNOSIS — F411 Generalized anxiety disorder: Secondary | ICD-10-CM | POA: Diagnosis not present

## 2022-06-28 MED ORDER — HYDROXYZINE PAMOATE 50 MG PO CAPS: ORAL_CAPSULE | ORAL | 3 refills | Status: DC

## 2022-06-28 MED ORDER — PROPRANOLOL HCL 20 MG PO TABS: 20.0000 mg | ORAL_TABLET | Freq: Three times a day (TID) | ORAL | 3 refills | Status: DC

## 2022-06-28 MED ORDER — TRAZODONE HCL 100 MG PO TABS: 100.0000 mg | ORAL_TABLET | Freq: Every day | ORAL | 3 refills | Status: DC
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
## 2022-06-29 ENCOUNTER — Encounter (HOSPITAL_BASED_OUTPATIENT_CLINIC_OR_DEPARTMENT_OTHER): Payer: Self-pay | Admitting: Surgery

## 2022-06-30 ENCOUNTER — Encounter (HOSPITAL_BASED_OUTPATIENT_CLINIC_OR_DEPARTMENT_OTHER): Payer: Self-pay | Admitting: Surgery

## 2022-06-30 ENCOUNTER — Other Ambulatory Visit: Payer: Self-pay

## 2022-06-30 NOTE — Progress Notes (Signed)
Spoke w/ via phone for pre-op interview---pt Lab needs dos---- none              Lab results------ COVID test -----patient states asymptomatic no test needed Arrive at -------900 am 07-08-2022 NPO after MN NO Solid Food.  Clear liquids from MN until---800 am Med rec completed Medications to take morning of surgery -----propranolol, hydraxyzine prn, nexium Diabetic medication -----n/a Patient instructed no nail polish to be worn day of surgery Patient instructed to bring photo id and insurance card day of surgery Patient aware to have Driver (ride ) / caregiver   wife [name] will stay  for 24 hours after surgery  Patient Special Instructions -----none Pre-Op special Istructions -----none Patient verbalized understanding of instructions that were given at this phone interview. Patient denies shortness of breath, chest pain, fever, cough at this phone interview.

## 2022-07-07 NOTE — H&P (Signed)
REFERRING PHYSICIAN: de [place], Raymond Joseph*  PROVIDER: Beverlee Nims, MD  MRN: [id number] DOB: Dec 08, 1975  Subjective   Chief Complaint: New Consultation (Unilateral Inguinal Hernia )   History of Present Illness: [name] is a 46 y.o. male who is seen as an office consultation for evaluation of New Consultation (Unilateral Inguinal Hernia ) .   This is a 46 year old gentleman who was referred here for evaluation of a newly diagnosed left inguinal hernia. He first noticed the discomfort which was quite sharp in the inguinal area. He originally thought it was secondary to his chronic back pain but then he noticed some swelling in the groin. He reports pain will go down into the scrotum and onto his thigh. He does have a previous history of a hip fracture. He describes the pain as sharp and intermittent and moderate to severe. He underwent a CT scan of his abdomen and pelvis which showed a small left inguinal hernia containing fat. He has had no symptoms of a bowel obstruction.  Review of Systems: A complete review of systems was obtained from the patient. I have reviewed this information and discussed as appropriate with the patient. See HPI as well for other ROS.  ROS   Medical History: Past Medical History:  Diagnosis Date  Anxiety  Arthritis   Patient Active Problem List  Diagnosis  Anxiety  Chronic low back pain  Closed left acetabular fracture (CMS-HCC)  Erectile dysfunction  Generalized anxiety disorder  Fall  Inguinal hernia  Nicotine dependence  Panic attacks  Pelvic fracture (CMS-HCC)  Scrotal skin lesion  Syphilis  Wellness examination   Past Surgical History:  Procedure Laterality Date  Hip/Pelvis Surgery 01/2017    Allergies  Allergen Reactions  Aspirin Rash  Haloperidol Other (See Comments)  Locks up muscles Locks up muscles  Olanzapine Rash  Cephalexin Itching  Buspirone Rash   Current Outpatient Medications on File Prior to  Visit  Medication Sig Dispense Refill  acetaminophen (TYLENOL) 500 MG tablet Take by mouth  hydrOXYzine (VISTARIL) 50 MG capsule TAKE 1 CAPSULE BY MOUTH FOUR TIMES DAILY AS NEEDED  naproxen sodium (ALEVE) 220 MG tablet Take 220 mg by mouth 2 (two) times daily with meals  propranoloL (INDERAL) 20 MG tablet  traZODone (DESYREL) 100 MG tablet   No current facility-administered medications on file prior to visit.   History reviewed. No pertinent family history.   Social History   Tobacco Use  Smoking Status Former  Years: 8.00  Types: Cigarettes  Smokeless Tobacco Never    Social History   Socioeconomic History  Marital status: Single  Tobacco Use  Smoking status: Former  Years: 8.00  Types: Cigarettes  Smokeless tobacco: Never  Substance and Sexual Activity  Alcohol use: Not Currently  Drug use: Never   Objective:   Vitals:   BP: 122/82  Pulse: (!) 111  Temp: 37.1 C (98.7 F)  SpO2: 98%  Weight: (!) 107.8 kg (237 lb 9.6 oz)  Height: 190.5 cm (6\' 3" )   Body mass index is 29.7 kg/m.  Physical Exam   He appears well on exam.  Abdomen is soft. He has a well-healed lower midline abdominal incision. There is a small, tender but easily reducible left inguinal hernia without evidence of right inguinal hernia or umbilical hernia  Labs, Imaging and Diagnostic Testing: I have reviewed his notes in the electronic medical records as well as his notes from the referring provider and a CAT scan of his abdomen pelvis  Assessment  and Plan:   Diagnoses and all orders for this visit:  Left inguinal hernia  Other orders - traMADoL (ULTRAM) 50 mg tablet; Take 1 tablet (50 mg total) by mouth every 6 (six) hours as needed for Pain for up to 7 days    His symptoms are consistent with the left inguinal hernia felt on physical examination and seen on CT scan. I discussed the diagnosis of hernias with the patient and his wife. We discussed abdominal wall anatomy in detail. We  discussed causes of hernias. As his hernia is symptomatic, repair with mesh is recommended. I discussed the reasons to use mesh. As he has had a prior laparotomy, I would recommend an open left inguinal hernia repair with anesthesia performing a preoperative tap block. I explained the surgical procedure in detail. We discussed the risks which includes but is not limited to bleeding, infection, injury to surrounding structures, chronic pain, nerve entrapment, hernia recurrence, the use of mesh, cardiopulmonary issues, postoperative recovery, etc. He understands and wished to proceed with surgery. We will try to schedule this more urgently given his discomfort he is experiencing. We discussed signs and symptoms of incarceration of his as well. Between now and the time of surgery, if he acutely worsens, he will call the office

## 2022-07-08 ENCOUNTER — Encounter (HOSPITAL_BASED_OUTPATIENT_CLINIC_OR_DEPARTMENT_OTHER): Payer: Self-pay | Admitting: Surgery

## 2022-07-08 ENCOUNTER — Ambulatory Visit (HOSPITAL_BASED_OUTPATIENT_CLINIC_OR_DEPARTMENT_OTHER): Payer: BC Managed Care – PPO | Admitting: Anesthesiology

## 2022-07-08 ENCOUNTER — Encounter (HOSPITAL_BASED_OUTPATIENT_CLINIC_OR_DEPARTMENT_OTHER)
Admission: RE | Disposition: A | Discharge status: Home / Self Care | Payer: Self-pay | Source: Home / Self Care | Attending: Surgery

## 2022-07-08 ENCOUNTER — Ambulatory Visit (HOSPITAL_BASED_OUTPATIENT_CLINIC_OR_DEPARTMENT_OTHER)
Admission: RE | Admit: 2022-07-08 | Discharge: 2022-07-08 | Disposition: A | Discharge status: Home / Self Care | Payer: BC Managed Care – PPO | Attending: Surgery | Admitting: Surgery

## 2022-07-08 DIAGNOSIS — F418 Other specified anxiety disorders: Secondary | ICD-10-CM | POA: Diagnosis not present

## 2022-07-08 DIAGNOSIS — Z87891 Personal history of nicotine dependence: Secondary | ICD-10-CM | POA: Diagnosis not present

## 2022-07-08 DIAGNOSIS — K4090 Unilateral inguinal hernia, without obstruction or gangrene, not specified as recurrent: Secondary | ICD-10-CM | POA: Diagnosis not present

## 2022-07-08 DIAGNOSIS — Z01818 Encounter for other preprocedural examination: Secondary | ICD-10-CM

## 2022-07-08 DIAGNOSIS — G8918 Other acute postprocedural pain: Secondary | ICD-10-CM | POA: Diagnosis not present

## 2022-07-08 DIAGNOSIS — F319 Bipolar disorder, unspecified: Secondary | ICD-10-CM | POA: Diagnosis not present

## 2022-07-08 HISTORY — DX: Gastro-esophageal reflux disease without esophagitis: K21.9

## 2022-07-08 HISTORY — DX: Complete loss of teeth, unspecified cause, unspecified class: K08.109

## 2022-07-08 HISTORY — PX: INGUINAL HERNIA REPAIR: SHX194

## 2022-07-08 HISTORY — DX: Prediabetes: R73.03

## 2022-07-08 HISTORY — DX: Presence of spectacles and contact lenses: Z97.3

## 2022-07-08 HISTORY — DX: Dorsalgia, unspecified: M54.9

## 2022-07-08 HISTORY — DX: Unilateral inguinal hernia, without obstruction or gangrene, not specified as recurrent: K40.90

## 2022-07-08 HISTORY — DX: Complete loss of teeth, unspecified cause, unspecified class: Z97.2

## 2022-07-08 SURGERY — REPAIR, HERNIA, INGUINAL, ADULT
Anesthesia: General | Site: Groin | Laterality: Left

## 2022-07-08 MED ORDER — FENTANYL CITRATE (PF) 100 MCG/2ML IJ SOLN
INTRAMUSCULAR | Status: DC | PRN
  Administered 2022-07-08: 50 ug via INTRAVENOUS
  Administered 2022-07-08: 25 ug via INTRAVENOUS

## 2022-07-08 MED ORDER — AMISULPRIDE (ANTIEMETIC) 5 MG/2ML IV SOLN: 10.0000 mg | Freq: Once | INTRAVENOUS | Status: DC | PRN

## 2022-07-08 MED ORDER — DEXAMETHASONE SODIUM PHOSPHATE 10 MG/ML IJ SOLN
INTRAMUSCULAR | Status: DC | PRN
  Administered 2022-07-08: 5 mg via INTRAVENOUS

## 2022-07-08 MED ORDER — FENTANYL CITRATE (PF) 100 MCG/2ML IJ SOLN
100.0000 ug | Freq: Once | INTRAMUSCULAR | Status: AC
  Administered 2022-07-08: 100 ug via INTRAVENOUS

## 2022-07-08 MED ORDER — KETOROLAC TROMETHAMINE 30 MG/ML IJ SOLN
INTRAMUSCULAR | Status: AC
  Filled 2022-07-08: qty 1

## 2022-07-08 MED ORDER — FENTANYL CITRATE (PF) 100 MCG/2ML IJ SOLN
INTRAMUSCULAR | Status: AC
  Filled 2022-07-08: qty 2

## 2022-07-08 MED ORDER — PROPOFOL 10 MG/ML IV BOLUS
INTRAVENOUS | Status: DC | PRN
  Administered 2022-07-08: 200 mg via INTRAVENOUS

## 2022-07-08 MED ORDER — OXYCODONE HCL 5 MG PO TABS
5.0000 mg | ORAL_TABLET | Freq: Once | ORAL | Status: AC | PRN
  Administered 2022-07-08: 5 mg via ORAL

## 2022-07-08 MED ORDER — PROMETHAZINE HCL 25 MG/ML IJ SOLN: 6.2500 mg | INTRAMUSCULAR | Status: DC | PRN

## 2022-07-08 MED ORDER — LIDOCAINE 2% (20 MG/ML) 5 ML SYRINGE
INTRAMUSCULAR | Status: DC | PRN
  Administered 2022-07-08: 60 mg via INTRAVENOUS

## 2022-07-08 MED ORDER — PROPOFOL 10 MG/ML IV BOLUS
INTRAVENOUS | Status: AC
  Filled 2022-07-08: qty 20

## 2022-07-08 MED ORDER — OXYCODONE HCL 5 MG PO TABS: 5.0000 mg | ORAL_TABLET | Freq: Four times a day (QID) | ORAL | 0 refills | Status: DC | PRN

## 2022-07-08 MED ORDER — KETOROLAC TROMETHAMINE 30 MG/ML IJ SOLN
INTRAMUSCULAR | Status: DC | PRN
  Administered 2022-07-08: 30 mg via INTRAVENOUS

## 2022-07-08 MED ORDER — ENSURE PRE-SURGERY PO LIQD: 296.0000 mL | Freq: Once | ORAL | Status: DC

## 2022-07-08 MED ORDER — ACETAMINOPHEN 500 MG PO TABS
1000.0000 mg | ORAL_TABLET | ORAL | Status: AC
  Administered 2022-07-08: 1000 mg via ORAL

## 2022-07-08 MED ORDER — ACETAMINOPHEN 500 MG PO TABS
ORAL_TABLET | ORAL | Status: AC
  Filled 2022-07-08: qty 2

## 2022-07-08 MED ORDER — MEPERIDINE HCL 25 MG/ML IJ SOLN: 6.2500 mg | INTRAMUSCULAR | Status: DC | PRN

## 2022-07-08 MED ORDER — HYDROMORPHONE HCL 1 MG/ML IJ SOLN: 0.2500 mg | INTRAMUSCULAR | Status: DC | PRN

## 2022-07-08 MED ORDER — ROPIVACAINE HCL 5 MG/ML IJ SOLN
INTRAMUSCULAR | Status: DC | PRN
  Administered 2022-07-08: 30 mL via PERINEURAL

## 2022-07-08 MED ORDER — MIDAZOLAM HCL 2 MG/2ML IJ SOLN
2.0000 mg | Freq: Once | INTRAMUSCULAR | Status: AC
  Administered 2022-07-08: 2 mg via INTRAVENOUS

## 2022-07-08 MED ORDER — CHLORHEXIDINE GLUCONATE CLOTH 2 % EX PADS: 6.0000 | MEDICATED_PAD | Freq: Once | CUTANEOUS | Status: DC

## 2022-07-08 MED ORDER — 0.9 % SODIUM CHLORIDE (POUR BTL) OPTIME
TOPICAL | Status: DC | PRN
  Administered 2022-07-08: 500 mL

## 2022-07-08 MED ORDER — ONDANSETRON HCL 4 MG/2ML IJ SOLN
INTRAMUSCULAR | Status: DC | PRN
  Administered 2022-07-08: 4 mg via INTRAVENOUS

## 2022-07-08 MED ORDER — CIPROFLOXACIN IN D5W 400 MG/200ML IV SOLN
400.0000 mg | INTRAVENOUS | Status: AC
  Administered 2022-07-08: 400 mg via INTRAVENOUS

## 2022-07-08 MED ORDER — CIPROFLOXACIN IN D5W 400 MG/200ML IV SOLN
INTRAVENOUS | Status: AC
  Filled 2022-07-08: qty 200

## 2022-07-08 MED ORDER — ONDANSETRON HCL 4 MG/2ML IJ SOLN
INTRAMUSCULAR | Status: AC
  Filled 2022-07-08: qty 2

## 2022-07-08 MED ORDER — MIDAZOLAM HCL 2 MG/2ML IJ SOLN
INTRAMUSCULAR | Status: AC
  Filled 2022-07-08: qty 2

## 2022-07-08 MED ORDER — BUPIVACAINE-EPINEPHRINE 0.5% -1:200000 IJ SOLN
INTRAMUSCULAR | Status: DC | PRN
  Administered 2022-07-08: 10 mL

## 2022-07-08 MED ORDER — LACTATED RINGERS IV SOLN: INTRAVENOUS | Status: DC

## 2022-07-08 MED ORDER — OXYCODONE HCL 5 MG/5ML PO SOLN: 5.0000 mg | Freq: Once | ORAL | Status: AC | PRN

## 2022-07-08 MED ORDER — MIDAZOLAM HCL 2 MG/2ML IJ SOLN
INTRAMUSCULAR | Status: DC | PRN
  Administered 2022-07-08: 2 mg via INTRAVENOUS

## 2022-07-08 MED ORDER — OXYCODONE HCL 5 MG PO TABS
ORAL_TABLET | ORAL | Status: AC
  Filled 2022-07-08: qty 1

## 2022-07-08 MED ORDER — DEXAMETHASONE SODIUM PHOSPHATE 10 MG/ML IJ SOLN
INTRAMUSCULAR | Status: AC
  Filled 2022-07-08: qty 1

## 2022-07-08 SURGICAL SUPPLY — 34 items
ADH SKN CLS APL DERMABOND .7 (GAUZE/BANDAGES/DRESSINGS) ×1
APL PRP STRL LF DISP 70% ISPRP (MISCELLANEOUS) ×1
BLADE CLIPPER SENSICLIP SURGIC (BLADE) IMPLANT
BLADE SURG 10 STRL SS (BLADE) ×1 IMPLANT
BLADE SURG 15 STRL LF DISP TIS (BLADE) ×1 IMPLANT
BLADE SURG 15 STRL SS (BLADE) ×1
CHLORAPREP W/TINT 26 (MISCELLANEOUS) ×1 IMPLANT
COVER BACK TABLE 60X90IN (DRAPES) ×1 IMPLANT
COVER MAYO STAND STRL (DRAPES) ×1 IMPLANT
DERMABOND ADVANCED .7 DNX12 (GAUZE/BANDAGES/DRESSINGS) ×1 IMPLANT
DRAIN PENROSE 0.5X18 (DRAIN) IMPLANT
DRAPE LAPAROTOMY TRNSV 102X78 (DRAPES) ×1 IMPLANT
DRAPE UTILITY XL STRL (DRAPES) ×1 IMPLANT
ELECT REM PT RETURN 9FT ADLT (ELECTROSURGICAL) ×1
ELECTRODE REM PT RTRN 9FT ADLT (ELECTROSURGICAL) ×1 IMPLANT
GAUZE 4X4 16PLY ~~LOC~~+RFID DBL (SPONGE) ×1 IMPLANT
GOWN STRL REUS W/TWL LRG LVL3 (GOWN DISPOSABLE) ×1 IMPLANT
KIT TURNOVER CYSTO (KITS) ×1 IMPLANT
MESH PARIETEX PROGRIP LEFT (Mesh General) IMPLANT
NDL HYPO 25X1 1.5 SAFETY (NEEDLE) ×1 IMPLANT
NEEDLE HYPO 25X1 1.5 SAFETY (NEEDLE) ×1 IMPLANT
NS IRRIG 500ML POUR BTL (IV SOLUTION) ×1 IMPLANT
PACK BASIN DAY SURGERY FS (CUSTOM PROCEDURE TRAY) ×1 IMPLANT
PENCIL SMOKE EVACUATOR (MISCELLANEOUS) ×1 IMPLANT
SPONGE T-LAP 4X18 ~~LOC~~+RFID (SPONGE) ×1 IMPLANT
SUT MNCRL AB 4-0 PS2 18 (SUTURE) ×1 IMPLANT
SUT SILK 2 0 SH (SUTURE) IMPLANT
SUT VIC AB 2-0 CT1 27 (SUTURE) ×2
SUT VIC AB 2-0 CT1 TAPERPNT 27 (SUTURE) ×1 IMPLANT
SUT VIC AB 3-0 CT1 27 (SUTURE) ×1
SUT VIC AB 3-0 CT1 TAPERPNT 27 (SUTURE) ×1 IMPLANT
SYR CONTROL 10ML LL (SYRINGE) ×1 IMPLANT
TOWEL OR 17X26 10 PK STRL BLUE (TOWEL DISPOSABLE) ×1 IMPLANT
TUBE CONNECTING 12X1/4 (SUCTIONS) IMPLANT

## 2022-07-08 NOTE — Anesthesia Procedure Notes (Signed)
Procedure Name: LMA Insertion Date/Time: 07/08/2022 10:20 AM  Performed by: Suan Halter, CRNAPre-anesthesia Checklist: Patient identified, Emergency Drugs available, Suction available and Patient being monitored Patient Re-evaluated:Patient Re-evaluated prior to induction Oxygen Delivery Method: Circle system utilized Preoxygenation: Pre-oxygenation with 100% oxygen Induction Type: IV induction Ventilation: Mask ventilation without difficulty LMA: LMA inserted LMA Size: 5.0 Number of attempts: 1 Airway Equipment and Method: Bite block Placement Confirmation: positive ETCO2 Tube secured with: Tape Dental Injury: Teeth and Oropharynx as per pre-operative assessment

## 2022-07-08 NOTE — Transfer of Care (Signed)
Immediate Anesthesia Transfer of Care Note  Patient: [name]  Procedure(s) Performed: Procedure(s) (LRB): OPEN LEFT INGUINAL HERNIA REPAIR WITH MESH (Left)  Patient Location: PACU  Anesthesia Type: General  Level of Consciousness: awake, oriented, sedated and patient cooperative  Airway & Oxygen Therapy: Patient Spontanous Breathing and Patient connected to face mask oxygen  Post-op Assessment: Report given to PACU RN and Post -op Vital signs reviewed and stable  Post vital signs: Reviewed and stable  Complications: No apparent anesthesia complications Last Vitals:  Vitals Value Taken Time  BP 105/70 07/08/22 1101  Temp    Pulse 64 07/08/22 1103  Resp 9 07/08/22 1103  SpO2 99 % 07/08/22 1103  Vitals shown include unvalidated device data.  Last Pain:  Vitals:   07/08/22 1000  TempSrc:   PainSc: 3       Patients Stated Pain Goal: 6 (35/36/14 4315)  Complications: No notable events documented.

## 2022-07-08 NOTE — Anesthesia Postprocedure Evaluation (Signed)
Anesthesia Post Note  Patient: Gorman Safi Marovich  Procedure(s) Performed: OPEN LEFT INGUINAL HERNIA REPAIR WITH MESH (Left: Groin)     Patient location during evaluation: PACU Anesthesia Type: General Level of consciousness: awake and alert Pain management: pain level controlled Vital Signs Assessment: post-procedure vital signs reviewed and stable Respiratory status: spontaneous breathing, nonlabored ventilation and respiratory function stable Cardiovascular status: blood pressure returned to baseline and stable Postop Assessment: no apparent nausea or vomiting Anesthetic complications: no   No notable events documented.  Last Vitals:  Vitals:   07/08/22 1130 07/08/22 1145  BP: 125/63 133/80  Pulse: 69 69  Resp: 13 19  Temp:    SpO2: 100% 100%    Last Pain:  Vitals:   07/08/22 1130  TempSrc:   PainSc: 0-No pain                 Lynda Rainwater

## 2022-07-08 NOTE — Anesthesia Procedure Notes (Signed)
Anesthesia Regional Block: TAP block   Pre-Anesthetic Checklist: , timeout performed,  Correct Patient, Correct Site, Correct Laterality,  Correct Procedure, Correct Position, site marked,  Risks and benefits discussed,  Surgical consent,  Pre-op evaluation,  At surgeon's request and post-op pain management  Laterality: Left  Prep: chloraprep       Needles:  Injection technique: Single-shot  Needle Type: Stimiplex     Needle Length: 9cm  Needle Gauge: 21     Additional Needles:   Procedures:,,,, ultrasound used (permanent image in chart),,    Narrative:  Start time: 07/08/2022 10:02 AM End time: 07/08/2022 10:07 AM Injection made incrementally with aspirations every 5 mL.  Performed by: Personally  Anesthesiologist: Lynda Rainwater, MD

## 2022-07-08 NOTE — Discharge Instructions (Addendum)
[facility] _______[facility]  UMBILICAL OR INGUINAL HERNIA REPAIR: POST OP INSTRUCTIONS  Always review your discharge instruction sheet given to you by the facility where your surgery was performed. IF YOU HAVE DISABILITY OR FAMILY LEAVE FORMS, YOU MUST BRING THEM TO THE OFFICE FOR PROCESSING.   DO NOT GIVE THEM TO YOUR DOCTOR.  1. A  prescription for pain medication may be given to you upon discharge.  Take your pain medication as prescribed, if needed.  If narcotic pain medicine is not needed, then you may take acetaminophen (Tylenol) or ibuprofen (Advil) as needed. 2. Take your usually prescribed medications unless otherwise directed. If you need a refill on your pain medication, please contact your pharmacy.  They will contact our office to request authorization. Prescriptions will not be filled after 5 pm or on week-ends. 3. You should follow a light diet the first 24 hours after arrival home, such as soup and crackers, etc.  Be sure to include lots of fluids daily.  Resume your normal diet the day after surgery. 4.Most patients will experience some swelling and bruising around the umbilicus or in the groin and scrotum.  Ice packs and reclining will help.  Swelling and bruising can take several days to resolve.  6. It is common to experience some constipation if taking pain medication after surgery.  Increasing fluid intake and taking a stool softener (such as Colace) will usually help or prevent this problem from occurring.  A mild laxative (Milk of Magnesia or Miralax) should be taken according to package directions if there are no bowel movements after 48 hours. 7. Unless discharge instructions indicate otherwise, you may remove your bandages 24-48 hours after surgery, and you may shower at that time.  You may have steri-strips (small skin tapes) in place directly over the incision.  These strips should be left on the skin for 7-10 days.  If your surgeon used skin glue on the  incision, you may shower in 24 hours.  The glue will flake off over the next 2-3 weeks.  Any sutures or staples will be removed at the office during your follow-up visit. 8. ACTIVITIES:  You may resume regular (light) daily activities beginning the next day--such as daily self-care, walking, climbing stairs--gradually increasing activities as tolerated.  You may have sexual intercourse when it is comfortable.  Refrain from any heavy lifting or straining until approved by your doctor.  a.You may drive when you are no longer taking prescription pain medication, you can comfortably wear a seatbelt, and you can safely maneuver your car and apply brakes. b.RETURN TO WORK:   _____________________________________________  9.You should see your doctor in the office for a follow-up appointment approximately 2-3 weeks after your surgery.  Make sure that you call for this appointment within a day or two after you arrive home to insure a convenient appointment time. 10.OTHER INSTRUCTIONS: _YOU MAY SHOWER STARTING TOMORROW NO LIFTING MORE THAN 15 TO 20 POUNDS FOR 4-6 WEEKS ICE PACK, TYLENOL, AND IBUPROFEN ALSO FOR PAIN ________________________    _____________________________________  WHEN TO CALL YOUR DOCTOR: Fever over 101.0 Inability to urinate Nausea and/or vomiting Extreme swelling or bruising Continued bleeding from incision. Increased pain, redness, or drainage from the incision  The clinic staff is available to answer your questions during regular business hours.  Please don't hesitate to call and ask to speak to one of the nurses for clinical concerns.  If you have a medical emergency, go to the nearest emergency room or  call 911.  A surgeon from [facility] is always on call at the hospital   [address], [address], [place], [place]  [address] ?  [address] [address], [place], [place]   [address] [phone] ? [phone] ? FAX [phone] Web site:  [email or web address]           No acetaminophen/Tylenol until after 3:30 pm today if needed.   No ibuprofen, Advil, Aleve, Motrin, ketorolac, meloxicam, naproxen, or other NSAIDS until after 4:50 pm today if needed.    Post Anesthesia Home Care Instructions  Activity: Get plenty of rest for the remainder of the day. A responsible individual must stay with you for 24 hours following the procedure.  For the next 24 hours, DO NOT: -Drive a car -Advertising copywriter -Drink alcoholic beverages -Take any medication unless instructed by your physician -Make any legal decisions or sign important papers.  Meals: Start with liquid foods such as gelatin or soup. Progress to regular foods as tolerated. Avoid greasy, spicy, heavy foods. If nausea and/or vomiting occur, drink only clear liquids until the nausea and/or vomiting subsides. Call your physician if vomiting continues.  Special Instructions/Symptoms: Your throat may feel dry or sore from the anesthesia or the breathing tube placed in your throat during surgery. If this causes discomfort, gargle with warm salt water. The discomfort should disappear within 24 hours.     Regional Anesthesia Blocks  1. Numbness or the inability to move the "blocked" extremity may last from 3-48 hours after placement. The length of time depends on the medication injected and your individual response to the medication. If the numbness is not going away after 48 hours, call your surgeon.  2. The extremity that is blocked will need to be protected until the numbness is gone and the  Strength has returned. Because you cannot feel it, you will need to take extra care to avoid injury. Because it may be weak, you may have difficulty moving it or using it. You may not know what position it is in without looking at it while the block is in effect.  3. For blocks in the legs and feet, returning to weight bearing and walking needs to be done carefully. You  will need to wait until the numbness is entirely gone and the strength has returned. You should be able to move your leg and foot normally before you try and bear weight or walk. You will need someone to be with you when you first try to ensure you do not fall and possibly risk injury.  4. Bruising and tenderness at the needle site are common side effects and will resolve in a few days.  5. Persistent numbness or new problems with movement should be communicated to the surgeon or the [facility] [phone] [facility] [phone]).

## 2022-07-08 NOTE — Interval H&P Note (Signed)
History and Physical Interval Note:no change in H and P  07/08/2022 9:39 AM  [name]  has presented today for surgery, with the diagnosis of LEFT INGUINAL HERNIA.  The various methods of treatment have been discussed with the patient and family. After consideration of risks, benefits and other options for treatment, the patient has consented to  Procedure(s) with comments: OPEN LEFT INGUINAL HERNIA REPAIR WITH MESH (Left) - LMA TAP BLOCK as a surgical intervention.  The patient's history has been reviewed, patient examined, no change in status, stable for surgery.  I have reviewed the patient's chart and labs.  Questions were answered to the patient's satisfaction.     Coralie Keens

## 2022-07-08 NOTE — Progress Notes (Signed)
Assisted Dr. Sabra Heck with left, transabdominal plane, ultrasound guided block. Side rails up, monitors on throughout procedure. See vital signs in flow sheet. Tolerated Procedure well.

## 2022-07-08 NOTE — Op Note (Signed)
OPEN LEFT INGUINAL HERNIA REPAIR WITH MESH  Procedure Note  [name] [facility] 07/08/2022   Pre-op Diagnosis: LEFT INGUINAL HERNIA     Post-op Diagnosis: same  Procedure(s): OPEN LEFT INGUINAL HERNIA REPAIR WITH MESH  Surgeon(s): Coralie Keens, MD  Anesthesia: General  Staff:  Circulator: [place] Bing, RN Scrub Person: Mickel Crow Circulator Assistant: Albina Billet, CST  Estimated Blood Loss: Minimal               Findings: The patient was found to have an indirect left inguinal hernia which was repaired with a large piece of Prolene ProGrip mesh from [employer]  Procedure: The patient was brought to the operating room and identifies the correct patient.  He was placed supine on the operating room table and general anesthesia was induced.  His abdomen was prepped and draped in the usual sterile fashion.  I anesthetized the skin in the left inguinal area with Marcaine.  I then made a longitudinal incision with a scalpel.  I dissected down through Scarpa's fascia with electrocautery.  I then identified the external bleak fascia and opened it toward the internal and external rings.  Identify the testicular cord and structures.  He had a indirect hernia sac which I separated from the cord structures.  It contained only fat.  I tied off the base of the sac with a 2-0 silk suture and excised the redundant sac.  The patient's inguinal floor was slightly weakened and I reinforced it with a single 2-0 silk suture.  I next brought a piece of Prolene ProGrip mesh from [employer] onto the field.  I placed the mesh against the pubic tubercle and then brought around the cord structures and internal ring.  I then sutured the mesh in place with 2 separate 2-0 Vicryl sutures.  Wide coverage of the inguinal floor and the internal ring appeared to be achieved.  I closed the external oblique fascia with a running 2-0 Vicryl suture.  I next close carpus fascia with interrupted 3-0 Vicryl sutures  and closed the skin with a 4-0 running Monocryl.  Dermabond was then applied.  The patient tolerated the procedure well.  All the counts were correct at the end of the procedure.  The patient was then extubated in the operating room and taken in a stable condition to the recovery room.          Coralie Keens   Date: 07/08/2022  Time: 10:56 AM

## 2022-07-08 NOTE — Anesthesia Preprocedure Evaluation (Signed)
Anesthesia Evaluation  Patient identified by MRN, date of birth, ID band Patient awake    Reviewed: Allergy & Precautions, NPO status , Patient's Chart, lab work & pertinent test results  Airway Mallampati: II  TM Distance: >3 FB Neck ROM: Full    Dental  (+) Dental Advisory Given, Edentulous Upper, Edentulous Lower   Pulmonary neg pulmonary ROS, former smoker,    Pulmonary exam normal        Cardiovascular negative cardio ROS Normal cardiovascular exam     Neuro/Psych Seizures -,  PSYCHIATRIC DISORDERS Anxiety Depression Bipolar Disorder    GI/Hepatic Neg liver ROS, GERD  ,  Endo/Other  negative endocrine ROS  Renal/GU negative Renal ROS  negative genitourinary   Musculoskeletal negative musculoskeletal ROS (+)   Abdominal Normal abdominal exam  (+)   Peds  Hematology negative hematology ROS (+)   Anesthesia Other Findings   Reproductive/Obstetrics                             Anesthesia Physical  Anesthesia Plan  ASA: 3  Anesthesia Plan: General   Post-op Pain Management: Tylenol PO (pre-op)* and Regional block*   Induction: Intravenous  PONV Risk Score and Plan: 2 and Ondansetron, Midazolam and Treatment may vary due to age or medical condition  Airway Management Planned: LMA  Additional Equipment:   Intra-op Plan:   Post-operative Plan: Extubation in OR  Informed Consent: I have reviewed the patients History and Physical, chart, labs and discussed the procedure including the risks, benefits and alternatives for the proposed anesthesia with the patient or authorized representative who has indicated his/her understanding and acceptance.     Dental advisory given  Plan Discussed with: CRNA and Surgeon  Anesthesia Plan Comments:         Anesthesia Quick Evaluation

## 2022-07-09 ENCOUNTER — Encounter (HOSPITAL_BASED_OUTPATIENT_CLINIC_OR_DEPARTMENT_OTHER): Payer: Self-pay | Admitting: Surgery

## 2022-08-05 DIAGNOSIS — R051 Acute cough: Secondary | ICD-10-CM | POA: Diagnosis not present

## 2022-08-24 DIAGNOSIS — F4325 Adjustment disorder with mixed disturbance of emotions and conduct: Secondary | ICD-10-CM | POA: Diagnosis not present

## 2022-08-28 DIAGNOSIS — M25511 Pain in right shoulder: Secondary | ICD-10-CM | POA: Diagnosis not present

## 2022-09-16 DIAGNOSIS — F4325 Adjustment disorder with mixed disturbance of emotions and conduct: Secondary | ICD-10-CM | POA: Diagnosis not present

## 2022-10-15 DIAGNOSIS — M545 Low back pain, unspecified: Secondary | ICD-10-CM | POA: Diagnosis not present

## 2022-12-01 DIAGNOSIS — M545 Low back pain, unspecified: Secondary | ICD-10-CM | POA: Diagnosis not present

## 2022-12-08 ENCOUNTER — Encounter (HOSPITAL_BASED_OUTPATIENT_CLINIC_OR_DEPARTMENT_OTHER): Payer: Self-pay

## 2022-12-10 DIAGNOSIS — M47816 Spondylosis without myelopathy or radiculopathy, lumbar region: Secondary | ICD-10-CM | POA: Diagnosis not present

## 2022-12-10 DIAGNOSIS — M25511 Pain in right shoulder: Secondary | ICD-10-CM | POA: Diagnosis not present

## 2022-12-19 DIAGNOSIS — M545 Low back pain, unspecified: Secondary | ICD-10-CM | POA: Diagnosis not present

## 2022-12-22 ENCOUNTER — Other Ambulatory Visit (HOSPITAL_COMMUNITY): Payer: Self-pay | Admitting: Psychiatry

## 2022-12-22 DIAGNOSIS — F411 Generalized anxiety disorder: Secondary | ICD-10-CM

## 2022-12-29 ENCOUNTER — Telehealth (INDEPENDENT_AMBULATORY_CARE_PROVIDER_SITE_OTHER): Payer: BC Managed Care – PPO | Admitting: Psychiatry

## 2022-12-29 ENCOUNTER — Encounter (HOSPITAL_COMMUNITY): Payer: Self-pay | Admitting: Psychiatry

## 2022-12-29 DIAGNOSIS — F411 Generalized anxiety disorder: Secondary | ICD-10-CM | POA: Diagnosis not present

## 2022-12-29 MED ORDER — TRAZODONE HCL 100 MG PO TABS: 100.0000 mg | ORAL_TABLET | Freq: Every day | ORAL | 3 refills | Status: DC

## 2022-12-29 MED ORDER — PROPRANOLOL HCL 20 MG PO TABS: 20.0000 mg | ORAL_TABLET | Freq: Three times a day (TID) | ORAL | 3 refills | Status: DC

## 2022-12-29 MED ORDER — HYDROXYZINE HCL 50 MG PO TABS: 50.0000 mg | ORAL_TABLET | Freq: Three times a day (TID) | ORAL | 3 refills | Status: DC | PRN
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
## 2023-01-05 DIAGNOSIS — M5416 Radiculopathy, lumbar region: Secondary | ICD-10-CM | POA: Diagnosis not present

## 2023-01-13 DIAGNOSIS — M5416 Radiculopathy, lumbar region: Secondary | ICD-10-CM | POA: Diagnosis not present

## 2023-01-28 ENCOUNTER — Ambulatory Visit: Payer: BC Managed Care – PPO | Admitting: Internal Medicine

## 2023-02-01 DIAGNOSIS — M5416 Radiculopathy, lumbar region: Secondary | ICD-10-CM | POA: Diagnosis not present

## 2023-02-02 ENCOUNTER — Ambulatory Visit: Payer: BC Managed Care – PPO | Admitting: Internal Medicine

## 2023-02-18 DIAGNOSIS — M5416 Radiculopathy, lumbar region: Secondary | ICD-10-CM | POA: Diagnosis not present

## 2023-02-18 DIAGNOSIS — M5451 Vertebrogenic low back pain: Secondary | ICD-10-CM | POA: Diagnosis not present

## 2023-02-24 DIAGNOSIS — Z6829 Body mass index (BMI) 29.0-29.9, adult: Secondary | ICD-10-CM | POA: Diagnosis not present

## 2023-02-24 DIAGNOSIS — M5416 Radiculopathy, lumbar region: Secondary | ICD-10-CM | POA: Diagnosis not present

## 2023-02-28 ENCOUNTER — Telehealth (HOSPITAL_COMMUNITY): Payer: BC Managed Care – PPO | Admitting: Psychiatry

## 2023-03-01 DIAGNOSIS — Z5181 Encounter for therapeutic drug level monitoring: Secondary | ICD-10-CM | POA: Diagnosis not present

## 2023-03-01 DIAGNOSIS — M5416 Radiculopathy, lumbar region: Secondary | ICD-10-CM | POA: Diagnosis not present

## 2023-03-01 DIAGNOSIS — M545 Low back pain, unspecified: Secondary | ICD-10-CM | POA: Diagnosis not present

## 2023-03-01 DIAGNOSIS — Z79899 Other long term (current) drug therapy: Secondary | ICD-10-CM | POA: Diagnosis not present

## 2023-03-04 ENCOUNTER — Telehealth (INDEPENDENT_AMBULATORY_CARE_PROVIDER_SITE_OTHER): Payer: BC Managed Care – PPO | Admitting: Psychiatry

## 2023-03-04 ENCOUNTER — Encounter (HOSPITAL_COMMUNITY): Payer: Self-pay | Admitting: Psychiatry

## 2023-03-04 DIAGNOSIS — F411 Generalized anxiety disorder: Secondary | ICD-10-CM

## 2023-03-04 MED ORDER — TRAZODONE HCL 100 MG PO TABS: 100.0000 mg | ORAL_TABLET | Freq: Every day | ORAL | 3 refills | Status: DC

## 2023-03-04 MED ORDER — PROPRANOLOL HCL 20 MG PO TABS: 20.0000 mg | ORAL_TABLET | Freq: Three times a day (TID) | ORAL | 3 refills | Status: DC

## 2023-03-04 MED ORDER — HYDROXYZINE HCL 50 MG PO TABS: 50.0000 mg | ORAL_TABLET | Freq: Three times a day (TID) | ORAL | 3 refills | Status: DC | PRN
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
## 2023-03-09 ENCOUNTER — Telehealth (HOSPITAL_COMMUNITY): Payer: Self-pay | Admitting: Psychiatry

## 2023-03-10 NOTE — Telephone Encounter (Signed)
DOT Medical Examiner letter filled out. Forms were copied and placed in patients chart. Patient notified and instructed to pick up forms at his convenience. He endorsed understanding and agreed. No other concerns noted at this time.

## 2023-03-25 DIAGNOSIS — R1032 Left lower quadrant pain: Secondary | ICD-10-CM | POA: Diagnosis not present

## 2023-04-07 DIAGNOSIS — M5126 Other intervertebral disc displacement, lumbar region: Secondary | ICD-10-CM | POA: Diagnosis not present

## 2023-04-07 DIAGNOSIS — M5116 Intervertebral disc disorders with radiculopathy, lumbar region: Secondary | ICD-10-CM | POA: Diagnosis not present

## 2023-04-11 ENCOUNTER — Telehealth (HOSPITAL_BASED_OUTPATIENT_CLINIC_OR_DEPARTMENT_OTHER): Payer: Self-pay | Admitting: *Deleted

## 2023-04-11 NOTE — Telephone Encounter (Signed)
Called to offer colon cancer screening. Pt just had back surgery. Was advised on Cologuard and colonoscopy options. Will call [facility] when he is ready to set up. There are no transportation issues at this time.

## 2023-04-28 DIAGNOSIS — M48061 Spinal stenosis, lumbar region without neurogenic claudication: Secondary | ICD-10-CM | POA: Diagnosis not present

## 2023-04-28 DIAGNOSIS — M5416 Radiculopathy, lumbar region: Secondary | ICD-10-CM | POA: Diagnosis not present

## 2023-04-28 DIAGNOSIS — M5136 Other intervertebral disc degeneration, lumbar region: Secondary | ICD-10-CM | POA: Diagnosis not present

## 2023-05-25 ENCOUNTER — Telehealth (HOSPITAL_COMMUNITY): Payer: BC Managed Care – PPO | Admitting: Psychiatry

## 2023-05-25 ENCOUNTER — Encounter (HOSPITAL_COMMUNITY): Payer: Self-pay

## 2023-05-27 ENCOUNTER — Encounter (HOSPITAL_COMMUNITY): Payer: Self-pay | Admitting: Psychiatry

## 2023-05-27 ENCOUNTER — Telehealth (HOSPITAL_COMMUNITY): Payer: BC Managed Care – PPO | Admitting: Psychiatry

## 2023-05-27 DIAGNOSIS — F411 Generalized anxiety disorder: Secondary | ICD-10-CM

## 2023-05-27 MED ORDER — HYDROXYZINE HCL 50 MG PO TABS: 50.0000 mg | ORAL_TABLET | Freq: Three times a day (TID) | ORAL | 3 refills | Status: DC | PRN

## 2023-05-27 MED ORDER — PROPRANOLOL HCL 20 MG PO TABS: 20.0000 mg | ORAL_TABLET | Freq: Three times a day (TID) | ORAL | 3 refills | Status: DC

## 2023-05-27 MED ORDER — TRAZODONE HCL 100 MG PO TABS: 100.0000 mg | ORAL_TABLET | Freq: Every day | ORAL | 3 refills | Status: DC
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
## 2023-06-02 DIAGNOSIS — M47816 Spondylosis without myelopathy or radiculopathy, lumbar region: Secondary | ICD-10-CM | POA: Diagnosis not present

## 2023-06-09 DIAGNOSIS — M5416 Radiculopathy, lumbar region: Secondary | ICD-10-CM | POA: Diagnosis not present

## 2023-06-28 DIAGNOSIS — M47816 Spondylosis without myelopathy or radiculopathy, lumbar region: Secondary | ICD-10-CM | POA: Diagnosis not present

## 2023-07-29 DEATH — deceased

## 2023-08-12 ENCOUNTER — Telehealth (HOSPITAL_COMMUNITY): Payer: Self-pay | Admitting: Psychiatry

## 2023-09-28 ENCOUNTER — Other Ambulatory Visit (HOSPITAL_COMMUNITY): Payer: Self-pay | Admitting: Psychiatry

## 2023-09-28 DIAGNOSIS — F411 Generalized anxiety disorder: Secondary | ICD-10-CM
